# Patient Record
Sex: Female | Born: 1950 | Race: White | Hispanic: No | Marital: Married | Smoking: Never smoker
Health system: Southern US, Community
[De-identification: ages and names within clinical notes are randomized; demographics above are authoritative.]

## PROBLEM LIST (undated history)

## (undated) DIAGNOSIS — M8580 Other specified disorders of bone density and structure, unspecified site: Secondary | ICD-10-CM

## (undated) DIAGNOSIS — G43909 Migraine, unspecified, not intractable, without status migrainosus: Secondary | ICD-10-CM

## (undated) DIAGNOSIS — D72829 Elevated white blood cell count, unspecified: Secondary | ICD-10-CM

## (undated) DIAGNOSIS — Z85820 Personal history of malignant melanoma of skin: Secondary | ICD-10-CM

## (undated) DIAGNOSIS — F329 Major depressive disorder, single episode, unspecified: Secondary | ICD-10-CM

## (undated) DIAGNOSIS — I8390 Asymptomatic varicose veins of unspecified lower extremity: Secondary | ICD-10-CM

## (undated) DIAGNOSIS — I480 Paroxysmal atrial fibrillation: Secondary | ICD-10-CM

## (undated) DIAGNOSIS — R9439 Abnormal result of other cardiovascular function study: Secondary | ICD-10-CM

## (undated) DIAGNOSIS — I712 Thoracic aortic aneurysm, without rupture: Secondary | ICD-10-CM

## (undated) DIAGNOSIS — F419 Anxiety disorder, unspecified: Secondary | ICD-10-CM

## (undated) DIAGNOSIS — I10 Essential (primary) hypertension: Secondary | ICD-10-CM

## (undated) DIAGNOSIS — F32A Depression, unspecified: Secondary | ICD-10-CM

## (undated) DIAGNOSIS — I7789 Other specified disorders of arteries and arterioles: Secondary | ICD-10-CM

## (undated) DIAGNOSIS — D126 Benign neoplasm of colon, unspecified: Secondary | ICD-10-CM

## (undated) DIAGNOSIS — I351 Nonrheumatic aortic (valve) insufficiency: Secondary | ICD-10-CM

## (undated) DIAGNOSIS — E785 Hyperlipidemia, unspecified: Secondary | ICD-10-CM

## (undated) HISTORY — DX: Hyperlipidemia, unspecified: E78.5

## (undated) HISTORY — DX: Thoracic aortic aneurysm, without rupture: I71.2

## (undated) HISTORY — DX: Benign neoplasm of colon, unspecified: D12.6

## (undated) HISTORY — DX: Other specified disorders of bone density and structure, unspecified site: M85.80

## (undated) HISTORY — DX: Elevated white blood cell count, unspecified: D72.829

## (undated) HISTORY — PX: FEMORAL HERNIA REPAIR: SHX632

## (undated) HISTORY — DX: Anxiety disorder, unspecified: F41.9

## (undated) HISTORY — DX: Personal history of malignant melanoma of skin: Z85.820

## (undated) HISTORY — PX: DILATION AND CURETTAGE OF UTERUS: SHX78

## (undated) HISTORY — DX: Essential (primary) hypertension: I10

## (undated) HISTORY — DX: Depression, unspecified: F32.A

## (undated) HISTORY — PX: OTHER SURGICAL HISTORY: SHX169

## (undated) HISTORY — DX: Migraine, unspecified, not intractable, without status migrainosus: G43.909

## (undated) HISTORY — DX: Asymptomatic varicose veins of unspecified lower extremity: I83.90

## (undated) HISTORY — PX: CHOLECYSTECTOMY: SHX55

## (undated) HISTORY — PX: OVARIAN CYST REMOVAL: SHX89

## (undated) HISTORY — DX: Other specified disorders of arteries and arterioles: I77.89

## (undated) HISTORY — DX: Major depressive disorder, single episode, unspecified: F32.9

## (undated) HISTORY — DX: Paroxysmal atrial fibrillation: I48.0

---

## 2001-03-16 ENCOUNTER — Encounter: Payer: Self-pay | Admitting: Emergency Medicine

## 2001-03-16 ENCOUNTER — Emergency Department (HOSPITAL_COMMUNITY): Admission: EM | Admit: 2001-03-16 | Discharge: 2001-03-16 | Payer: Self-pay | Admitting: Emergency Medicine

## 2001-05-15 HISTORY — PX: MELANOMA EXCISION: SHX5266

## 2001-06-13 ENCOUNTER — Emergency Department (HOSPITAL_COMMUNITY): Admission: EM | Admit: 2001-06-13 | Discharge: 2001-06-13 | Payer: Self-pay | Admitting: Emergency Medicine

## 2002-01-08 ENCOUNTER — Other Ambulatory Visit: Admission: RE | Admit: 2002-01-08 | Discharge: 2002-01-08 | Payer: Self-pay | Admitting: *Deleted

## 2002-04-04 ENCOUNTER — Ambulatory Visit (HOSPITAL_BASED_OUTPATIENT_CLINIC_OR_DEPARTMENT_OTHER): Admission: RE | Admit: 2002-04-04 | Discharge: 2002-04-04 | Payer: Self-pay | Admitting: General Surgery

## 2002-05-26 ENCOUNTER — Ambulatory Visit (HOSPITAL_COMMUNITY): Admission: RE | Admit: 2002-05-26 | Discharge: 2002-05-26 | Payer: Self-pay | Admitting: *Deleted

## 2002-05-26 ENCOUNTER — Encounter (INDEPENDENT_AMBULATORY_CARE_PROVIDER_SITE_OTHER): Payer: Self-pay

## 2003-08-19 ENCOUNTER — Other Ambulatory Visit: Admission: RE | Admit: 2003-08-19 | Discharge: 2003-08-19 | Payer: Self-pay | Admitting: *Deleted

## 2007-10-14 ENCOUNTER — Encounter: Admission: RE | Admit: 2007-10-14 | Discharge: 2007-10-14 | Payer: Self-pay

## 2008-09-17 ENCOUNTER — Encounter: Payer: Self-pay | Admitting: Cardiovascular Disease

## 2009-08-19 ENCOUNTER — Encounter: Payer: Self-pay | Admitting: Cardiovascular Disease

## 2009-08-20 ENCOUNTER — Encounter: Payer: Self-pay | Admitting: Cardiovascular Disease

## 2009-09-07 ENCOUNTER — Encounter: Payer: Self-pay | Admitting: Cardiovascular Disease

## 2009-09-22 ENCOUNTER — Encounter: Payer: Self-pay | Admitting: Cardiovascular Disease

## 2009-10-08 DIAGNOSIS — M949 Disorder of cartilage, unspecified: Secondary | ICD-10-CM

## 2009-10-08 DIAGNOSIS — Z860100 Personal history of colon polyps, unspecified: Secondary | ICD-10-CM | POA: Insufficient documentation

## 2009-10-08 DIAGNOSIS — Z87898 Personal history of other specified conditions: Secondary | ICD-10-CM | POA: Insufficient documentation

## 2009-10-08 DIAGNOSIS — N943 Premenstrual tension syndrome: Secondary | ICD-10-CM | POA: Insufficient documentation

## 2009-10-08 DIAGNOSIS — M899 Disorder of bone, unspecified: Secondary | ICD-10-CM | POA: Insufficient documentation

## 2009-10-08 DIAGNOSIS — K219 Gastro-esophageal reflux disease without esophagitis: Secondary | ICD-10-CM | POA: Insufficient documentation

## 2009-10-08 DIAGNOSIS — F341 Dysthymic disorder: Secondary | ICD-10-CM

## 2009-10-12 ENCOUNTER — Telehealth (INDEPENDENT_AMBULATORY_CARE_PROVIDER_SITE_OTHER): Payer: Self-pay | Admitting: *Deleted

## 2009-10-12 ENCOUNTER — Ambulatory Visit: Payer: Self-pay | Admitting: Cardiovascular Disease

## 2009-10-12 DIAGNOSIS — I351 Nonrheumatic aortic (valve) insufficiency: Secondary | ICD-10-CM | POA: Insufficient documentation

## 2009-10-12 DIAGNOSIS — E785 Hyperlipidemia, unspecified: Secondary | ICD-10-CM

## 2009-10-12 DIAGNOSIS — I359 Nonrheumatic aortic valve disorder, unspecified: Secondary | ICD-10-CM | POA: Insufficient documentation

## 2009-10-12 DIAGNOSIS — I10 Essential (primary) hypertension: Secondary | ICD-10-CM | POA: Insufficient documentation

## 2009-10-12 DIAGNOSIS — E7800 Pure hypercholesterolemia, unspecified: Secondary | ICD-10-CM | POA: Insufficient documentation

## 2009-10-15 ENCOUNTER — Telehealth: Payer: Self-pay | Admitting: Cardiovascular Disease

## 2009-10-28 ENCOUNTER — Telehealth: Payer: Self-pay | Admitting: Cardiovascular Disease

## 2009-11-02 ENCOUNTER — Encounter: Payer: Self-pay | Admitting: Cardiovascular Disease

## 2009-11-02 ENCOUNTER — Ambulatory Visit: Payer: Self-pay

## 2009-11-02 ENCOUNTER — Ambulatory Visit (HOSPITAL_COMMUNITY): Admission: RE | Admit: 2009-11-02 | Discharge: 2009-11-02 | Payer: Self-pay | Admitting: Cardiovascular Disease

## 2009-11-02 ENCOUNTER — Ambulatory Visit: Payer: Self-pay | Admitting: Internal Medicine

## 2009-11-09 ENCOUNTER — Ambulatory Visit: Payer: Self-pay | Admitting: Cardiovascular Disease

## 2009-12-07 ENCOUNTER — Telehealth: Payer: Self-pay | Admitting: Cardiovascular Disease

## 2009-12-13 ENCOUNTER — Telehealth: Payer: Self-pay | Admitting: Cardiovascular Disease

## 2010-03-02 ENCOUNTER — Encounter: Payer: Self-pay | Admitting: Cardiovascular Disease

## 2010-04-13 ENCOUNTER — Telehealth: Payer: Self-pay | Admitting: Cardiovascular Disease

## 2010-06-14 NOTE — Assessment & Plan Note (Signed)
Summary: 1 MONTH ROV.SL   Visit Type:  1 mo f/u Primary Provider:  Merri Brunette  CC:  fatigue....Marland Kitchenno other complaints today.  History of Present Illness: 60 yo WF with history of HTN, hyperlipidemia and mild to moderate aortic valve insufficiency with possible bicuspid aortic valve here today to establish cardiology care. She was followed by Dr. Aleen Campi in the past and was found in 2010 to have the moderate AI. She currently  has no symptoms. She denies chest pain, SOB, dizziness, near syncope or syncope. She underwent a treadmill stress in [facility] and had ST depression at baseline that worsened with exercise. This was in May 2010. She is concerned about this. No further workup was recommended by Dr. Aleen Campi  based on the testing. She had no chest pain during exercise and has had no chest pain since. 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 I ordered an echo at the last visit and she is here today for follow up. Her echo is outlined in detail below. Normal LV systolic function with mild aortic valve prolapse and mild AI. She has had recent leg cramps on Zocor and stopped the Zocor. Her leg cramps improved. We have arranged for her to start Crestor 5 mg at night. She is given samples today. No chest pain, SOB, near syncope or syncope.   Current Medications (verified): 1)  Vitamin D 1000 Unit Tabs (Cholecalciferol) .Marland Kitchen.. 1 Tab Two Times A Day 2)  Zomig 2.5 Mg Tabs (Zolmitriptan) .Marland Kitchen.. 1 Tab As Needed 3)  Aspirin 81 Mg Tbec (Aspirin) .... Take One Tablet By Mouth Daily 4)  Fish Oil 1000 Mg Caps (Omega-3 Fatty Acids) .Marland Kitchen.. 1 Cap Once Daily 5)  Diovan 80 Mg Tabs (Valsartan) .Marland Kitchen.. 1 Tab Once Daily 6)  Multivitamins   Tabs (Multiple Vitamin) .Marland Kitchen.. 1 Tab Once Daily 7)  Calcium-Vitamin D 600-200 Mg-Unit Tabs (Calcium-Vitamin D) .... 3 Tabs Once Daily 8)  Advil 200 Mg Tabs (Ibuprofen) .... As Needed 9)   Tylenol Extra Strength 500 Mg Tabs (Acetaminophen) .... As Needed  Allergies (verified): No Known Drug Allergies  Past History:  Past Medical History: Migraines Anxiety/depression Adenomatous colon polyp Osteopenia Melanoma history followed w/  Dr. Danella Deis Valvular heart disease.Marland KitchenMarland KitchenMild to moderate aortic insufficiency with possible bicuspid aortic valve Vericose veins Mild Leukocytosis HTN Hyperlipidemia Mild aortic valve insufficiency  Social History: Reviewed history from 10/12/2009 and no changes required. Full Time-interior Estate agent, freelance Married, 1 daughter Tobacco Use - No.  Alcohol Use - yes, 1 glass wine per week Regular Exercise - yes, walking, yoga, weight lifting Drug Use - no  Review of Systems       The patient complains of fatigue.  The patient denies malaise, fever, weight gain/loss, vision loss, decreased hearing, hoarseness, chest pain, palpitations, shortness of breath, prolonged cough, wheezing, sleep apnea, coughing up blood, abdominal pain, blood in stool, nausea, vomiting, diarrhea, heartburn, incontinence, blood in urine, muscle weakness, joint pain, leg swelling, rash, skin lesions, headache, fainting, dizziness, depression, anxiety, enlarged lymph nodes, easy bruising or bleeding, and environmental allergies.    Vital Signs:  Patient profile:   60 year old female Height:      69 inches Weight:      153 pounds BMI:     22.68 Pulse rate:   76 / minute Pulse rhythm:   regular BP sitting:   126 / 74  (left arm) Cuff size:   regular  Vitals Entered By: Danielle Rankin, CMA (November 09, 2009 12:14 PM)  Physical Exam  General:  General: Well developed, well nourished, NAD Musculoskeletal: Muscle strength 5/5 all ext Psychiatric: Mood and affect normal Neck: No JVD, no carotid bruits, no thyromegaly, no lymphadenopathy. Lungs:Clear bilaterally, no wheezes, rhonci, crackles CV: RRR no murmurs, gallops rubs Abdomen: soft, NT, ND, BS  present Extremities: No edema, pulses 2+.    Echocardiogram  Procedure date:  11/02/2009  Findings:      - Left ventricle: The cavity size was normal. Wall thickness was       normal. Systolic function was normal. The estimated ejection       fraction was in the range of 60% to 65%. Features are consistent       with a pseudonormal left ventricular filling pattern, with       concomitant abnormal relaxation and increased filling pressure       (grade 2 diastolic dysfunction).     - Aortic valve: Mild prolapse. Mild regurgitation.     - Mitral valve: Mild prolapse, involving the posterior leaflet.     - Left atrium: The atrium was mildly dilated.  Impression & Recommendations:  Problem # 1:  AORTIC INSUFFICIENCY (ICD-424.1) Mild AI by echo. Valve appears tricuspid. Repeat in one year. I spent 20 minutes today discussing the nature of AI and potential for progression. She has multiple questions.   Her updated medication list for this problem includes:    Diovan 80 Mg Tabs (Valsartan) .Marland Kitchen... 1 tab once daily  Problem # 2:  HYPERLIPIDEMIA-MIXED (ICD-272.4) Well controlled before stopping Zocor. Will try Crestor 5 mg at night.(samples given today).  If she has legs cramps, will stop. Check FLP, LFTs in 12 weeks.   Problem # 3:  HYPERTENSION, BENIGN (ICD-401.1) Well controlled on current therapy.   Her updated medication list for this problem includes:    Aspirin 81 Mg Tbec (Aspirin) .Marland Kitchen... Take one tablet by mouth daily    Diovan 80 Mg Tabs (Valsartan) .Marland Kitchen... 1 tab once daily  Patient Instructions: 1)  Your physician recommends that you schedule a follow-up appointment in: 1 year 2)  Your physician recommends that you return for a FASTING lipid profile and liver profile in 12 weeks. 3)  Your physician has requested that you have an echocardiogram.  Echocardiography is a painless test that uses sound waves to create images of your heart. It provides your doctor with information about  the size and shape of your heart and how well your heart's chambers and valves are working.  This procedure takes approximately one hour. There are no restrictions for this procedure. To be done in 1 year--1-2 weeks prior to appt. with Dr.Kerisha Goughnour

## 2010-06-14 NOTE — Progress Notes (Signed)
Summary: sided effect of crestor  Phone Note Call from Patient Call back at [facility] Phone [phone]   Caller: Patient Reason for Call: Talk to Nurse Summary of Call: just started CRESTOR 5 MG TABS 1 tab at bedtime.  c/o sided effect rash, not up to par.  Initial call taken by: [name],  December 13, 2009 11:09 AM  Follow-up for Phone Call        broke out in rash (inside of knees and elbow)  started 5 days ago.  Started 4 weeks after starting Crestor.  Rash no worse or better over the last couple of days just stays the same. Told pt she can hold the Crestor for several days and see if the rash gets any better.  She will call us back and let us know if the rash gets any better with holding Crestor. Dennis Bast, RN, BSN  December 13, 2009 12:23 PM  Additional Follow-up for Phone Call Additional follow up Details #1::        Agree. Dennie Bible, can we follow up with her by phone later this week to see how she is? cdm Additional Follow-up by: Verne Carrow, MD,  December 14, 2009 8:39 AM    Additional Follow-up for Phone Call Additional follow up Details #2::    left message to call back Dossie Arbour, RN, BSN  December 16, 2009 4:22 PM Spoke with pt. Rash has improved slightly--is on legs,calves and top of thighs. No other medicine changes recently.  She has appt with Dr. Elenor Quinones MD) on Monday to have this evaluated. I asked her to continue to hold Crestor and let us know outcome of appt with Dr. Dalene Carrow, RN, BSN  December 17, 2009 12:28 PM Received call from pt. She saw Dr. Renne Crigler today. Rash continuing to improve and Crestor was stopped and she was started on Lipitor 5 mg by mouth daily.  She will  keep appt with [place] for lipid and liver profile on 02/08/10

## 2010-06-14 NOTE — Letter (Signed)
Summary: [facility] Office Note  [facility] Office Note   Imported By: Roderic Ovens 10/18/2009 11:51:25  _____________________________________________________________________  External Attachment:    Type:   Image     Comment:   External Document

## 2010-06-14 NOTE — Progress Notes (Signed)
Summary: [facility] Exercise [place] Test   [facility] Exercise [place] Test   Imported By: Roderic Ovens 10/18/2009 11:46:36  _____________________________________________________________________  External Attachment:    Type:   Image     Comment:   External Document

## 2010-06-14 NOTE — Progress Notes (Signed)
Summary: lab result from dr. Renne Crigler  Phone Note Call from Patient Call back at Home Phone [phone]   Caller: Patient Reason for Call: Talk to Nurse Summary of Call: have you receive the lab results from dr. Renne Crigler - wants to discuss.  Initial call taken by: [name],  April 13, 2010 9:53 AM  Follow-up for Phone Call        Pt. wanted to know if we recieved lab results from [facility]. I informed her that we did & Dr. Clifton James did and the results look good. [name] RN  April 13, 2010 10:16 AM  Follow-up by: [name] RN,  April 13, 2010 10:16 AM

## 2010-06-14 NOTE — Progress Notes (Signed)
Summary: Calling regarding Simvastatin  Phone Note Call from Patient Call back at Home Phone [phone]   Caller: Patient Summary of Call: Pt calling regarding taking Simvastatin Initial call taken by: Judie Grieve,  October 28, 2009 11:11 AM  Follow-up for Phone Call        Pt. reports leg pain has improved since stopping simvastatin. Has been on simvastatin for a year and has not been on any other statins.  Will discuss with Dr. Clifton James. Dossie Arbour, RN, BSN  October 28, 2009 11:36 AM   Additional Follow-up for Phone Call Additional follow up Details #1::        We can try Crestor 5 mg per day. We have samples she can try. cdm Additional Follow-up by: Verne Carrow, MD,  October 28, 2009 4:36 PM    Additional Follow-up for Phone Call Additional follow up Details #2::    Per Dr. Clifton James will also need lipid and liver profile in 12 weeks. Left message for pt to call back Dossie Arbour, RN, BSN  October 28, 2009 5:54 PM  pt returning call, [phone] [address]  November 03, 2009 10:30 AM   pt aware, samples will be left for pt at front desk if she tolerates med ok will call back to get new prescription adn sch labwork Meredith Staggers, RN  November 03, 2009 11:26 AM

## 2010-06-14 NOTE — Progress Notes (Signed)
Summary: side effect to meds-simvastin  Phone Note Call from Patient Call back at Home Phone [phone]   Caller: Patient Reason for Call: Talk to Nurse Summary of Call: c/o side effect to simvastin . muscle ache in both legs.  Initial call taken by: [name],  October 15, 2009 9:56 AM  Follow-up for Phone Call        discussed with pt by telephone--she states she has been having some pain in her legs from her knees down to her feet-she states she talked with Dr Clifton James about this a little at time of OV this week but she told him she did not think it was the Simvastatin-she thought it might be from [place] has been on Simvastatin since August-the leg pain  bothered her so much last night she had a hard time sleeping--she has reconsidered and thinks the leg pain may be related to Simvastatin--she will hold Simvastatin 7-10 days and will call back either 6-10 or 6-13 to report how she is feeling and if her leg pain has resolved--We discussed the importance of following up after holding the Simvastatin so Dr Clifton James could make a recommendation about alternate treatment -I will forward to Dr Clifton James for review    New/Updated Medications: * SIMVASTATIN 10 MG TABS (SIMVASTATIN)ON HOLD 10-15-09 1 tab at bedtime

## 2010-06-14 NOTE — Progress Notes (Signed)
Summary: refill request  Phone Note Call from Patient   Caller: Patient Reason for Call: Talk to Nurse Summary of Call: pt finished sample pack of crestor, wants rx at [facility] Initial call taken by: Glynda Jaeger,  December 07, 2009 2:42 PM    New/Updated Medications: CRESTOR 5 MG TABS (ROSUVASTATIN CALCIUM) 1 tab at bedtime Prescriptions: CRESTOR 5 MG TABS (ROSUVASTATIN CALCIUM) 1 tab at bedtime  #30 x 11   Entered by:   Danielle Rankin, CMA   Authorized by:   Verne Carrow, MD   Signed by:   Danielle Rankin, CMA on 12/07/2009   Method used:   Electronically to        [facility]. [phone]* (retail)       [id number]       [place], [place]  [address]       Ph: [phone]       Fax: [phone]   RxID:   [id number]

## 2010-06-14 NOTE — Progress Notes (Signed)
  Recieved Records from [facility] on Pt..took to [employer]  Oct 12, 2009 1:15 PM

## 2010-06-14 NOTE — Assessment & Plan Note (Signed)
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  History of Present Illness: 60 yo WF with history of HTN, hyperlipidemia and mild to moderate aortic valve insufficiency with possible bicuspid aortic valve here today to establish cardiology care. She was followed by Dr. Aleen Campi in the past and was found in 2010 to have the moderate AI. She currently  has no symptoms. She denies chest pain, SOB, dizziness, near syncope or syncope. She underwent a treadmill stress in [facility] and had ST depression at baseline that worsened with exercise. This was in May 2010. She is concerned about this. No further workup was recommended by Dr. Aleen Campi  based on the testing. She had no chest pain during exercise and has had no chest pain since.   Old records reviewed. LDL 83 (down from 142 on Zocor). HDL 73, Total chol 174.  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   Current Medications (verified): 1)  Vitamin D 1000 Unit Tabs (Cholecalciferol) .Marland Kitchen.. 1 Tab Two Times A Day 2)  Zomig 2.5 Mg Tabs (Zolmitriptan) .Marland Kitchen.. 1 Tab As Needed 3)  Aspirin 81 Mg Tbec (Aspirin) .... Take One Tablet By Mouth Daily 4)  Fish Oil 1000 Mg Caps (Omega-3 Fatty Acids) .Marland Kitchen.. 1 Cap Once Daily 5)  Diovan 80 Mg Tabs (Valsartan) .Marland Kitchen.. 1 Tab Once Daily 6)  Simvastatin 10 Mg Tabs (Simvastatin) .Marland Kitchen.. 1 Tab At Bedtime 7)  Multivitamins   Tabs (Multiple Vitamin) .Marland Kitchen.. 1 Tab Once Daily 8)  Calcium-Vitamin D 600-200 Mg-Unit Tabs (Calcium-Vitamin D) .... 3 Tabs Once Daily 9)  Advil 200 Mg Tabs (Ibuprofen) .... As Needed 10)  Tylenol Extra Strength 500 Mg Tabs (Acetaminophen) .... As Needed  Allergies (verified): No Known Drug Allergies  Past History:  Past Medical  History: Migraines Anxiety/depression Adenomatous colon polyp Osteopenia Melanoma history followed w/  Dr. Danella Deis Valvular heart disease.Marland KitchenMarland KitchenMild to moderate aortic insufficiency with possible bicuspid aortic valve Vericose veins Mild Leukocytosis HTN Hyperlipidemia  Past Surgical History:  Dilation and curettage.. High endocervical polyp removed Cholecystectomy Herniorrhaphy Ovarian cyst removal  BTL Melanoma removal  Family History: Family History of Hypertension: Mother, father Mother: Alive, age 83. Migraines Father deceased age 52 lung cancer Siblings: 2 sisters alive and well, both younger  Social History: Estate agent, freelance Married, 1 daughter Tobacco Use - No.  Alcohol Use - yes, 1 glass wine per week Regular Exercise - yes, walking, yoga, weight lifting Drug Use - no  Review of Systems  The patient denies fatigue, malaise, fever, weight gain/loss, vision loss, decreased hearing, hoarseness, chest pain, palpitations, shortness of breath, prolonged cough, wheezing, sleep apnea, coughing up blood, abdominal pain, blood in stool, nausea, vomiting, diarrhea, heartburn, incontinence, blood in urine, muscle weakness, joint pain, leg swelling, rash, skin lesions, headache, fainting, dizziness, depression, anxiety, enlarged lymph nodes, easy bruising or bleeding, and environmental allergies.    Vital Signs:  Patient profile:   60 year old female Height:      69 inches Weight:      155 pounds BMI:     22.97 Pulse rate:   81 / minute Pulse rhythm:   irregular BP sitting:   122 / 84  (left arm) Cuff size:   large  Vitals Entered By: Danielle Rankin, CMA (Oct 12, 2009 3:14  PM)  Physical Exam  General:  General: Well developed, well nourished, NAD HEENT: OP clear, mucus membranes moist SKIN: warm, dry Neuro: No focal deficits Musculoskeletal: Muscle strength 5/5 all ext Psychiatric: Mood and affect normal Neck: No JVD, no carotid bruits, no  thyromegaly, no lymphadenopathy. Lungs:Clear bilaterally, no wheezes, rhonci, crackles CV: RRR no murmurs, gallops rubs Abdomen: soft, NT, ND, BS present Extremities: No edema, pulses 2+.    EKG  Procedure date:  10/12/2009  Findings:      NSR, rate 81 bpm. Possible right atrial enlargement. Poor R wave progression through precordial leads.   Impression & Recommendations:  Problem # 1:  AORTIC INSUFFICIENCY (ICD-424.1) Moderate by echo May 2010 in Dr. Star Age office. No loud diastolic murmur. Will repeat echo to assess.   Her updated medication list for this problem includes:    Diovan 80 Mg Tabs (Valsartan) .Marland Kitchen... 1 tab once daily  Orders: EKG w/ Interpretation (93000) Echocardiogram (Echo)  Problem # 2:  HYPERTENSION, BENIGN (ICD-401.1) Well controlled. Continue current therapy.   Her updated medication list for this problem includes:    Aspirin 81 Mg Tbec (Aspirin) .Marland Kitchen... Take one tablet by mouth daily    Diovan 80 Mg Tabs (Valsartan) .Marland Kitchen... 1 tab once daily  Problem # 3:  HYPERLIPIDEMIA-MIXED (ICD-272.4) Well controlled. See HPI for latest results.   Her updated medication list for this problem includes:    Simvastatin 10 Mg Tabs (Simvastatin) .Marland Kitchen... 1 tab at bedtime  Patient Instructions: 1)  Your physician recommends that you schedule a follow-up appointment in: 3 weeks 2)  Your physician has requested that you have an echocardiogram.  Echocardiography is a painless test that uses sound waves to create images of your heart. It provides your doctor with information about the size and shape of your heart and how well your heart's chambers and valves are working.  This procedure takes approximately one hour. There are no restrictions for this procedure.

## 2010-09-30 NOTE — Op Note (Signed)
NAME:  Kristine Jones, Kristine Jones                      ACCOUNT NO.:  [id number]   MEDICAL RECORD NO.:  [id number]                   PATIENT TYPE:  AMB   LOCATION:  DAY                                  FACILITY:  [facility]   PHYSICIAN:  Pershing Cox, M.D.            DATE OF BIRTH:  November 17, 1950   DATE OF PROCEDURE:  05/26/2002  DATE OF DISCHARGE:                                 OPERATIVE REPORT   PREOPERATIVE DIAGNOSIS:  High endocervical polyp.   POSTOPERATIVE DIAGNOSIS:  High endocervical polyp.   PROCEDURE:  1. Exam under anesthesia.  2. Dilation and curettage and diagnostic hysteroscopy.   ANESTHESIA:  General endotracheal.   SURGEON:  Pershing Cox, M.D.   INDICATIONS FOR PROCEDURE:  Kristine Jones was seen for her annual  examination in August.  She then returned to our office in November.  In the  interim, she had had a melanoma excised and was extremely anxious.  I  attempted to remove the endocervical polyp under colposcopic guidance, but  the polyp was higher in the endocervix than could be removed.  I recommended  that she have a D&C hysteroscopy.  She is scheduled today for this  procedure.   FINDINGS:  Endocervical polyp is visualized in the low endocervix but  arising from the higher endocervix.  The endometrial canal sounds 9.75  inches.  Both ostia were visualized.  There was a small amount of tissue  seen in the upper fundus of the uterus.   DESCRIPTION OF PROCEDURE:  Kristine Jones was brought to the operating  room with an IV in place.  She received 1 g of Ancef in the holding area.  She was taken to the operating room where she received sedation by  intravenous route.  She was heavily sedated before she was placed into Allen  stirrups.  The patient's perineum, abdomen, and vagina were prepped with a  solution of Hibiclens.  The bladder was emptied using sterile technique and  a red rubber catheter.  The exam under anesthesia showed the uterus to be  approximately 8-10 weeks in size and midplane.  No adnexal masses.   The patient was draped for a sterile vaginal procedure.  Bivalve speculum  was placed in the vagina.  Marcaine 25% was injected into the anterior  cervix which was then grasped with a single-tooth tenaculum.  Marcaine  paracervical block was administered to the 3, 4, 7, and 8 positions, using a  total volume of 12 cubic centimeters of the solution.  Endocervical  curettings were obtained using a box curette.  Attempt to remove the polyp  was unsuccessful using polyp forceps.  The cervix was dilated with Pratt  dilators to size 25.  The hysteroscope was introduced into the endometrial  cavity, and the endometrial contents were visualized and photographs were  taken.   The hysteroscope was removed, and the endocervical canal was vigorously  curetted using a serrated Meigs curette.  The polyp was retrieved using  this, and this was confirmed by using polyp forceps.  A small sharp curette  was used to curette the endometrial curettings from all surfaces of the  endometrium.  These were collected onto Telfa.  Hysteroscope was then  introduced into the cavity again to verify the total removal of the cervical  polyp.  Once this  was confirmed, the procedure was terminated.  Specimens include endocervical  curettings, endometrial curettings, and cervical polyp.  Complications none.  Deficit was less than 50 cubic centimeters.  The patient tolerated the  procedure well.                                               Pershing Cox, M.D.    MAJ/MEDQ  D:  05/26/2002  T:  05/26/2002  Job:  [id number]   cc:   Soyla Murphy. Renne Crigler, M.D.  [address]  [place]  [place] [address]  Fax: [phone]

## 2010-10-20 ENCOUNTER — Encounter: Payer: Self-pay | Admitting: Cardiovascular Disease

## 2010-10-27 ENCOUNTER — Encounter: Payer: Self-pay | Admitting: Cardiology

## 2010-11-09 ENCOUNTER — Telehealth: Payer: Self-pay | Admitting: Cardiovascular Disease

## 2010-11-09 DIAGNOSIS — I351 Nonrheumatic aortic (valve) insufficiency: Secondary | ICD-10-CM

## 2010-11-09 NOTE — Telephone Encounter (Signed)
Pt calling re needing en echo before next  appt or not

## 2010-11-09 NOTE — Telephone Encounter (Signed)
Dr Gibson Ramp note 11/09/09 indicates pt should have echo in 1 year  1-2 weeks prior to appt with him in 1 year. I will forward this to Glynda Jaeger to schedule.  Echo order has been placed in EPIC.

## 2010-11-15 ENCOUNTER — Ambulatory Visit (HOSPITAL_COMMUNITY): Payer: BC Managed Care – PPO | Attending: Cardiovascular Disease | Admitting: Radiology

## 2010-11-15 DIAGNOSIS — I079 Rheumatic tricuspid valve disease, unspecified: Secondary | ICD-10-CM | POA: Insufficient documentation

## 2010-11-15 DIAGNOSIS — E785 Hyperlipidemia, unspecified: Secondary | ICD-10-CM | POA: Insufficient documentation

## 2010-11-15 DIAGNOSIS — I10 Essential (primary) hypertension: Secondary | ICD-10-CM | POA: Insufficient documentation

## 2010-11-15 DIAGNOSIS — I379 Nonrheumatic pulmonary valve disorder, unspecified: Secondary | ICD-10-CM | POA: Insufficient documentation

## 2010-11-15 DIAGNOSIS — I080 Rheumatic disorders of both mitral and aortic valves: Secondary | ICD-10-CM | POA: Insufficient documentation

## 2010-11-15 DIAGNOSIS — I351 Nonrheumatic aortic (valve) insufficiency: Secondary | ICD-10-CM

## 2010-11-15 DIAGNOSIS — I359 Nonrheumatic aortic valve disorder, unspecified: Secondary | ICD-10-CM

## 2010-11-24 ENCOUNTER — Ambulatory Visit (INDEPENDENT_AMBULATORY_CARE_PROVIDER_SITE_OTHER): Payer: BC Managed Care – PPO | Admitting: Cardiovascular Disease

## 2010-11-24 ENCOUNTER — Encounter: Payer: Self-pay | Admitting: Cardiovascular Disease

## 2010-11-24 VITALS — BP 154/92 | HR 67 | Ht 69.0 in | Wt 157.0 lb

## 2010-11-24 DIAGNOSIS — I359 Nonrheumatic aortic valve disorder, unspecified: Secondary | ICD-10-CM

## 2010-11-24 NOTE — Progress Notes (Signed)
History of Present Illness:59 yo WF with history of HTN, hyperlipidemia and mild to moderate aortic valve insufficiency with possible bicuspid aortic valve here today for cardiac follow up. She was followed by Dr. Aleen Campi in the past and was found in 2010 to have the moderate AI. I saw her as a new pt in 2011. She currently  has no symptoms. She denies chest pain, SOB, dizziness, near syncope or syncope. She underwent a treadmill stress in [facility] and had ST depression at baseline that worsened with exercise. This was in May 2010. She was concerned about this. No further workup was recommended by Dr. Aleen Campi  based on the testing. She had no chest pain during exercise and has had no chest pain since. 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 Echo in our office 2011 with normal LV systolic function with mild aortic valve prolapse and mild AI. She has had recent leg cramps on Zocor and stopped the Zocor. Her leg cramps improved. We have arranged for her to start Crestor 5 mg at night. She did not tolerate this but is now on Lipitor now. Echo last week with stable mild to moderate AI, normal LV size and function.   She has been doing well. No chest pain, SOB. Occasional palpitations.   Dr. Renne Crigler is her primary care physician. Last LDL of 93, HDL 91, Total chol 195 in June 2012.   Past Medical History  Diagnosis Date  . Migraines   . Anxiety and depression   . Adenomatous colon polyp   . Osteopenia   . History of melanoma     Followed with Dr. Danella Deis  . Valvular heart disease     Mild to moderate aortic insufficiency with possible bicuspid aortic valve  . Varicose veins   . Leukocytosis     Mild  . HTN (hypertension)   . Hyperlipidemia   . Aortic valve insufficiency     Mild    Past Surgical History  Procedure Date  . Dilation and curettage of uterus   . High endocervical polyp  removed   . Cholecystectomy   . Femoral hernia repair   . Ovarian cyst removal   . Btl   . Melanoma excision     Current Outpatient Prescriptions  Medication Sig Dispense Refill  . acetaminophen (TYLENOL) 500 MG tablet Take 500 mg by mouth as needed.        Marland Kitchen aspirin 81 MG EC tablet Take 81 mg by mouth daily.        . Calcium Carbonate-Vitamin D (CALCIUM-VITAMIN D3) 600-200 MG-UNIT TABS Take 3 tablets by mouth daily.        . Cholecalciferol (VITAMIN D) 1000 UNITS capsule Take 1,000 Units by mouth daily.       Marland Kitchen ESTRACE VAGINAL 0.1 MG/GM vaginal cream as needed.      . fish oil-omega-3 fatty acids 1000 MG capsule Take 1 g by mouth daily.        Marland Kitchen ibuprofen (ADVIL,MOTRIN) 200 MG tablet Take 200 mg by mouth as needed.        Marland Kitchen LIPITOR 10 MG tablet TAKE ONE HALF TABLET DAILY      . multivitamin (THERAGRAN) per tablet Take 1 tablet by mouth daily.        . valsartan (DIOVAN) 160 MG tablet Take 80 mg by mouth daily.        Marland Kitchen ZOLMitriptan (ZOMIG) 2.5 MG  tablet Take 2.5 mg by mouth as needed.          No Known Allergies  History   Social History  . Marital Status: Married    Spouse Name: N/A    Number of Children: N/A  . 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 Smoking status: Never Smoker   . Smokeless tobacco: Not on file  . Alcohol Use: 0.5 oz/week    1 drink(s) per week     1 glass of wine per week  . Drug Use: No  . Sexually Active: Not on file   Other Topics Concern  . Not on file   Social History Narrative   Married1 daughterGets regular exercise: Walking, yoga, weight lifting    Family History  Problem Relation Age of Onset  . Hypertension Mother   . Migraines Mother   . Hypertension Father   . Lung cancer Father     Review of Systems:  As stated in the HPI and otherwise negative.   BP 154/92  Pulse 67  Ht 5\' 9"  (1.753 m)  Wt 157 lb (71.215 kg)  BMI 23.18 kg/m2  Physical  Examination: General: Well developed, well nourished, NAD HEENT: OP clear, mucus membranes moist SKIN: warm, dry. No rashes. Neuro: No focal deficits Musculoskeletal: Muscle strength 5/5 all ext Psychiatric: Mood and affect normal Neck: No JVD, no carotid bruits, no thyromegaly, no lymphadenopathy. Lungs:Clear bilaterally, no wheezes, rhonci, crackles Cardiovascular: Regular rate and rhythm. No murmurs, gallops or rubs. Abdomen:Soft. Bowel sounds present. Non-tender.  Extremities: No lower extremity edema. Pulses are 2 + in the bilateral DP/PT.  EKG:NSR, rate 67 bpm.   Echo: 11/19/10:  Left ventricle: The cavity size was normal. Wall thickness was normal. Systolic function was normal. The estimated ejection fraction was in the range of 60% to 65%. Wall motion was normal; there were no regional wall motion abnormalities. Doppler parameters are consistent with abnormal left ventricular relaxation (grade 1 diastolic dysfunction). - Aortic valve: Mild to moderate regurgitation. - Mitral valve: Mild prolapse, involving the posterior leaflet. Mild regurgitation.

## 2010-11-24 NOTE — Assessment & Plan Note (Signed)
Stable. Recent echo with Mild to Moderate AI. Continue ARB. No other changes. Repeat echo in two years.

## 2010-11-24 NOTE — Patient Instructions (Signed)
Your physician wants you to follow-up in: 12 months with Dr. McAlhany.  You will receive a reminder letter in the mail two months in advance. If you don't receive a letter, please call our office to schedule the follow-up appointment.   

## 2011-02-22 ENCOUNTER — Telehealth: Payer: Self-pay | Admitting: Cardiovascular Disease

## 2011-02-22 NOTE — Telephone Encounter (Signed)
Pt calling re question on med °

## 2011-02-22 NOTE — Telephone Encounter (Signed)
Spoke with pt who is asking if change from Lipitor to generic atorvastatin may have affected last lipid profile results.  I told her that the change to generic should not have affected results.

## 2011-04-19 ENCOUNTER — Encounter: Payer: Self-pay | Admitting: Cardiovascular Disease

## 2011-11-22 ENCOUNTER — Telehealth: Payer: Self-pay | Admitting: Cardiovascular Disease

## 2011-11-22 NOTE — Telephone Encounter (Signed)
Patient would like to know if she should have an echo before yrly  Appnt.

## 2011-11-22 NOTE — Telephone Encounter (Signed)
I spoke with pt and told her she would not need echo prior to appt in August.  Last echo done in July 2012 with follow up planned in 2 years

## 2011-12-29 ENCOUNTER — Encounter: Payer: Self-pay | Admitting: Cardiovascular Disease

## 2011-12-29 ENCOUNTER — Ambulatory Visit (INDEPENDENT_AMBULATORY_CARE_PROVIDER_SITE_OTHER): Payer: BC Managed Care – PPO | Admitting: Cardiovascular Disease

## 2011-12-29 VITALS — BP 130/76 | HR 76 | Ht 69.0 in | Wt 159.0 lb

## 2011-12-29 DIAGNOSIS — I359 Nonrheumatic aortic valve disorder, unspecified: Secondary | ICD-10-CM

## 2011-12-29 DIAGNOSIS — I351 Nonrheumatic aortic (valve) insufficiency: Secondary | ICD-10-CM

## 2011-12-29 NOTE — Assessment & Plan Note (Signed)
She has mild to moderate AI by echo August 2012 with normal LV size and function. Will repeat echo in January 2014. No symptoms. BP well controlled.

## 2011-12-29 NOTE — Patient Instructions (Addendum)
Your physician wants you to follow-up in: 12 months. You will receive a reminder letter in the mail two months in advance. If you don't receive a letter, please call our office to schedule the follow-up appointment.  Your physician has requested that you have an echocardiogram. Echocardiography is a painless test that uses sound waves to create images of your heart. It provides your doctor with information about the size and shape of your heart and how well your heart's chambers and valves are working. This procedure takes approximately one hour. There are no restrictions for this procedure. To be done in January 2014

## 2011-12-29 NOTE — Progress Notes (Signed)
History of Present Illness: 61 yo WF with history of HTN, hyperlipidemia and mild to moderate aortic valve insufficiency with possible bicuspid aortic valve here today for cardiac follow up. She was followed by Dr. Aleen Campi in the past and was found in 2010 to have the moderate AI. I saw her as a new pt in 2011. She has had no symptoms.  She underwent a treadmill stress in [facility] and had ST depression at baseline that worsened with exercise. This was in May 2010. She was concerned about this. No further workup was recommended by Dr. Aleen Campi based on the testing. She had no chest pain during exercise and has had no chest pain since. She did not tolerate Zocor or Crestor but she has tolerated Lipitor. Echo August 2012  with stable mild to moderate AI, normal LV size and function, mild MR. Her diovan was stopped because she read   She has been doing well. No chest pain, SOB or palpitations.   Dr. Renne Crigler is her primary care physician. Lipids are followed in primary care. Last lipids 10/26/11 Total chol: 182, HDL: 80, LDL: 87.   Past Medical History  Diagnosis Date  . Migraines   . Anxiety and depression   . Adenomatous colon polyp   . Osteopenia   . History of melanoma     Followed with Dr. Danella Deis  . Valvular heart disease     Mild to moderate aortic insufficiency with possible bicuspid aortic valve  . Varicose veins   . Leukocytosis     Mild  . HTN (hypertension)   . Hyperlipidemia   . Aortic valve insufficiency     Mild    Past Surgical History  Procedure Date  . Dilation and curettage of uterus   . High endocervical polyp removed   . Cholecystectomy   . Femoral hernia repair   . Ovarian cyst removal   . Btl   . Melanoma excision     Current Outpatient Prescriptions  Medication Sig Dispense Refill  . acetaminophen (TYLENOL) 500 MG tablet Take 500 mg by mouth as needed.        Marland Kitchen aspirin 81 MG EC tablet Take 81 mg by mouth daily.        . Calcium  Carbonate-Vitamin D (CALCIUM-VITAMIN D3) 600-200 MG-UNIT TABS Take 3 tablets by mouth daily.        . Cholecalciferol (VITAMIN D) 1000 UNITS capsule Take 1,000 Units by mouth daily.       Marland Kitchen ESTRACE VAGINAL 0.1 MG/GM vaginal cream as needed.      . fish oil-omega-3 fatty acids 1000 MG capsule Take 1 g by mouth daily.        Marland Kitchen ibuprofen (ADVIL,MOTRIN) 200 MG tablet Take 200 mg by mouth as needed.        Marland Kitchen LIPITOR 10 MG tablet TAKE ONE HALF TABLET DAILY      . multivitamin (THERAGRAN) per tablet Take 1 tablet by mouth daily.        Marland Kitchen triamterene-hydrochlorothiazide (DYAZIDE) 37.5-25 MG per capsule 1 tab daily      . ZOLMitriptan (ZOMIG) 2.5 MG tablet Take 2.5 mg by mouth as needed.          No Known Allergies  History   Social History  . Marital Status: Married    Spouse Name: N/A    Number of Children: N/A  . 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 Smoking status: Never Smoker   . Smokeless tobacco: Not on file  . Alcohol Use: 0.5 oz/week    1 drink(s) per week     1 glass of wine per week  . Drug Use: No  . Sexually Active: Not on file   Other Topics Concern  . Not on file   Social History Narrative   Married1 daughterGets regular exercise: Walking, yoga, weight lifting    Family History  Problem Relation Age of Onset  . Hypertension Mother   . Migraines Mother   . Hypertension Father   . Lung cancer Father     Review of Systems:  As stated in the HPI and otherwise negative.   BP 130/76  Pulse 76  Ht 5\' 9"  (1.753 m)  Wt 159 lb (72.122 kg)  BMI 23.48 kg/m2  Physical Examination: General: Well developed, well nourished, NAD HEENT: OP clear, mucus membranes moist SKIN: warm, dry. No rashes. Neuro: No focal deficits Musculoskeletal: Muscle strength 5/5 all ext Psychiatric: Mood and affect normal Neck: No JVD, no carotid bruits, no thyromegaly, no lymphadenopathy. Lungs:Clear  bilaterally, no wheezes, rhonci, crackles Cardiovascular: Regular rate and rhythm. Slight diastolic murmur. No  gallops or rubs. Abdomen:Soft. Bowel sounds present. Non-tender.  Extremities: No lower extremity edema. Pulses are 2 + in the bilateral DP/PT.  EKG: NSR, rate 71 bpm. LVH. Poor R wave progression.

## 2012-06-05 ENCOUNTER — Other Ambulatory Visit (HOSPITAL_COMMUNITY): Payer: BC Managed Care – PPO

## 2012-06-26 ENCOUNTER — Other Ambulatory Visit (HOSPITAL_COMMUNITY): Payer: BC Managed Care – PPO

## 2012-07-11 ENCOUNTER — Ambulatory Visit (HOSPITAL_COMMUNITY): Payer: BC Managed Care – PPO | Attending: Cardiology | Admitting: Radiology

## 2012-07-11 DIAGNOSIS — I359 Nonrheumatic aortic valve disorder, unspecified: Secondary | ICD-10-CM | POA: Insufficient documentation

## 2012-07-11 DIAGNOSIS — I351 Nonrheumatic aortic (valve) insufficiency: Secondary | ICD-10-CM

## 2012-07-11 NOTE — Progress Notes (Signed)
Echocardiogram performed.  

## 2012-07-18 ENCOUNTER — Telehealth: Payer: Self-pay | Admitting: Cardiovascular Disease

## 2012-07-18 NOTE — Telephone Encounter (Signed)
Left message for call back for echo results.

## 2012-07-18 NOTE — Telephone Encounter (Signed)
Pt had echo and she would like results

## 2012-07-22 NOTE — Telephone Encounter (Signed)
Left message to call back  

## 2012-07-22 NOTE — Telephone Encounter (Signed)
Pt rtn call to pat °

## 2012-07-23 NOTE — Telephone Encounter (Signed)
N/A.  LMTC. 

## 2012-07-23 NOTE — Telephone Encounter (Signed)
Follow up call   Returning call back to nurse  

## 2012-07-24 NOTE — Telephone Encounter (Signed)
Left message on pt's identified voice mail that I was calling to review echo and that I would mail copy to her. Left message to call back if any questions.

## 2012-07-24 NOTE — Telephone Encounter (Signed)
Will forward back to Charlotte Crumb to follow up on.

## 2012-11-20 ENCOUNTER — Emergency Department (HOSPITAL_COMMUNITY)
Admission: EM | Admit: 2012-11-20 | Discharge: 2012-11-20 | Disposition: A | Discharge status: Home / Self Care | Payer: BC Managed Care – PPO | Attending: Emergency Medicine | Admitting: Emergency Medicine

## 2012-11-20 ENCOUNTER — Emergency Department (HOSPITAL_COMMUNITY): Payer: BC Managed Care – PPO

## 2012-11-20 ENCOUNTER — Encounter (HOSPITAL_COMMUNITY): Payer: Self-pay | Admitting: *Deleted

## 2012-11-20 DIAGNOSIS — R079 Chest pain, unspecified: Secondary | ICD-10-CM | POA: Insufficient documentation

## 2012-11-20 DIAGNOSIS — Z79899 Other long term (current) drug therapy: Secondary | ICD-10-CM | POA: Insufficient documentation

## 2012-11-20 DIAGNOSIS — I10 Essential (primary) hypertension: Secondary | ICD-10-CM | POA: Insufficient documentation

## 2012-11-20 DIAGNOSIS — Z880 Allergy status to penicillin: Secondary | ICD-10-CM | POA: Insufficient documentation

## 2012-11-20 DIAGNOSIS — Z8719 Personal history of other diseases of the digestive system: Secondary | ICD-10-CM | POA: Insufficient documentation

## 2012-11-20 DIAGNOSIS — E785 Hyperlipidemia, unspecified: Secondary | ICD-10-CM | POA: Insufficient documentation

## 2012-11-20 DIAGNOSIS — Z8669 Personal history of other diseases of the nervous system and sense organs: Secondary | ICD-10-CM | POA: Insufficient documentation

## 2012-11-20 DIAGNOSIS — Z7982 Long term (current) use of aspirin: Secondary | ICD-10-CM | POA: Insufficient documentation

## 2012-11-20 DIAGNOSIS — I8390 Asymptomatic varicose veins of unspecified lower extremity: Secondary | ICD-10-CM | POA: Insufficient documentation

## 2012-11-20 DIAGNOSIS — Z85820 Personal history of malignant melanoma of skin: Secondary | ICD-10-CM | POA: Insufficient documentation

## 2012-11-20 DIAGNOSIS — M899 Disorder of bone, unspecified: Secondary | ICD-10-CM | POA: Insufficient documentation

## 2012-11-20 DIAGNOSIS — F341 Dysthymic disorder: Secondary | ICD-10-CM | POA: Insufficient documentation

## 2012-11-20 DIAGNOSIS — I359 Nonrheumatic aortic valve disorder, unspecified: Secondary | ICD-10-CM | POA: Insufficient documentation

## 2012-11-20 HISTORY — DX: Nonrheumatic aortic (valve) insufficiency: I35.1

## 2012-11-20 HISTORY — DX: Abnormal result of other cardiovascular function study: R94.39

## 2012-11-20 LAB — BASIC METABOLIC PANEL
CO2: 29 mEq/L (ref 19–32)
Chloride: 103 mEq/L (ref 96–112)
Creatinine, Ser: 0.57 mg/dL (ref 0.50–1.10)
GFR calc Af Amer: >90 mL/min (ref >90)
Potassium: 3.4 mEq/L — ABNORMAL LOW (ref 3.5–5.1)

## 2012-11-20 LAB — CBC WITH DIFFERENTIAL/PLATELET
Basophils Absolute: 0 10*3/uL (ref 0.0–0.1)
Basophils Relative: 0 % (ref 0–1)
HCT: 40.2 % (ref 36.0–46.0)
Hemoglobin: 13.6 g/dL (ref 12.0–15.0)
Lymphocytes Relative: 20 % (ref 12–46)
MCHC: 33.8 g/dL (ref 30.0–36.0)
Monocytes Absolute: 0.3 10*3/uL (ref 0.1–1.0)
Monocytes Relative: 5 % (ref 3–12)
Neutro Abs: 3.7 10*3/uL (ref 1.7–7.7)
Neutrophils Relative %: 74 % (ref 43–77)
RDW: 13.1 % (ref 11.5–15.5)
WBC: 5 10*3/uL (ref 4.0–10.5)

## 2012-11-20 LAB — POCT I-STAT TROPONIN I: Troponin i, poc: 0 ng/mL (ref 0.00–0.08)

## 2012-11-20 MED ORDER — KETOROLAC TROMETHAMINE 30 MG/ML IJ SOLN
30.0000 mg | Freq: Once | INTRAMUSCULAR | Status: DC
Start: 2012-11-20 — End: 2012-11-20

## 2012-11-20 MED ORDER — ACETAMINOPHEN 325 MG PO TABS
650.0000 mg | ORAL_TABLET | Freq: Once | ORAL | Status: AC
  Administered 2012-11-20: 650 mg via ORAL
  Filled 2012-11-20: qty 2

## 2012-11-20 MED ORDER — NAPROXEN 500 MG PO TABS: 500.0000 mg | ORAL_TABLET | Freq: Two times a day (BID) | ORAL | Status: DC

## 2012-11-20 MED ORDER — CYCLOBENZAPRINE HCL 10 MG PO TABS: 10.0000 mg | ORAL_TABLET | Freq: Two times a day (BID) | ORAL | Status: DC | PRN

## 2012-11-20 NOTE — ED Notes (Signed)
Received from pod A--- c/o headache from NTG that feels like migraine-- does take migraine medicine, denies any chest pain at present.

## 2012-11-20 NOTE — ED Provider Notes (Signed)
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  Left ventricular hypertrophy  ST/T Wave abnormalities: normal  Conduction Disutrbances:none  Narrative Interpretation: Abnormal EKG  Old EKG Reviewed: none available    Carleene Cooper III, MD 11/20/12 [phone]

## 2012-11-20 NOTE — ED Notes (Signed)
Lights dimmed for pt's comfort.  Crackers and soda provided to pt.

## 2012-11-20 NOTE — ED Notes (Signed)
Denies cp on discharge

## 2012-11-20 NOTE — ED Provider Notes (Signed)
History    CSN: [id number] Arrival date & time 11/20/12  0554  First MD Initiated Contact with Patient 11/20/12 [phone]     Chief Complaint  Patient presents with  . Chest Pain   (Consider location/radiation/quality/duration/timing/severity/associated sxs/prior Treatment) HPI Comments: Patient is a 62 year old female with a past medical history of mild aortic insufficiency, hypertension, hyperlipidemia who presents with chest past that woke the patient up around 5am this morning. The pain is sharp and severe and located in her left chest under her left breast without radiation. The pain has been constant since the onset, for about 1.5 hours, and is gradually improving. Patient reports movement and deep inspiration makes the pain worse. Nothing makes the pain better. Patient has not tried anything for pain relief. Patient denies any associated symptoms with the pain. Patient reports working out with a trainer for the past year and reports increased intensity in her workout yesterday evening. Patient was given 3 SL nitroglycerin and 324mg  ASA en route to the ED.   Past Medical History  Diagnosis Date  . Migraines   . Anxiety and depression   . Adenomatous colon polyp   . Osteopenia   . History of melanoma     Followed with Dr. Danella Deis  . Valvular heart disease     Mild to moderate aortic insufficiency with possible bicuspid aortic valve  . Varicose veins   . Leukocytosis     Mild  . HTN (hypertension)   . Hyperlipidemia   . Aortic valve insufficiency     Mild   Past Surgical History  Procedure Laterality Date  . Dilation and curettage of uterus    . High endocervical polyp removed    . Cholecystectomy    . Femoral hernia repair    . Ovarian cyst removal    . Btl    . Melanoma excision     Family History  Problem Relation Age of Onset  . Hypertension Mother   . Migraines Mother   . Hypertension Father   . Lung cancer Father    History  Substance Use Topics  . Smoking  status: Never Smoker   . Smokeless tobacco: Not on file  . Alcohol Use: 0.5 oz/week    1 drink(s) per week     Comment: 1 glass of wine per week   OB History   Grav Para Term Preterm Abortions TAB SAB Ect Mult Living                 Review of Systems  Cardiovascular: Positive for chest pain.  All other systems reviewed and are negative.    Allergies  Penicillins  Home Medications   Current Outpatient Rx  Name  Route  Sig  Dispense  Refill  . acetaminophen (TYLENOL) 500 MG tablet   Oral   Take 500 mg by mouth as needed.           Marland Kitchen aspirin 81 MG EC tablet   Oral   Take 81 mg by mouth daily.           . Calcium Carbonate-Vitamin D (CALCIUM-VITAMIN D3) 600-200 MG-UNIT TABS   Oral   Take 3 tablets by mouth daily.           . Cholecalciferol (VITAMIN D) 1000 UNITS capsule   Oral   Take 1,000 Units by mouth daily.          Marland Kitchen ESTRACE VAGINAL 0.1 MG/GM vaginal cream      as  needed.         . fish oil-omega-3 fatty acids 1000 MG capsule   Oral   Take 1 g by mouth daily.           Marland Kitchen ibuprofen (ADVIL,MOTRIN) 200 MG tablet   Oral   Take 200 mg by mouth as needed.           Marland Kitchen LIPITOR 10 MG tablet      TAKE ONE HALF TABLET DAILY         . multivitamin (THERAGRAN) per tablet   Oral   Take 1 tablet by mouth daily.           Marland Kitchen triamterene-hydrochlorothiazide (DYAZIDE) 37.5-25 MG per capsule      1 tab daily         . ZOLMitriptan (ZOMIG) 2.5 MG tablet   Oral   Take 2.5 mg by mouth as needed.            BP 130/67  Pulse 6  Temp(Src) 97.5 F (36.4 C) (Oral)  SpO2 100% Physical Exam  Nursing note and vitals reviewed. Constitutional: She is oriented to person, place, and time. She appears well-developed and well-nourished. No distress.  HENT:  Head: Normocephalic and atraumatic.  Eyes: Conjunctivae and EOM are normal. Pupils are equal, round, and reactive to light. No scleral icterus.  Neck: Normal range of motion.  Cardiovascular:  Normal rate and regular rhythm.  Exam reveals no gallop and no friction rub.   No murmur heard. Pulmonary/Chest: Effort normal and breath sounds normal. She has no wheezes. She has no rales. She exhibits no tenderness.  Tenderness to palpation of left chest under left breast and midaxillary line. No obvious deformity.   Abdominal: Soft. She exhibits no distension. There is no tenderness. There is no rebound and no guarding.  Musculoskeletal: Normal range of motion.  Neurological: She is alert and oriented to person, place, and time. Coordination normal.  Speech is goal-oriented. Moves limbs without ataxia.   Skin: Skin is warm and dry.  Psychiatric: She has a normal mood and affect. Her behavior is normal.    ED Course  Procedures (including critical care time) Labs Reviewed  BASIC METABOLIC PANEL - Abnormal; Notable for the following:    Potassium 3.4 (*)    Glucose, Bld 101 (*)    All other components within normal limits  CBC WITH DIFFERENTIAL  POCT I-STAT TROPONIN I  POCT I-STAT TROPONIN I   Dg Chest 2 View  11/20/2012   *RADIOLOGY REPORT*  Clinical Data: Left-sided chest pain.  Right shoulder pain.  CHEST - 2 VIEW  Comparison: None.  Findings: No significant osseous abnormality. No consolidation or edema.  Small wispy density overlapping the upper thoracic spine on the lateral projection is likely summation of shadows when correlated with the frontal view. No effusion or pneumothorax. Cardiomediastinal size and contour are within normal limits. Surgical clips in the right upper quadrant.  IMPRESSION: No evidence of acute cardiopulmonary disease.   Original Report Authenticated By: Tiburcio Pea   1. Chest pain     MDM  6:35 AM Labs and chest xray pending. Patient refuses pain medication at this time. Doubt PE at this time. No recent surgery, previous DVT/PE, recent travel, exogenous estrogen.    9:30 AM Labs unremarkable. First troponin negative. Chest xray shows no acute  changes. Patient will have a second troponin and I will consult [facility]. Patient declines pain medication.   2:03 PM Dr. Daleen Squibb saw the  patient and says she can be discharged and return with worsening or concerning symptoms.   Emilia Beck, PA-C 11/20/12 1407

## 2012-11-20 NOTE — Consult Note (Signed)
Patient ID: Kristine Jones MRN: [id number], DOB/AGE: 1951/02/21   Admit date: 11/20/2012   Primary Physician: Londell Moh, MD Primary Cardiologist: Doylene Bode MD  Pt. Profile:  62 year old married white female with chest pain.  Problem List  Past Medical History  Diagnosis Date  . Migraines   . Anxiety and depression   . Adenomatous colon polyp   . Osteopenia   . History of melanoma     Followed with Dr. Danella Deis  . Aortic insufficiency     a. 06/2012 Echo: EF 55-60%, Gr 1 DD, Mild AI, Triv MR, mildly dil LA, nl RV fxn.  . Varicose veins   . Leukocytosis     Mild  . HTN (hypertension)   . Hyperlipidemia   . Abnormal stress test     a. 09/2008, ST depression on ETT @ [facility], no further w/u.    Past Surgical History  Procedure Laterality Date  . Dilation and curettage of uterus    . High endocervical polyp removed    . Cholecystectomy    . Femoral hernia repair    . Ovarian cyst removal    . Btl    . Melanoma excision       Allergies  Allergies  Allergen Reactions  . Penicillins Other (See Comments)    "flu like symptoms"    HPI  Awoke 4:30 this morning with a sharp well localized discomfort under her left breast. She woke her husband up after about 30 minutes. When she stood up she felt lightheaded and felt like she might pass out. 911 was called. Heart rate was apparently in the 40s. Sublingual nitroglycerin given with no effect. She denies a headache. The discomfort did not radiate. There was no nausea vomiting or diaphoresis. There was no shortness of breath.  She worked out with a Systems analyst yesterday without any symptoms of angina or ischemia. She's never had any symptoms of ischemia.  His history of mild to moderate aortic insufficiency and has an appointment in our office in August. There's no history of coronary disease. There is a history of vascular disease. She is very few cardiac risk factors.  EKG is normal.  Troponins are negative x2. Chest x-ray shows no acute cardiopulmonary disease.  She denies any occult swelling does have a history of reflux. She ate Canteloupe and some coconut milk before going to bed. She is not feel as this is her reflux. She denies melena or hematemesis.  Home Medications  Prior to Admission medications   Medication Sig Start Date End Date Taking? Authorizing Provider  acetaminophen (TYLENOL) 500 MG tablet Take 500 mg by mouth every 6 (six) hours as needed for pain.    Yes Historical Provider, MD  aspirin 81 MG EC tablet Take 81 mg by mouth daily.     Yes Historical Provider, MD  Calcium Carbonate-Vitamin D (CALCIUM-VITAMIN D3) 600-200 MG-UNIT TABS Take 3 tablets by mouth daily.     Yes Historical Provider, MD  Cholecalciferol (VITAMIN D) 1000 UNITS capsule Take 1,000 Units by mouth daily.    Yes Historical Provider, MD  fish oil-omega-3 fatty acids 1000 MG capsule Take 1 g by mouth daily.     Yes Historical Provider, MD  ibuprofen (ADVIL,MOTRIN) 200 MG tablet Take 200 mg by mouth every 8 (eight) hours as needed for pain.    Yes Historical Provider, MD  LIPITOR 10 MG tablet Take 5 mg by mouth daily. TAKE ONE HALF TABLET DAILY 08/24/10  Yes Historical  Provider, MD  multivitamin [facility]) per tablet Take 1 tablet by mouth daily.     Yes Historical Provider, MD  triamterene-hydrochlorothiazide (DYAZIDE) 37.5-25 MG per capsule Take 1 capsule by mouth daily. 1 tab daily 12/13/11  Yes Historical Provider, MD  ZOLMitriptan (ZOMIG) 2.5 MG tablet Take 2.5 mg by mouth as needed for migraine.    Yes Historical Provider, MD    Family History  Family History  Problem Relation Age of Onset  . Hypertension Mother   . Migraines Mother   . Hypertension Father   . Lung cancer Father     Social History  History   Social History  . Marital Status: Married    Spouse Name: N/A    Number of Children: N/A  . Years of Education: N/A   Occupational History  . Cytogeneticist   Social History Main Topics  . Smoking status: Never Smoker   . Smokeless tobacco: Not on file  . Alcohol Use: 0.5 oz/week    1 drink(s) per week     Comment: 1 glass of wine per week  . Drug Use: No  . Sexually Active: Not on file   Other Topics Concern  . Not on file   Social History Narrative   Married   1 daughter   Gets regular exercise: Walking, yoga, weight lifting     Review of Systems General:  No chills, fever, night sweats or weight changes.  Cardiovascular:  No dyspnea on exertion, edema, orthopnea, palpitations, paroxysmal nocturnal dyspnea. Dermatological: No rash, lesions/masses Respiratory: No cough, dyspnea Urologic: No hematuria, dysuria Abdominal:   No nausea, vomiting, diarrhea, bright red blood per rectum, melena, or hematemesis Neurologic:  No visual changes, wkns, changes in mental status. All other systems reviewed and are otherwise negative except as noted above.  Physical Exam  Blood pressure 143/78, pulse 63, temperature 97.5 F (36.4 C), temperature source Oral, resp. rate 16, SpO2 100.00%.  General: Pleasant, NAD Psych: Normal affect. Neuro: Alert and oriented X 3. Moves all extremities spontaneously. HEENT: Normal  Neck: Supple without bruits or JVD. Lungs:  Resp regular and unlabored, CTA. Heart: RRR no rub, normal S1-S2, 2/6 systolic murmur left upper sternal border, very soft aortic insufficiency murmur. Abdomen: Soft, non-tender, non-distended, BS + x 4.  Extremities: No clubbing, cyanosis or edema. DP/PT/Radials 2+ and equal bilaterally.  Labs  No results found for this basename: CKTOTAL, CKMB, TROPONINI,  in the last 72 hours Lab Results  Component Value Date   WBC 5.0 11/20/2012   HGB 13.6 11/20/2012   HCT 40.2 11/20/2012   MCV 85.5 11/20/2012   PLT 218 11/20/2012     Recent Labs Lab 11/20/12 0735  NA 139  K 3.4*  CL 103  CO2 29  BUN 15  CREATININE 0.57  CALCIUM 10.1  GLUCOSE 101*   Radiology/Studies  Dg  Chest 2 View  11/20/2012   *RADIOLOGY REPORT*  Clinical Data: Left-sided chest pain.  Right shoulder pain.  CHEST - 2 VIEW  Comparison: None.  Findings: No significant osseous abnormality. No consolidation or edema.  Small wispy density overlapping the upper thoracic spine on the lateral projection is likely summation of shadows when correlated with the frontal view. No effusion or pneumothorax. Cardiomediastinal size and contour are within normal limits. Surgical clips in the right upper quadrant.  IMPRESSION: No evidence of acute cardiopulmonary disease.   Original Report Authenticated By: Tiburcio Pea   ECG  Normal sinus rhythm,  LVH with repolarization changes. No change from previous ECG.  ASSESSMENT AND PLAN  Sudden onset of well localized chest discomfort. Most likely muscular spasm that is now resolved. It is clearly not cardiac or coronary. He is also not muscle strain,  trauma or costochondritis without tenderness on exam.  Patient reassured. Will discharge home. She will keep her appointment in August., Tylenol given for HA.   Signed, Nicolasa Ducking, NP 11/20/2012, 12:50 PM

## 2012-11-20 NOTE — ED Notes (Signed)
Cardmaster Lillia Abed) returned call-- Ward Givens, PA notified-- [phone]

## 2012-11-20 NOTE — ED Provider Notes (Signed)
Medical screening examination/treatment/procedure(s) were performed by non-physician practitioner and as supervising physician I was immediately available for consultation/collaboration.  Joyceline Maiorino M Bryn Perkin, MD 11/20/12 1812 

## 2012-11-20 NOTE — ED Notes (Signed)
Patient presents to ed via [facility] states she was awaken with left anterior and lateral chest pain worse with movement and deep inspiration.  Was given 3 NTg and 324mg  of ASA with slight relief. States she worked out with a Psychologist, educational today and the workout was more than she usually does.

## 2012-11-20 NOTE — Progress Notes (Signed)
9:22 AM 62 yo lady had episode of chest pain around 4:30 A.M. Felt in left anterior chest.  She has a history of hypertension and aortic insufficiency, followed by Dr. Clifton James of [facility].  Exam shows her to be in no distress other than a headache from nitroglycerin.  Heart sounds normal, I could not appreciate a murmur.  Lungs clear.  No chest wall tenderness.  She did have a strenuous workout yesterday with the personal trainer. Lab tests have been negative to this point.  Chest x-ray negative.  EKG shows LVH, poor R wave progression in precordial leads but no acute change.  Plan will be to get a 3 hour troponin, and then to seek consultation with [facility].

## 2012-11-20 NOTE — ED Notes (Signed)
Ward Givens returned call -- lunch ordered for patient.

## 2012-11-20 NOTE — ED Notes (Signed)
Pt ambulated to the restroom with out difficulty.

## 2012-11-20 NOTE — ED Notes (Signed)
Madalyn Rob paged--- [phone].

## 2012-12-06 ENCOUNTER — Ambulatory Visit (INDEPENDENT_AMBULATORY_CARE_PROVIDER_SITE_OTHER): Payer: BC Managed Care – PPO | Admitting: Physician Assistant

## 2012-12-06 ENCOUNTER — Encounter: Payer: Self-pay | Admitting: Physician Assistant

## 2012-12-06 VITALS — BP 140/90 | HR 66 | Ht 69.0 in | Wt 161.0 lb

## 2012-12-06 DIAGNOSIS — R079 Chest pain, unspecified: Secondary | ICD-10-CM

## 2012-12-06 DIAGNOSIS — I10 Essential (primary) hypertension: Secondary | ICD-10-CM

## 2012-12-06 DIAGNOSIS — E785 Hyperlipidemia, unspecified: Secondary | ICD-10-CM

## 2012-12-06 DIAGNOSIS — R55 Syncope and collapse: Secondary | ICD-10-CM

## 2012-12-06 DIAGNOSIS — I359 Nonrheumatic aortic valve disorder, unspecified: Secondary | ICD-10-CM

## 2012-12-06 NOTE — Patient Instructions (Addendum)
NO MEDICATION CHANGES WERE MADE TODAY  Your physician has requested that you have a stress echocardiogram DX 786.50, SYNCOPE, 424.1. For further information please visit [email or web address] Please follow instruction sheet as given.  PLEASE FOLLOW UP WITH DR. Clifton James IN ABOUT 6-8 WEEKS

## 2012-12-06 NOTE — Progress Notes (Signed)
[address] [place], [place]  [address] Phone: [phone] Fax:  [phone]  Date:  12/06/2012   ID:  Kristine Jones, Kristine Jones [birth year], MRN [id number]  PCP:  Londell Moh, MD  Cardiologist:  Dr. Verne Carrow     History of Present Illness: Kristine Jones is a 62 y.o. female who returns for follow up.  She has a history of aortic insufficiency with possible bicuspid aortic valve, HTN, HL. Patient was previously followed by Dr. Aleen Campi. She then established with Dr. Verne Carrow in 2011.  She had undergone treadmill stress testing with Dr. Aleen Campi in 09/2008.  There were some abnormalities on her ECGs but no further testing was pursued.  Echo in 12/2010 with mild to moderate AI and mild MR.  Last seen by Dr. Clifton James 12/2011.  Echo 06/2012: EF 55-60%, normal wall motion, grade 1 diastolic dysfunction, AV trileaflet with mild AI, trivial MR, mild LAE, PASP 25.   She was then seen in the emergency room 11/20/12 by Dr. Daleen Squibb with chest pain. CEs were negative.  Chest x-ray was non-acute. Chest pain was felt to be atypical and no further cardiac workup was pursued.  She was then seen by her PCP for syncope. Triamterene/HCTZ was discontinued. F/u was arranged here.   Patient tells me that her syncope was associated with her chest pain. She had been on a different type of workout with her trainer the day prior to this occurring. Chest pain woke her up in the middle of the night. With the pain, she felt lightheaded and proceeded to pass out. EMS was called. She had another episode while they were there. She tells me that she was told that her heart rate was 44. She denies associated dyspnea with her chest pain.  It was left sided and sharp and seemed to pulsate. She denies any orthopnea, PND or edema. She denies exertional chest discomfort. She denies aggravating or alleviating factors.  Labs (7/14):  K 3.4, Cr 0.57, Hgb 13.6  Wt Readings from Last 3  Encounters:  12/06/12 161 lb (73.029 kg)  12/29/11 159 lb (72.122 kg)  11/24/10 157 lb (71.215 kg)     Past Medical History  Diagnosis Date  . Migraines   . Anxiety and depression   . Adenomatous colon polyp   . Osteopenia   . History of melanoma     Followed with Dr. Danella Deis  . Aortic insufficiency     a. 06/2012 Echo: EF 55-60%, Gr 1 DD, Mild AI, Triv MR, mildly dil LA, nl RV fxn.  . Varicose veins   . Leukocytosis     Mild  . HTN (hypertension)   . Hyperlipidemia   . Abnormal stress test     a. 09/2008, ST depression on ETT @ [facility], no further w/u.    Current Outpatient Prescriptions  Medication Sig Dispense Refill  . acetaminophen (TYLENOL) 500 MG tablet Take 500 mg by mouth every 6 (six) hours as needed for pain.       Marland Kitchen aspirin 81 MG EC tablet Take 81 mg by mouth daily.        . Calcium Carbonate-Vitamin D (CALCIUM-VITAMIN D3) 600-200 MG-UNIT TABS Take 3 tablets by mouth daily.        . Cholecalciferol (VITAMIN D) 1000 UNITS capsule Take 1,000 Units by mouth daily.       . fish oil-omega-3 fatty acids 1000 MG capsule Take 1 g by mouth daily.        Marland Kitchen  LIPITOR 10 MG tablet Take 5 mg by mouth daily. TAKE ONE HALF TABLET DAILY      . multivitamin (THERAGRAN) per tablet Take 1 tablet by mouth daily.        Marland Kitchen ZOLMitriptan (ZOMIG) 2.5 MG tablet Take 2.5 mg by mouth as needed for migraine.        No current facility-administered medications for this visit.    Allergies:    Allergies  Allergen Reactions  . Penicillins Other (See Comments)    "flu like symptoms"    Social History:  The patient  reports that she has never smoked. She does not have any smokeless tobacco history on file. She reports that she drinks about 0.5 ounces of alcohol per week. She reports that she does not use illicit drugs.   ROS:  Please see the history of present illness.      All other systems reviewed and negative.   PHYSICAL EXAM: VS:  BP 140/90  Pulse 66  Ht 5\' 9"  (1.753 m)  Wt 161  lb (73.029 kg)  BMI 23.76 kg/m2 Well nourished, well developed, in no acute distress HEENT: normal Neck: no JVD Cardiac:  normal S1, S2; RRR; no murmur Lungs:  clear to auscultation bilaterally, no wheezing, rhonchi or rales Abd: soft, nontender, no hepatomegaly Ext: no edema Skin: warm and dry Neuro:  CNs 2-12 intact, no focal abnormalities noted  EKG:  NSR, HR 66, nonspecific ST-T wave changes     ASSESSMENT AND PLAN:  1. Chest Pain: Very atypical. However, given syncope associated with her chest pain, I think it is important to rule out ischemia. Suspicion is somewhat low.  Could proceed with a plain ETT to rule out ischemia.  But, her ECG has been noted to be abnormal in the past with ETT. Therefore, I have recommended that we proceed with ETT-echocardiogram. If this is normal, no further ischemic evaluation necessary. 2. Syncope:  Syncope is likely vasovagal by description. Proceed with ETT-echocardiogram as noted. As long as this is normal, no further workup. If she has recurrent syncope in the future, consider an event monitor. 3. Hypertension: Blood pressure fairly well controlled without triamterene/HCTZ. Continue to monitor. 4. Hyperlipidemia: Continue statin. 5. Aortic Insufficiency:  Mild by recent echo.   6. Disposition: Follow up with Dr. Verne Carrow in 6-8 weeks.   Signed, Tereso Newcomer, PA-C  12/06/2012 11:53 AM

## 2012-12-20 ENCOUNTER — Ambulatory Visit (HOSPITAL_COMMUNITY): Payer: BC Managed Care – PPO | Attending: Physician Assistant | Admitting: Radiology

## 2012-12-20 ENCOUNTER — Encounter: Payer: Self-pay | Admitting: Cardiovascular Disease

## 2012-12-20 ENCOUNTER — Ambulatory Visit (HOSPITAL_BASED_OUTPATIENT_CLINIC_OR_DEPARTMENT_OTHER): Payer: BC Managed Care – PPO

## 2012-12-20 DIAGNOSIS — R072 Precordial pain: Secondary | ICD-10-CM

## 2012-12-20 DIAGNOSIS — I359 Nonrheumatic aortic valve disorder, unspecified: Secondary | ICD-10-CM

## 2012-12-20 DIAGNOSIS — R55 Syncope and collapse: Secondary | ICD-10-CM | POA: Insufficient documentation

## 2012-12-20 DIAGNOSIS — R079 Chest pain, unspecified: Secondary | ICD-10-CM | POA: Insufficient documentation

## 2012-12-20 DIAGNOSIS — E785 Hyperlipidemia, unspecified: Secondary | ICD-10-CM | POA: Insufficient documentation

## 2012-12-20 DIAGNOSIS — R0989 Other specified symptoms and signs involving the circulatory and respiratory systems: Secondary | ICD-10-CM

## 2012-12-20 DIAGNOSIS — I10 Essential (primary) hypertension: Secondary | ICD-10-CM | POA: Insufficient documentation

## 2012-12-20 NOTE — Progress Notes (Signed)
Stress Echocardiogram performed.  

## 2012-12-24 ENCOUNTER — Telehealth: Payer: Self-pay | Admitting: Cardiovascular Disease

## 2012-12-24 NOTE — Telephone Encounter (Signed)
New problem   Pt aware pat is not here today-pt calling about a sooner appt w/dr Clifton James

## 2012-12-24 NOTE — Telephone Encounter (Signed)
Returned call to patient she stated she wanted appointment this week with Dr.McAlhany to go over recent test results.Stated she was very anxious.Message sent to Dr.McAlhany's nurse for a sooner appointment.

## 2012-12-25 NOTE — Telephone Encounter (Signed)
Spoke with Kristine Jones and appt made for her to see Dr. Clifton James at 8:30 on August 14,2014 at 8:30. She is not having any problems but is extremely anxious about abnormal stress echo.

## 2012-12-25 NOTE — Telephone Encounter (Signed)
Agree. cdm 

## 2012-12-26 ENCOUNTER — Encounter: Payer: Self-pay | Admitting: Cardiovascular Disease

## 2012-12-26 ENCOUNTER — Ambulatory Visit (INDEPENDENT_AMBULATORY_CARE_PROVIDER_SITE_OTHER): Payer: BC Managed Care – PPO | Admitting: Cardiovascular Disease

## 2012-12-26 VITALS — BP 174/104 | HR 90 | Ht 69.0 in | Wt 159.0 lb

## 2012-12-26 DIAGNOSIS — R9439 Abnormal result of other cardiovascular function study: Secondary | ICD-10-CM

## 2012-12-26 DIAGNOSIS — I10 Essential (primary) hypertension: Secondary | ICD-10-CM

## 2012-12-26 DIAGNOSIS — R079 Chest pain, unspecified: Secondary | ICD-10-CM

## 2012-12-26 MED ORDER — HYDROCHLOROTHIAZIDE 25 MG PO TABS: 25.0000 mg | ORAL_TABLET | Freq: Every day | ORAL | Status: DC

## 2012-12-26 NOTE — Patient Instructions (Addendum)
Your physician has requested that you have a cardiac catheterization. Cardiac catheterization is used to diagnose and/or treat various heart conditions. Doctors may recommend this procedure for a number of different reasons. The most common reason is to evaluate chest pain. Chest pain can be a symptom of coronary artery disease (CAD), and cardiac catheterization can show whether plaque is narrowing or blocking your heart's arteries. This procedure is also used to evaluate the valves, as well as measure the blood flow and oxygen levels in different parts of your heart. For further information please visit [email or web address] Please follow instruction sheet, as given.  Your physician recommends that you return for lab work: BMP, CBC and PT/INR  Your physician has recommended you make the following change in your medication: START HCTZ 25mg  take one by mouth daily    Coronary Angiography Coronary angiography is an X-ray procedure used to look at the arteries in the heart. In this procedure, a dye is injected through a long, hollow tube (catheter). The catheter is about the size of a piece of cooked spaghetti. The catheter injects a dye into an artery in your groin. X-rays are then taken to show if there is a blockage in the arteries of your heart. BEFORE THE PROCEDURE   Let your caregiver know if you have allergies to shellfish or contrast dye. Also let your caregiver know if you have kidney problems or failure.  Do not eat or drink starting from midnight up to the time of the procedure, or as directed.  You may drink enough water to take your medications the morning of the procedure if you were instructed to do so.  You should be at the hospital or outpatient facility where the procedure is to be done 60 minutes prior to the procedure or as directed. PROCEDURE  You may be given an IV medication to help you relax before the procedure.  You will be prepared for the procedure by washing and  shaving the area where the catheter will be inserted. This is usually done in the groin but may be done in the fold of your arm by your elbow.  A medicine will be given to numb your groin where the catheter will be inserted.  A specially trained doctor will insert the catheter into an artery in your groin. The catheter is guided by using a special type of X-ray (fluoroscopy) to the blood vessel being examined.  A special dye is then injected into the catheter and X-rays are taken. The dye helps to show where any narrowing or blockages are located in the heart arteries. AFTER THE PROCEDURE   After the procedure you will be kept in bed lying flat for several hours. You will be instructed to not bend or cross your legs.  The groin insertion site will be watched and checked frequently.  The pulse in your feet will be checked frequently.  Additional blood tests, X-rays and an EKG may be done.  You may stay in the hospital overnight for observation. SEEK IMMEDIATE MEDICAL CARE IF:   You develop chest pain, shortness of breath, feel faint, or pass out.  There is bleeding, swelling, or drainage from the catheter insertion site.  You develop pain, discoloration, coldness, or severe bruising in the leg or area where the catheter was inserted.  You have a fever. Document Released: 11/05/2002 Document Revised: 07/24/2011 Document Reviewed: 12/25/2007 [facility] Patient Information 2014 [place], [place].

## 2012-12-26 NOTE — Addendum Note (Signed)
Addended by: Verne Carrow D on: 12/26/2012 12:51 PM   Modules accepted: Orders

## 2012-12-26 NOTE — Progress Notes (Signed)
History of Present Illness: 62 y.o. female with history of aortic insufficiency with possible bicuspid aortic valve, HTN, HLD who is here today for cardiac follow up. Patient was previously followed by Dr. Aleen Campi. She established with me in 2011.  She had undergone treadmill stress testing with Dr. Aleen Campi in 09/2008. There were some abnormalities on her ECGs but no further testing was pursued. Echo in 12/2010 with mild to moderate AI and mild MR. Echo 06/2012: EF 55-60%, normal wall motion, grade 1 diastolic dysfunction, AV trileaflet with mild AI, trivial MR, mild LAE, PASP 25. She was then seen in the emergency room 11/20/12 by Dr. Daleen Squibb with chest pain. CEs were negative. Chest x-ray was non-acute. Chest pain was felt to be atypical and no further cardiac workup was pursued. She was then seen by her PCP for syncope. Triamterene/HCTZ was discontinued. She was seen in our office 12/06/12 by Tereso Newcomer, PA-C and noted syncopal events with bradycardia. No exertional discomfort but she did describe resting left sided chest discomfort. Stress echo on 12/20/12 with upsloping ST depression with exercise and apical hypokinesis, worrisome for ischemia.   She is here today to review the results of her stress test. No further chest pain. Very anxious.   Primary Care Physician: Merri Brunette  Past Medical History  Diagnosis Date  . Migraines   . Anxiety and depression   . Adenomatous colon polyp   . Osteopenia   . History of melanoma     Followed with Dr. Danella Deis  . Aortic insufficiency     a. 06/2012 Echo: EF 55-60%, Gr 1 DD, Mild AI, Triv MR, mildly dil LA, nl RV fxn.  . Varicose veins   . Leukocytosis     Mild  . HTN (hypertension)   . Hyperlipidemia   . Abnormal stress test     a. 09/2008, ST depression on ETT @ [facility], no further w/u.    Past Surgical History  Procedure Laterality Date  . Dilation and curettage of uterus    . High endocervical polyp removed    . Cholecystectomy    .  Femoral hernia repair    . Ovarian cyst removal    . Btl    . Melanoma excision      Current Outpatient Prescriptions  Medication Sig Dispense Refill  . acetaminophen (TYLENOL) 500 MG tablet Take 500 mg by mouth every 6 (six) hours as needed for pain.       Marland Kitchen aspirin 81 MG EC tablet Take 81 mg by mouth daily.        . Calcium Carbonate-Vitamin D (CALCIUM-VITAMIN D3) 600-200 MG-UNIT TABS Take 3 tablets by mouth daily.        . Cholecalciferol (VITAMIN D) 1000 UNITS capsule Take 1,000 Units by mouth daily.       . fish oil-omega-3 fatty acids 1000 MG capsule Take 1 g by mouth daily.        Marland Kitchen ibuprofen (ADVIL,MOTRIN) 200 MG tablet TAKE 4 TABLETS DAILY      . LIPITOR 10 MG tablet Take 5 mg by mouth daily. TAKE ONE HALF TABLET DAILY      . multivitamin (THERAGRAN) per tablet Take 1 tablet by mouth daily.        Marland Kitchen ZOLMitriptan (ZOMIG) 2.5 MG tablet Take 2.5 mg by mouth as needed for migraine.        No current facility-administered medications for this visit.    Allergies  Allergen Reactions  . Naproxen  CAUSE GI PROBLEMS,SLEEPING OR HAZZY  . Penicillins Other (See Comments)    "flu like symptoms"    History   Social History  . Marital Status: Married    Spouse Name: N/A    Number of Children: N/A  . 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 Smoking status: Never Smoker   . Smokeless tobacco: Not on file  . Alcohol Use: 0.5 oz/week    1 drink(s) per week     Comment: 1 glass of wine per week  . Drug Use: No  . Sexual Activity: Not on file   Other Topics Concern  . Not on file   Social History Narrative   Married   1 daughter   Gets regular exercise: Walking, yoga, weight lifting    Family History  Problem Relation Age of Onset  . Hypertension Mother   . Migraines Mother   . Hypertension Father   . Lung cancer Father     Review of Systems:  As stated in the HPI and otherwise  negative.   BP 174/104  Pulse 90  Ht 5\' 9"  (1.753 m)  Wt 159 lb (72.122 kg)  BMI 23.47 kg/m2  SpO2 98%  Physical Examination: General: Well developed, well nourished, NAD HEENT: OP clear, mucus membranes moist SKIN: warm, dry. No rashes. Neuro: No focal deficits Musculoskeletal: Muscle strength 5/5 all ext Psychiatric: Mood and affect normal Neck: No JVD, no carotid bruits, no thyromegaly, no lymphadenopathy. Lungs:Clear bilaterally, no wheezes, rhonci, crackles Cardiovascular: Regular rate and rhythm. No murmurs, gallops or rubs. Abdomen:Soft. Bowel sounds present. Non-tender.  Extremities: No lower extremity edema. Pulses are 2 + in the bilateral DP/PT.  Stress echo 12/20/12: - Stress ECG conclusions: The stress ECG was normal. - Staged echo: Echocardiographic findings were positive for stress-induced ischemia. Impressions: - Stress echocardiogram with no chest pain; nondiagnostic, upsloping ST depression; there appears to be hypokinesis of the apex with peak exercise on apical 4 chamber view consistent with ischemia.   Assessment and Plan:   1. Chest pain/Abnormal stress test: Will arrange cardiac cath on in the outpatient cath lab 01/07/13. Risks and benefits reviewed today. Pre-cath labs next week.   2. HTN: Elevated today. She has a medication at home that she has not started per primary care. Will discuss with primary care.

## 2012-12-27 ENCOUNTER — Ambulatory Visit: Payer: BC Managed Care – PPO | Admitting: Cardiovascular Disease

## 2012-12-30 ENCOUNTER — Telehealth: Payer: Self-pay | Admitting: Cardiovascular Disease

## 2012-12-30 NOTE — Telephone Encounter (Signed)
Ok. Thanks, chris

## 2012-12-30 NOTE — Telephone Encounter (Signed)
Spoke with pt. She would like to change cath to this Friday. I spoke with [facility] and cath changed to January 03, 2013 at 8:30. I spoke with pt and gave her this information. She is having lab work drawn on August 20.  She is also complaining of anxiety and is asking if Dr. Clifton James would prescribe something for her or if she should contact primary care. I asked her to contact primary care for this.

## 2012-12-30 NOTE — Telephone Encounter (Signed)
New problem   Pt has more questions regarding the test that dr Clifton James wants her to have

## 2013-01-01 ENCOUNTER — Other Ambulatory Visit (INDEPENDENT_AMBULATORY_CARE_PROVIDER_SITE_OTHER): Payer: BC Managed Care – PPO

## 2013-01-01 DIAGNOSIS — R9439 Abnormal result of other cardiovascular function study: Secondary | ICD-10-CM

## 2013-01-01 DIAGNOSIS — R079 Chest pain, unspecified: Secondary | ICD-10-CM

## 2013-01-01 DIAGNOSIS — I10 Essential (primary) hypertension: Secondary | ICD-10-CM

## 2013-01-01 LAB — CBC WITH DIFFERENTIAL/PLATELET
Basophils Absolute: 0 10*3/uL (ref 0.0–0.1)
Eosinophils Absolute: 0 10*3/uL (ref 0.0–0.7)
Hemoglobin: 14.1 g/dL (ref 12.0–15.0)
Lymphocytes Relative: 33 % (ref 12.0–46.0)
MCHC: 33.9 g/dL (ref 30.0–36.0)
MCV: 85 fl (ref 78.0–100.0)
Monocytes Absolute: 0.4 10*3/uL (ref 0.1–1.0)
Neutro Abs: 2.2 10*3/uL (ref 1.4–7.7)
Neutrophils Relative %: 54.5 % (ref 43.0–77.0)
RDW: 13.9 % (ref 11.5–14.6)

## 2013-01-01 LAB — BASIC METABOLIC PANEL
CO2: 32 mEq/L (ref 19–32)
Calcium: 9.9 mg/dL (ref 8.4–10.5)
Creatinine, Ser: 0.7 mg/dL (ref 0.4–1.2)
Glucose, Bld: 87 mg/dL (ref 70–99)

## 2013-01-02 ENCOUNTER — Telehealth: Payer: Self-pay | Admitting: Cardiovascular Disease

## 2013-01-02 MED ORDER — POTASSIUM CHLORIDE CRYS ER 20 MEQ PO TBCR: EXTENDED_RELEASE_TABLET | ORAL | Status: DC

## 2013-01-02 NOTE — Telephone Encounter (Signed)
I spoke with the pt and she said her PCP has prescribed Trazodone at bedtime and Alprazolam as needed.  The pt would like to know if she can take this prior to her cardiac cath. I made the pt aware that these medications are okay. I also spoke with the pt and made her aware that Dr Clifton James would like her take potassium chloride today and tomorrow due to pre-cath labs. I will forward this message to Dr Clifton James to determine if the pt is going to need potassium supplement long-term.

## 2013-01-02 NOTE — Telephone Encounter (Signed)
F/up  Pt returned call about medication inquiry// please call back

## 2013-01-02 NOTE — Telephone Encounter (Signed)
THanks. Will discuss potassium after cath. cdm

## 2013-01-03 ENCOUNTER — Inpatient Hospital Stay (HOSPITAL_BASED_OUTPATIENT_CLINIC_OR_DEPARTMENT_OTHER)
Admission: RE | Admit: 2013-01-03 | Discharge: 2013-01-03 | Disposition: A | Discharge status: Home / Self Care | Payer: BC Managed Care – PPO | Source: Ambulatory Visit | Attending: Cardiovascular Disease | Admitting: Cardiovascular Disease

## 2013-01-03 ENCOUNTER — Encounter (HOSPITAL_BASED_OUTPATIENT_CLINIC_OR_DEPARTMENT_OTHER)
Admission: RE | Disposition: A | Discharge status: Home / Self Care | Payer: Self-pay | Source: Ambulatory Visit | Attending: Cardiovascular Disease

## 2013-01-03 DIAGNOSIS — R079 Chest pain, unspecified: Secondary | ICD-10-CM

## 2013-01-03 DIAGNOSIS — R9439 Abnormal result of other cardiovascular function study: Secondary | ICD-10-CM | POA: Insufficient documentation

## 2013-01-03 DIAGNOSIS — R0789 Other chest pain: Secondary | ICD-10-CM | POA: Insufficient documentation

## 2013-01-03 DIAGNOSIS — I359 Nonrheumatic aortic valve disorder, unspecified: Secondary | ICD-10-CM | POA: Insufficient documentation

## 2013-01-03 DIAGNOSIS — I10 Essential (primary) hypertension: Secondary | ICD-10-CM | POA: Insufficient documentation

## 2013-01-03 DIAGNOSIS — E785 Hyperlipidemia, unspecified: Secondary | ICD-10-CM | POA: Insufficient documentation

## 2013-01-03 SURGERY — JV LEFT HEART CATHETERIZATION WITH CORONARY ANGIOGRAM
Anesthesia: Moderate Sedation

## 2013-01-03 MED ORDER — SODIUM CHLORIDE 0.9 % IJ SOLN: 3.0000 mL | INTRAMUSCULAR | Status: DC | PRN

## 2013-01-03 MED ORDER — ONDANSETRON HCL 4 MG/2ML IJ SOLN: 4.0000 mg | Freq: Four times a day (QID) | INTRAMUSCULAR | Status: DC | PRN

## 2013-01-03 MED ORDER — SODIUM CHLORIDE 0.9 % IJ SOLN: 3.0000 mL | Freq: Two times a day (BID) | INTRAMUSCULAR | Status: DC

## 2013-01-03 MED ORDER — SODIUM CHLORIDE 0.9 % IV SOLN: 250.0000 mL | INTRAVENOUS | Status: DC | PRN

## 2013-01-03 MED ORDER — ACETAMINOPHEN 325 MG PO TABS: 650.0000 mg | ORAL_TABLET | ORAL | Status: DC | PRN

## 2013-01-03 MED ORDER — ASPIRIN 81 MG PO CHEW
324.0000 mg | CHEWABLE_TABLET | ORAL | Status: AC
  Administered 2013-01-03: 243 mg via ORAL

## 2013-01-03 MED ORDER — SODIUM CHLORIDE 0.9 % IV SOLN: INTRAVENOUS | Status: DC

## 2013-01-03 MED ORDER — SODIUM CHLORIDE 0.9 % IV SOLN: INTRAVENOUS | Status: AC

## 2013-01-03 MED ORDER — DIAZEPAM 5 MG PO TABS
5.0000 mg | ORAL_TABLET | ORAL | Status: AC
  Administered 2013-01-03: 5 mg via ORAL

## 2013-01-03 NOTE — OR Nursing (Signed)
Dr McAlhany at bedside to discuss results and treatment plan with pt and family 

## 2013-01-03 NOTE — CV Procedure (Signed)
    Cardiac Catheterization Operative Report  Kristine Jones [id number] 8/22/20148:57 AM Londell Moh, MD  Procedure Performed:  1. Left Heart Catheterization 2. Selective Coronary Angiography 3. Left ventricular angiogram  Operator: Verne Carrow, MD  Arterial access site:  Right radial artery.   Indication: 62 yo female with recent chest pain. Stress echo with possible apical ischemia.                                       Procedure Details: The risks, benefits, complications, treatment options, and expected outcomes were discussed with the patient. The patient and/or family concurred with the proposed plan, giving informed consent. The patient was brought to the cath lab after IV hydration was begun and oral premedication was given. The patient was further sedated with Versed and Fentanyl. The right wrist was assessed with an Allens test which was positive. The right wrist was prepped and draped in a sterile fashion. 1% lidocaine was used for local anesthesia. Using the modified Seldinger access technique, a 5 French sheath was placed in the right radial artery. 3 mg Verapamil was given through the sheath. 3500 units IV heparin was given. Standard diagnostic catheters were used to perform selective coronary angiography. A pigtail catheter was used to perform a left ventricular angiogram. The sheath was removed from the right radial artery and a Terumo hemostasis band was applied at the arteriotomy site on the right wrist.    There were no immediate complications. The patient was taken to the recovery area in stable condition.   Hemodynamic Findings: Central aortic pressure: 134/67 Left ventricular pressure: 135/18/19  Angiographic Findings:  Left main: No obstructive disease.   Left Anterior Descending Artery: Large caliber vessel that courses to the apex. There is a moderate caliber diagonal branch. No obstructive disease.  Circumflex Artery: Large caliber  vessel with large obtuse marginal branch. No obstructive disease.   Right Coronary Artery: Large dominant vessel with no obstructive disease.   Left Ventricular Angiogram: LVEF=65%.   Impression: 1. No angiographic evidence of CAD 2. Normal LV systolic function 3. Non-cardiac chest pain  Recommendations: No further ischemic workup.        Complications:  None. The patient tolerated the procedure well.

## 2013-01-03 NOTE — Interval H&P Note (Signed)
History and Physical Interval Note:  01/03/2013 8:17 AM  Kristine Jones  has presented today for cardiac catheterization  with the diagnosis of abnormal stress ECHO, chest pain  The various methods of treatment have been discussed with the patient and family. After consideration of risks, benefits and other options for treatment, the patient has consented to  Procedure(s): J[facility] LEFT HEART CATHETERIZATION WITH CORONARY ANGIOGRAM (N/A) as a surgical intervention .  The patient's history has been reviewed, patient examined, no change in status, stable for surgery.  I have reviewed the patient's chart and labs.  Questions were answered to the patient's satisfaction.    Cath Lab Visit (complete for each Cath Lab visit)  Clinical Evaluation Leading to the Procedure:   ACS: no  Non-ACS:    Anginal Classification: CCS I  Anti-ischemic medical therapy: No Therapy  Non-Invasive Test Results: Low-risk stress test findings: cardiac mortality <1%/year  Prior CABG: No previous CABG        Henderson Frampton

## 2013-01-03 NOTE — OR Nursing (Signed)
+  Allen's test right hand 

## 2013-01-03 NOTE — OR Nursing (Signed)
TR band removed, site level 0, pressure dressing and wrist splint applied, distal circulation intact, discharge instructions reviewed and signed, pt stated understanding, ambulated in hall without difficulty, transported to husband's car via wheelchair

## 2013-01-03 NOTE — H&P (View-Only) (Signed)
 History of Present Illness: 62 y.o. female with history of aortic insufficiency with possible bicuspid aortic valve, HTN, HLD who is here today for cardiac follow up. Patient was previously followed by Dr. Tysinger. She established with me in 2011.  She had undergone treadmill stress testing with Dr. Tysinger in 09/2008. There were some abnormalities on her ECGs but no further testing was pursued. Echo in 12/2010 with mild to moderate AI and mild MR. Echo 06/2012: EF 55-60%, normal wall motion, grade 1 diastolic dysfunction, AV trileaflet with mild AI, trivial MR, mild LAE, PASP 25. She was then seen in the emergency room 11/20/12 by Dr. Wall with chest pain. CEs were negative. Chest x-ray was non-acute. Chest pain was felt to be atypical and no further cardiac workup was pursued. She was then seen by her PCP for syncope. Triamterene/HCTZ was discontinued. She was seen in our office 12/06/12 by Scott Weaver, PA-C and noted syncopal events with bradycardia. No exertional discomfort but she did describe resting left sided chest discomfort. Stress echo on 12/20/12 with upsloping ST depression with exercise and apical hypokinesis, worrisome for ischemia.   She is here today to review the results of her stress test. No further chest pain. Very anxious.   Primary Care Physician: Walter Pharr  Past Medical History  Diagnosis Date  . Migraines   . Anxiety and depression   . Adenomatous colon polyp   . Osteopenia   . History of melanoma     Followed with Dr. Gruber  . Aortic insufficiency     a. 06/2012 Echo: EF 55-60%, Gr 1 DD, Mild AI, Triv MR, mildly dil LA, nl RV fxn.  . Varicose veins   . Leukocytosis     Mild  . HTN (hypertension)   . Hyperlipidemia   . Abnormal stress test     a. 09/2008, ST depression on ETT @ [facility], no further w/u.    Past Surgical History  Procedure Laterality Date  . Dilation and curettage of uterus    . High endocervical polyp removed    . Cholecystectomy    .  Femoral hernia repair    . Ovarian cyst removal    . Btl    . Melanoma excision      Current Outpatient Prescriptions  Medication Sig Dispense Refill  . acetaminophen (TYLENOL) 500 MG tablet Take 500 mg by mouth every 6 (six) hours as needed for pain.       . aspirin 81 MG EC tablet Take 81 mg by mouth daily.        . Calcium Carbonate-Vitamin D (CALCIUM-VITAMIN D3) 600-200 MG-UNIT TABS Take 3 tablets by mouth daily.        . Cholecalciferol (VITAMIN D) 1000 UNITS capsule Take 1,000 Units by mouth daily.       . fish oil-omega-3 fatty acids 1000 MG capsule Take 1 g by mouth daily.        . ibuprofen (ADVIL,MOTRIN) 200 MG tablet TAKE 4 TABLETS DAILY      . LIPITOR 10 MG tablet Take 5 mg by mouth daily. TAKE ONE HALF TABLET DAILY      . multivitamin (THERAGRAN) per tablet Take 1 tablet by mouth daily.        . ZOLMitriptan (ZOMIG) 2.5 MG tablet Take 2.5 mg by mouth as needed for migraine.        No current facility-administered medications for this visit.    Allergies  Allergen Reactions  . Naproxen       CAUSE GI PROBLEMS,SLEEPING OR HAZZY  . Penicillins Other (See Comments)    "flu like symptoms"    History   Social History  . Marital Status: Married    Spouse Name: N/A    Number of Children: N/A  . Years of Education: N/A   Occupational History  . Interior designer     Freelance   Social History Main Topics  . Smoking status: Never Smoker   . Smokeless tobacco: Not on file  . Alcohol Use: 0.5 oz/week    1 drink(s) per week     Comment: 1 glass of wine per week  . Drug Use: No  . Sexual Activity: Not on file   Other Topics Concern  . Not on file   Social History Narrative   Married   1 daughter   Gets regular exercise: Walking, yoga, weight lifting    Family History  Problem Relation Age of Onset  . Hypertension Mother   . Migraines Mother   . Hypertension Father   . Lung cancer Father     Review of Systems:  As stated in the HPI and otherwise  negative.   BP 174/104  Pulse 90  Ht 5' 9" (1.753 m)  Wt 159 lb (72.122 kg)  BMI 23.47 kg/m2  SpO2 98%  Physical Examination: General: Well developed, well nourished, NAD HEENT: OP clear, mucus membranes moist SKIN: warm, dry. No rashes. Neuro: No focal deficits Musculoskeletal: Muscle strength 5/5 all ext Psychiatric: Mood and affect normal Neck: No JVD, no carotid bruits, no thyromegaly, no lymphadenopathy. Lungs:Clear bilaterally, no wheezes, rhonci, crackles Cardiovascular: Regular rate and rhythm. No murmurs, gallops or rubs. Abdomen:Soft. Bowel sounds present. Non-tender.  Extremities: No lower extremity edema. Pulses are 2 + in the bilateral DP/PT.  Stress echo 12/20/12: - Stress ECG conclusions: The stress ECG was normal. - Staged echo: Echocardiographic findings were positive for stress-induced ischemia. Impressions: - Stress echocardiogram with no chest pain; nondiagnostic, upsloping ST depression; there appears to be hypokinesis of the apex with peak exercise on apical 4 chamber view consistent with ischemia.   Assessment and Plan:   1. Chest pain/Abnormal stress test: Will arrange cardiac cath on in the outpatient cath lab 01/07/13. Risks and benefits reviewed today. Pre-cath labs next week.   2. HTN: Elevated today. She has a medication at home that she has not started per primary care. Will discuss with primary care.  

## 2013-01-10 ENCOUNTER — Telehealth: Payer: Self-pay | Admitting: Cardiovascular Disease

## 2013-01-10 NOTE — Telephone Encounter (Signed)
Pt had heart cath a week ago and having still having an ache in her wrist, is this normal, pls advise [phone]

## 2013-01-10 NOTE — Telephone Encounter (Signed)
Spoke with patient who c/o right wrist "aching".  Patient had heart catheterization 8/22 through right radial artery.  Patient states she has been using the right arm some but with caution, following the protocol she was given in her discharge instructions.  Patient denies swelling to area, states the puncture site is slightly red without drainage.  Patient states the area is bruised with yellowish coloration and feels that the amount of bruising and discoloration is normal.  Patient states the pain is mostly on the top of her wrist and is worse when she wakes up.  Patient states the right wrist appears equal to the left wrist in size and shape and denies numbness/tingling in right hand.  Patient denies that the pain radiates up the right arm.  I advised patient that these symptoms sound appropriate for 1 week post procedure.  I advised patient to elevate the arm above the heart and to apply ice for 20 minutes 4-5 times per day.  I advised patient may take oral Tylenol for pain and if no relief, may take low dose of Ibuprofen.  Patient then inquired to date/time of next appointment.  The patient has no appointment scheduled; appointment for 9/17 was cancelled. The patient would like to see Dr. Clifton James to discuss the results of the cath.  I advised patient that Dr. Gibson Ramp progress notes from procedure state that no follow up is needed and that all questions were answered adequately.  Patient states she did not get to talk to him post procedure and would like to come in.  I advised patient that Dr. Clifton James and his primary nurse, Charlotte Crumb, RN are out of the office until Wed. And that I would route message to [facility] for follow up on Wednesday, 9/3.  Patient verbalized understanding and agreement with plan.

## 2013-01-14 NOTE — Telephone Encounter (Signed)
Pt states that she would like to see Dr Clifton James this week so an appointment has been scheduled for 01/15/13 @ 2:30, pt aware.

## 2013-01-14 NOTE — Telephone Encounter (Signed)
I will be glad to see her this week in the office if she needs to come in. I am there Wednesday and Thursday. Thayer Ohm

## 2013-01-14 NOTE — Telephone Encounter (Signed)
Follow up  ° ° ° ° °Pt is returning your call  °

## 2013-01-14 NOTE — Telephone Encounter (Signed)
**Note De-identified Kristine Jones Obfuscation** LMTCB

## 2013-01-15 ENCOUNTER — Ambulatory Visit (INDEPENDENT_AMBULATORY_CARE_PROVIDER_SITE_OTHER): Payer: BC Managed Care – PPO | Admitting: Cardiovascular Disease

## 2013-01-15 ENCOUNTER — Encounter: Payer: Self-pay | Admitting: Cardiovascular Disease

## 2013-01-15 VITALS — BP 140/98 | HR 76 | Ht 69.0 in | Wt 156.0 lb

## 2013-01-15 DIAGNOSIS — I10 Essential (primary) hypertension: Secondary | ICD-10-CM

## 2013-01-15 DIAGNOSIS — R079 Chest pain, unspecified: Secondary | ICD-10-CM

## 2013-01-15 NOTE — Patient Instructions (Addendum)
Your physician wants you to follow-up in:  12 months.  You will receive a reminder letter in the mail two months in advance. If you don't receive a letter, please call our office to schedule the follow-up appointment.   

## 2013-01-15 NOTE — Progress Notes (Signed)
History of Present Illness: 62 y.o. female with history of aortic insufficiency with possible bicuspid aortic valve, HTN, HLD who is here today for cardiac follow up. Patient was previously followed by Dr. Aleen Campi. She established with me in 2011. She had undergone treadmill stress testing with Dr. Aleen Campi in 09/2008. There were some abnormalities on her ECGs but no further testing was pursued. Echo in 12/2010 with mild to moderate AI and mild MR. Echo 06/2012: EF 55-60%, normal wall motion, grade 1 diastolic dysfunction, AV trileaflet with mild AI, trivial MR, mild LAE, PASP 25. She was then seen in the emergency room 11/20/12 by Dr. Daleen Squibb with chest pain. CEs were negative. Chest x-ray was non-acute. Chest pain was felt to be atypical and no further cardiac workup was pursued. She was then seen by her PCP for syncope. Triamterene/HCTZ was discontinued. She was seen in our office 12/06/12 by Tereso Newcomer, PA-C and noted syncopal events with bradycardia. No exertional discomfort but she did describe resting left sided chest discomfort. Stress echo on 12/20/12 with upsloping ST depression with exercise and apical hypokinesis, worrisome for ischemia. I arranged a cardiac cath on 01/03/13 which showed no evidence of coronary artery disease.   She is here today for follow up. She has had bruising at her right radial cath site. No pain today and bruising is resolved.    Primary Care Physician: Merri Brunette   Past Medical History  Diagnosis Date  . Migraines   . Anxiety and depression   . Adenomatous colon polyp   . Osteopenia   . History of melanoma     Followed with Dr. Danella Deis  . Aortic insufficiency     a. 06/2012 Echo: EF 55-60%, Gr 1 DD, Mild AI, Triv MR, mildly dil LA, nl RV fxn.  . Varicose veins   . Leukocytosis     Mild  . HTN (hypertension)   . Hyperlipidemia   . Abnormal stress test     a. 09/2008, ST depression on ETT @ [facility], no further w/u.    Past Surgical History  Procedure  Laterality Date  . Dilation and curettage of uterus    . High endocervical polyp removed    . Cholecystectomy    . Femoral hernia repair    . Ovarian cyst removal    . Btl    . Melanoma excision      Current Outpatient Prescriptions  Medication Sig Dispense Refill  . acetaminophen (TYLENOL) 500 MG tablet Take 500 mg by mouth every 6 (six) hours as needed for pain.       Marland Kitchen ALPRAZolam (XANAX) 0.5 MG tablet Take 0.5 tablets (0.25 mg total) by mouth 3 (three) times daily as needed for sleep.    0  . aspirin 81 MG EC tablet Take 81 mg by mouth daily.        . Calcium Carbonate-Vitamin D (CALCIUM-VITAMIN D3) 600-200 MG-UNIT TABS Take 3 tablets by mouth daily.        . Cholecalciferol (VITAMIN D) 1000 UNITS capsule Take 1,000 Units by mouth daily.       . fish oil-omega-3 fatty acids 1000 MG capsule Take 1 g by mouth daily.        . hydrochlorothiazide (HYDRODIURIL) 25 MG tablet Take 1 tablet (25 mg total) by mouth daily.  30 tablet  3  . ibuprofen (ADVIL,MOTRIN) 200 MG tablet TAKE 4 TABLETS DAILY      . LIPITOR 10 MG tablet Take 5 mg by mouth  daily. TAKE ONE HALF TABLET DAILY      . multivitamin (THERAGRAN) per tablet Take 1 tablet by mouth daily.        . traZODone (DESYREL) 50 MG tablet Take 0.5 tablets (25 mg total) by mouth at bedtime as needed for sleep.      Marland Kitchen ZOLMitriptan (ZOMIG) 2.5 MG tablet Take 2.5 mg by mouth as needed for migraine.        No current facility-administered medications for this visit.    Allergies  Allergen Reactions  . Naproxen     CAUSE GI PROBLEMS,SLEEPING OR HAZZY  . Penicillins Other (See Comments)    "flu like symptoms"    History   Social History  . Marital Status: Married    Spouse Name: N/A    Number of Children: N/A  . 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 Smoking status: Never Smoker   . Smokeless tobacco: Not on file  . Alcohol Use: 0.5 oz/week    1 drink(s)  per week     Comment: 1 glass of wine per week  . Drug Use: No  . Sexual Activity: Not on file   Other Topics Concern  . Not on file   Social History Narrative   Married   1 daughter   Gets regular exercise: Walking, yoga, weight lifting    Family History  Problem Relation Age of Onset  . Hypertension Mother   . Migraines Mother   . Hypertension Father   . Lung cancer Father     Review of Systems:  As stated in the HPI and otherwise negative.   BP 140/98  Pulse 76  Ht 5\' 9"  (1.753 m)  Wt 156 lb (70.761 kg)  BMI 23.03 kg/m2  Physical Examination: General: Well developed, well nourished, NAD HEENT: OP clear, mucus membranes moist SKIN: warm, dry. No rashes. Neuro: No focal deficits Musculoskeletal: Muscle strength 5/5 all ext Psychiatric: Mood and affect normal Neck: No JVD, no carotid bruits, no thyromegaly, no lymphadenopathy. Lungs:Clear bilaterally, no wheezes, rhonci, crackles Cardiovascular: Regular rate and rhythm. No murmurs, gallops or rubs. Abdomen:Soft. Bowel sounds present. Non-tender.  Extremities: No lower extremity edema. Pulses are 2 + in the bilateral DP/PT.  Cardiac cath 01/03/13: Angiographic Findings:  Left main: No obstructive disease.  Left Anterior Descending Artery: Large caliber vessel that courses to the apex. There is a moderate caliber diagonal branch. No obstructive disease.  Circumflex Artery: Large caliber vessel with large obtuse marginal branch. No obstructive disease.  Right Coronary Artery: Large dominant vessel with no obstructive disease.  Left Ventricular Angiogram: LVEF=65%.  Impression:  1. No angiographic evidence of CAD  2. Normal LV systolic function  3. Non-cardiac chest pain  Assessment and Plan:   1. Chest pain: Non-cardiac. Cardiac cath 01/03/13 with no evidence of CAD.   2. HTN: Elevated today but controlled at home. Followed in primary care.

## 2013-01-29 ENCOUNTER — Ambulatory Visit: Payer: BC Managed Care – PPO | Admitting: Cardiovascular Disease

## 2014-01-30 ENCOUNTER — Encounter: Payer: Self-pay | Admitting: Cardiovascular Disease

## 2014-01-30 ENCOUNTER — Ambulatory Visit (INDEPENDENT_AMBULATORY_CARE_PROVIDER_SITE_OTHER): Payer: BC Managed Care – PPO | Admitting: Cardiovascular Disease

## 2014-01-30 VITALS — BP 150/76 | HR 70 | Ht 69.0 in | Wt 159.8 lb

## 2014-01-30 DIAGNOSIS — I10 Essential (primary) hypertension: Secondary | ICD-10-CM

## 2014-01-30 DIAGNOSIS — I359 Nonrheumatic aortic valve disorder, unspecified: Secondary | ICD-10-CM

## 2014-01-30 NOTE — Patient Instructions (Signed)
Your physician wants you to follow-up in:  12 months. You will receive a reminder letter in the mail two months in advance. If you don't receive a letter, please call our office to schedule the follow-up appointment.  Your physician has requested that you have an echocardiogram. Echocardiography is a painless test that uses sound waves to create images of your heart. It provides your doctor with information about the size and shape of your heart and how well your heart's chambers and valves are working. This procedure takes approximately one hour. There are no restrictions for this procedure. To be done in 12 months--week or so prior to appt with Dr. Angelena Form

## 2014-01-30 NOTE — Progress Notes (Signed)
History of Present Illness: 63 y.o. female with history of aortic insufficiency with possible bicuspid aortic valve, HTN, HLD who is here today for cardiac follow up. Patient was previously followed by Dr. Glade Lloyd. She established with me in 2011. She had undergone treadmill stress testing with Dr. Glade Lloyd in 09/2008. There were some abnormalities on her ECGs but no further testing was pursued. Echo 06/2012: EF 55-60%, normal wall motion, grade 1 diastolic dysfunction, AV trileaflet with mild AI, trivial MR, mild LAE, PASP 25. She was then seen in the emergency room 11/20/12 by Dr. Verl Blalock with chest pain. CEs were negative. Chest x-ray was non-acute. Chest pain was felt to be atypical and no further cardiac workup was pursued. She was then seen by her PCP for syncope. Triamterene/HCTZ was discontinued. She was seen in our office 12/06/12 by Richardson Dopp, PA-C and noted syncopal events with bradycardia. No exertional discomfort but she did describe resting left sided chest discomfort. Stress echo on 12/20/12 with upsloping ST depression with exercise and apical hypokinesis, worrisome for ischemia. I arranged a cardiac cath on 01/03/13 which showed no evidence of coronary artery disease.   She is here today for follow up.  She is feeling well. No chest pain or SOB. No LE edema.    Primary Care Physician: Deland Pretty   Past Medical History  Diagnosis Date  . Migraines   . Anxiety and depression   . Adenomatous colon polyp   . Osteopenia   . History of melanoma     Followed with Dr. Tonia Brooms  . Aortic insufficiency     a. 06/2012 Echo: EF 55-60%, Gr 1 DD, Mild AI, Triv MR, mildly dil LA, nl RV fxn.  . Varicose veins   . Leukocytosis     Mild  . HTN (hypertension)   . Hyperlipidemia   . Abnormal stress test     a. 09/2008, ST depression on ETT @ [place], no further w/u.    Past Surgical History  Procedure Laterality Date  . Dilation and curettage of uterus    . High endocervical polyp  removed    . Cholecystectomy    . Femoral hernia repair    . Ovarian cyst removal    . Btl    . Melanoma excision      Current Outpatient Prescriptions  Medication Sig Dispense Refill  . acetaminophen (TYLENOL) 500 MG tablet Take 500 mg by mouth every 6 (six) hours as needed for pain.       Marland Kitchen ALPRAZolam (XANAX) 0.5 MG tablet Take 0.5 tablets (0.25 mg total) by mouth 3 (three) times daily as needed for sleep.    0  . aspirin 81 MG EC tablet Take 81 mg by mouth daily.        . Calcium Carbonate-Vitamin D (CALCIUM-VITAMIN D3) 600-200 MG-UNIT TABS Take 3 tablets by mouth daily.        . Cholecalciferol (VITAMIN D) 1000 UNITS capsule Take 1,000 Units by mouth daily.       . fish oil-omega-3 fatty acids 1000 MG capsule Take 1 g by mouth daily.        Marland Kitchen ibuprofen (ADVIL,MOTRIN) 200 MG tablet TAKE 4 TABLETS DAILY      . irbesartan (AVAPRO) 75 MG tablet Take 75 mg by mouth daily.      Marland Kitchen LIPITOR 10 MG tablet TAKE ONE HALF TABLET EVERY OTHER DAILY      . multivitamin (THERAGRAN) per tablet Take 1 tablet by mouth  daily.        Marland Kitchen ZOLMitriptan (ZOMIG) 2.5 MG tablet Take 2.5 mg by mouth daily as needed for migraine.        No current facility-administered medications for this visit.    Allergies  Allergen Reactions  . Ibuprofen Other (See Comments)    Grogginess  . Naproxen Other (See Comments)    CAUSE [facility] [place] OR HAZZY  . Penicillins Other (See Comments)    "flu like symptoms"    History   Social History  . Marital Status: Married    Spouse Name: N/A    Number of Children: N/A  . Years of Education: N/A   Occupational History  . Microbiologist   Social History Main Topics  . Smoking status: Never Smoker   . Smokeless tobacco: Not on file  . Alcohol Use: 0.5 oz/week    1 drink(s) per week     Comment: 1 glass of wine per week  . Drug Use: No  . Sexual Activity: Not on file   Other Topics Concern  . Not on file   Social History Narrative    Married   1 daughter   Gets regular exercise: Walking, yoga, weight lifting    Family History  Problem Relation Age of Onset  . Hypertension Mother   . Migraines Mother   . Hypertension Father   . Lung cancer Father     Review of Systems:  As stated in the HPI and otherwise negative.   BP 150/76  Pulse 70  Ht 5\' 9"  (1.753 m)  Wt 159 lb 12.8 oz (72.485 kg)  BMI 23.59 kg/m2  Physical Examination: General: Well developed, well nourished, NAD HEENT: OP clear, mucus membranes moist SKIN: warm, dry. No rashes. Neuro: No focal deficits Musculoskeletal: Muscle strength 5/5 all ext Psychiatric: Mood and affect normal Neck: No JVD, no carotid bruits, no thyromegaly, no lymphadenopathy. Lungs:Clear bilaterally, no wheezes, rhonci, crackles Cardiovascular: Regular rate and rhythm. No murmurs, gallops or rubs. Abdomen:Soft. Bowel sounds present. Non-tender.  Extremities: No lower extremity edema. Pulses are 2 + in the bilateral DP/PT.  Echo 07/11/12: Left ventricle: The cavity size was normal. Wall thickness was normal. Systolic function was normal. The estimated ejection fraction was in the range of 55% to 60%. Wall motion was normal; there were no regional wall motion abnormalities. Doppler parameters are consistent with abnormal left ventricular relaxation (grade 1 diastolic dysfunction). - Aortic valve: There was no stenosis. Mild regurgitation. - Mitral valve: Trivial regurgitation. - Left atrium: The atrium was mildly dilated. - Right ventricle: The cavity size was normal. Systolic function was normal. - Tricuspid valve: Peak RV-RA gradient:83mm Hg (S). - Pulmonary arteries: PA peak pressure: 58mm Hg (S). - Inferior vena cava: The vessel was normal in size; the respirophasic diameter changes were in the normal range (= 50%); findings are consistent with normal central venous pressure. Impressions:  - Normal LV size and systolic function, EF 24-58%. Normal RV size and  systolic function. Mild aortic insufficiency.  Cardiac cath 01/03/13: Angiographic Findings:  Left main: No obstructive disease.  Left Anterior Descending Artery: Large caliber vessel that courses to the apex. There is a moderate caliber diagonal branch. No obstructive disease.  Circumflex Artery: Large caliber vessel with large obtuse marginal branch. No obstructive disease.  Right Coronary Artery: Large dominant vessel with no obstructive disease.  Left Ventricular Angiogram: LVEF=65%.  Impression:  1. No angiographic evidence of CAD  2. Normal LV systolic  function  3. Non-cardiac chest pain  EKG: NSR, rate 70 bpm. Poor R wave progression precordial leads. Unchanged.   Assessment and Plan:   1. Chest pain: No recurrence. Cardiac cath 01/03/13 with no evidence of CAD.   2. HTN: Elevated today but controlled at home. She has white coat HTN. Followed in primary care.  3. Aortic insufficiency: Mild by echo 2014. Repeat echo October 2016.

## 2014-06-05 ENCOUNTER — Telehealth: Payer: Self-pay | Admitting: Internal Medicine

## 2014-06-05 NOTE — Telephone Encounter (Signed)
S/W PATIENT AND GAVE NP APPT FOR 02/16 @ 11 W/DR. MIOHAMED.  REFERRING DR. [place]

## 2014-06-22 ENCOUNTER — Other Ambulatory Visit: Payer: Self-pay | Admitting: *Deleted

## 2014-06-22 DIAGNOSIS — D702 Other drug-induced agranulocytosis: Secondary | ICD-10-CM

## 2014-06-23 ENCOUNTER — Telehealth: Payer: Self-pay | Admitting: Internal Medicine

## 2014-06-23 ENCOUNTER — Ambulatory Visit (HOSPITAL_BASED_OUTPATIENT_CLINIC_OR_DEPARTMENT_OTHER): Payer: BLUE CROSS/BLUE SHIELD | Admitting: Internal Medicine

## 2014-06-23 ENCOUNTER — Other Ambulatory Visit (HOSPITAL_BASED_OUTPATIENT_CLINIC_OR_DEPARTMENT_OTHER): Payer: BLUE CROSS/BLUE SHIELD

## 2014-06-23 ENCOUNTER — Encounter: Payer: Self-pay | Admitting: Internal Medicine

## 2014-06-23 ENCOUNTER — Ambulatory Visit: Payer: BLUE CROSS/BLUE SHIELD

## 2014-06-23 VITALS — BP 185/94 | HR 100 | Temp 97.9°F | Resp 20 | Ht 69.0 in | Wt 158.1 lb

## 2014-06-23 DIAGNOSIS — D72819 Decreased white blood cell count, unspecified: Secondary | ICD-10-CM

## 2014-06-23 DIAGNOSIS — D702 Other drug-induced agranulocytosis: Secondary | ICD-10-CM

## 2014-06-23 DIAGNOSIS — Z801 Family history of malignant neoplasm of trachea, bronchus and lung: Secondary | ICD-10-CM

## 2014-06-23 LAB — CBC WITH DIFFERENTIAL/PLATELET
BASO%: 0.5 % (ref 0.0–2.0)
Basophils Absolute: 0 10*3/uL (ref 0.0–0.1)
EOS%: 1.3 % (ref 0.0–7.0)
Eosinophils Absolute: 0.1 10*3/uL (ref 0.0–0.5)
HCT: 44.2 % (ref 34.8–46.6)
HGB: 14.3 g/dL (ref 11.6–15.9)
LYMPH#: 1.3 10*3/uL (ref 0.9–3.3)
LYMPH%: 34.3 % (ref 14.0–49.7)
MCH: 28.8 pg (ref 25.1–34.0)
MCHC: 32.4 g/dL (ref 31.5–36.0)
MCV: 89.1 fL (ref 79.5–101.0)
MONO#: 0.2 10*3/uL (ref 0.1–0.9)
MONO%: 5.4 % (ref 0.0–14.0)
NEUT#: 2.2 10*3/uL (ref 1.5–6.5)
NEUT%: 58.5 % (ref 38.4–76.8)
Platelets: 291 10*3/uL (ref 145–400)
RBC: 4.96 10*6/uL (ref 3.70–5.45)
RDW: 13.4 % (ref 11.2–14.5)
WBC: 3.7 10*3/uL — ABNORMAL LOW (ref 3.9–10.3)

## 2014-06-23 LAB — COMPREHENSIVE METABOLIC PANEL (CC13)
ALBUMIN: 4.5 g/dL (ref 3.5–5.0)
ALT: 21 U/L (ref 0–55)
ANION GAP: 10 meq/L (ref 3–11)
AST: 27 U/L (ref 5–34)
Alkaline Phosphatase: 75 U/L (ref 40–150)
BUN: 12.5 mg/dL (ref 7.0–26.0)
CHLORIDE: 103 meq/L (ref 98–109)
CO2: 30 meq/L — AB (ref 22–29)
CREATININE: 0.7 mg/dL (ref 0.6–1.1)
Calcium: 10.2 mg/dL (ref 8.4–10.4)
EGFR: 90 mL/min/{1.73_m2} — AB (ref >90)
Glucose: 102 mg/dl (ref 70–140)
POTASSIUM: 4 meq/L (ref 3.5–5.1)
SODIUM: 143 meq/L (ref 136–145)
TOTAL PROTEIN: 7.7 g/dL (ref 6.4–8.3)
Total Bilirubin: 0.43 mg/dL (ref 0.20–1.20)

## 2014-06-23 LAB — LACTATE DEHYDROGENASE (CC13): LDH: 197 U/L (ref 125–245)

## 2014-06-23 NOTE — Progress Notes (Signed)
[place] [phone]   [phone]  CONSULT NOTE  REFERRING PHYSICIAN: Dr. Deland Pretty  REASON FOR CONSULTATION:  64 years old white female with leukocytopenia  HPI Kristine Jones is a 65 y.o. 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 The patient was seen by her primary care physician recently for routine annual examination and repeat CBC on 06/02/2014 showed decreased total white blood count of 3.0 down from 5.7 on 05/16/2013. The patient also had reduced percentage of neutrophil count. She was referred to me today for further evaluation and recommendation regarding her condition. The patient is feeling fine and has no complaints. She denied having any recurrent infection. She does not take much of over-the-counter medication except for occasional ibuprofen. She is currently on Lipitor 5 mg every other day for dyslipidemia.  Reviewing her previous records the patient has a history of leukocytopenia since 2002.  On 02/26/2001 her total white blood count was 3.9, on 02/04/2002 total white blood count was 3.9. 02/23/2006 total white blood count was 3.1, 11/08/2006 total white blood count was 2.9. 08/19/2008 total white blood count was 3.1, 10/24/2010 total white blood count was 3.1, 10/27/2011 white blood count was 3.5. Over this years The patient denied having any significant issues. She has no bleeding, bruises or ecchymosis. The patient denied having any significant chest pain, shortness breath, cough or hemoptysis. She denied having any nausea or vomiting. She denied having any significant fever or chills. She was a little bit anxious today. Family history significant for a father died from lung cancer at age 78 and  mother had hypertension. The patient is married and was accompanied by her husband, Kristine Jones. She has 1 daughter. She is to work as an Futures trader. She has no history of smoking but drinks alcohol occasionally and no history of drug abuse.Marland Kitchen  HPI  Past Medical History  Diagnosis Date  . Migraines   . Anxiety and depression   . Adenomatous colon polyp   . Osteopenia   . History of melanoma     Followed with Dr. Tonia Brooms  . Aortic insufficiency     a. 06/2012 Echo: EF 55-60%, Gr 1 DD, Mild AI, Triv MR, mildly dil LA, nl RV fxn.  . Varicose veins   . Leukocytosis     Mild  . HTN (hypertension)   . Hyperlipidemia   . Abnormal stress test     a. 09/2008, ST depression on ETT @ [place], no further w/u.    Past Surgical History  Procedure Laterality Date  . Dilation and curettage of uterus    . High endocervical polyp removed    . Cholecystectomy    . Femoral hernia repair    . Ovarian cyst removal    . Btl    . Melanoma excision      Family History  Problem Relation Age of Onset  . Hypertension Mother   . Migraines Mother   . Hypertension Father   . Lung cancer Father     Social History History  Substance Use Topics  . Smoking status: Never Smoker   . Smokeless tobacco: Not on file  . Alcohol Use: 0.5 oz/week    1 Not specified per week     Comment: 1 glass of wine per week    Allergies  Allergen Reactions  . Ibuprofen Other (See Comments)    Grogginess  . Naproxen Other (See Comments)    CAUSE [facility] [place] OR HAZZY  . Penicillins Other (See Comments)    "flu like symptoms"    Current Outpatient Prescriptions  Medication Sig Dispense Refill  . ALPRAZolam (XANAX) 0.5 MG tablet Take 0.25 mg by mouth as needed.   0  . amLODipine (NORVASC) 2.5 MG tablet Take 2.5 mg by mouth daily.    Marland Kitchen aspirin 81 MG EC tablet Take 81 mg by mouth daily.      . Calcium Carbonate-Vitamin D (CALCIUM-VITAMIN D3) 600-200 MG-UNIT TABS Take 3 tablets by mouth daily.      .  Cholecalciferol (VITAMIN D) 1000 UNITS capsule Take 1,000 Units by mouth daily.     . fish oil-omega-3 fatty acids 1000 MG capsule Take 1 g by mouth daily.      Marland Kitchen ibuprofen (ADVIL,MOTRIN) 200 MG tablet as needed. TAKE 4 TABLETS DAILY    . LIPITOR 10 MG tablet TAKE ONE HALF TABLET EVERY OTHER DAILY    . multivitamin (THERAGRAN) per tablet Take 1 tablet by mouth daily.      Marland Kitchen ZOLMitriptan (ZOMIG) 2.5 MG tablet Take 2.5 mg by mouth as needed for migraine.     Marland Kitchen acetaminophen (TYLENOL) 500 MG tablet Take 500 mg by mouth every 6 (six) hours as needed for pain.      No current facility-administered medications for this visit.    Review of Systems  Constitutional: negative Eyes: negative Ears, nose, mouth, throat, and face: negative Respiratory: negative Cardiovascular: negative Gastrointestinal: negative Genitourinary:negative Integument/breast: negative Hematologic/lymphatic: negative Musculoskeletal:negative Neurological: negative Behavioral/Psych: positive for anxiety Endocrine: negative Allergic/Immunologic: negative  Physical Exam  OPF:YTWKM, healthy, no distress, well nourished, well developed and anxious SKIN: skin color, texture, turgor are normal, no rashes or significant lesions HEAD: Normocephalic, No masses, lesions, tenderness or abnormalities EYES: normal, PERRLA EARS: External ears normal, Canals clear OROPHARYNX:no exudate and no erythema  NECK: supple, no adenopathy, no JVD LYMPH:  no palpable lymphadenopathy, no hepatosplenomegaly BREAST:not examined LUNGS: clear to auscultation , and palpation HEART: regular rate & rhythm and no murmurs ABDOMEN:abdomen soft, non-tender, normal bowel sounds and no masses or organomegaly BACK: Back symmetric, no curvature., No CVA tenderness EXTREMITIES:no joint deformities, effusion, or inflammation, no edema, no skin discoloration, no clubbing  NEURO: alert & oriented x 3 with fluent speech, no focal motor/sensory  deficits  PERFORMANCE STATUS: ECOG 0  LABORATORY DATA: Lab Results  Component Value Date   WBC 3.7* 06/23/2014   HGB 14.3 06/23/2014   HCT 44.2 06/23/2014   MCV 89.1 06/23/2014   PLT 291 06/23/2014      Chemistry      Component Value Date/Time   NA 143 06/23/2014 1411   NA 137 01/01/2013 1057   K 4.0 06/23/2014 1411   K 3.5 01/01/2013 1057   CL 101 01/01/2013 1057   CO2 30* 06/23/2014 1411   CO2 32 01/01/2013 1057   BUN 12.5 06/23/2014 1411   BUN 15 01/01/2013 1057   CREATININE 0.7 06/23/2014 1411   CREATININE 0.7 01/01/2013 1057      Component Value Date/Time   CALCIUM 10.2 06/23/2014 1411   CALCIUM 9.9 01/01/2013 1057   ALKPHOS 75 06/23/2014 1411   AST 27 06/23/2014 1411   ALT 21 06/23/2014 1411   BILITOT 0.43 06/23/2014 1411       RADIOGRAPHIC STUDIES: No results found.  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 The etiology is unclear but that could be secondary to medications including Lipitor or ibuprofen. Other activities, BX excluded at this point.   PLAN: I had a lengthy discussion with the patient and her husband. I assured the patient that her leukocytopenia is very mild and she has normal absolute neutrophil count on the bloodwork performed earlier today.  I discussed with the patient several options including continuous observation and monitoring versus ordering several lab work to evaluate her leukocytopenia including hepatitis panel, HIV, rheumatoid factor, ANA. I also discussed with the patient and consideration of a bone marrow biopsy and aspirate to rule out any other underlying etiology. After the discussion the patient and her husband would like to continue on observation for now. I would see her back for follow-up visit in 3 months with repeat CBC. If she continues to have decline in her leukocyte count or absolute neutrophil count, I would consider the patient for  further investigation of her condition. I strongly recommended for her to avoid any NSAIDs. She was advised to call me immediately if she has any concerning symptoms in the interval.  The patient voices understanding of current disease status and treatment options and is in agreement with the current care plan.  All questions were answered. The patient knows to call the clinic with any problems, questions or concerns. We can certainly see the patient much sooner if necessary.  Thank you so much for allowing me to participate in the care of Kristine Jones. I will continue to follow up the patient with you and assist in her care.  I spent 40 minutes counseling the patient face to face. The total time spent in the appointment was 60 minutes.  Disclaimer: This note was dictated with voice recognition software. Similar sounding words can inadvertently be transcribed and may not be corrected upon review.   Kanisha Duba K. June 23, 2014, 3:20 PM

## 2014-06-23 NOTE — Telephone Encounter (Signed)
Gave avs & calendar for May. °

## 2014-06-23 NOTE — Progress Notes (Signed)
Checked in new pt with no financial concerns at this time.  Pt states she's here for a hematology concern so financial assistance may not be needed but she has Raquel's card for any billing questions or concerns.

## 2014-07-01 ENCOUNTER — Ambulatory Visit: Payer: Self-pay | Admitting: Internal Medicine

## 2014-07-01 ENCOUNTER — Ambulatory Visit: Payer: Self-pay

## 2014-07-01 ENCOUNTER — Other Ambulatory Visit: Payer: Self-pay

## 2014-08-20 ENCOUNTER — Telehealth: Payer: Self-pay | Admitting: Internal Medicine

## 2014-08-20 NOTE — Telephone Encounter (Signed)
s.w. pt and r/s appt for 1st afternoon available .Marland KitchenMarland KitchenMarland KitchenMarland Kitchenpt ok and aware

## 2014-09-22 ENCOUNTER — Other Ambulatory Visit: Payer: BLUE CROSS/BLUE SHIELD

## 2014-09-22 ENCOUNTER — Ambulatory Visit: Payer: BLUE CROSS/BLUE SHIELD | Admitting: Internal Medicine

## 2014-10-13 ENCOUNTER — Other Ambulatory Visit (HOSPITAL_BASED_OUTPATIENT_CLINIC_OR_DEPARTMENT_OTHER): Payer: BLUE CROSS/BLUE SHIELD

## 2014-10-13 ENCOUNTER — Encounter: Payer: Self-pay | Admitting: Internal Medicine

## 2014-10-13 ENCOUNTER — Ambulatory Visit (HOSPITAL_BASED_OUTPATIENT_CLINIC_OR_DEPARTMENT_OTHER): Payer: BLUE CROSS/BLUE SHIELD | Admitting: Internal Medicine

## 2014-10-13 VITALS — BP 156/89 | HR 87 | Temp 98.0°F | Resp 17 | Ht 69.0 in | Wt 158.3 lb

## 2014-10-13 DIAGNOSIS — D72819 Decreased white blood cell count, unspecified: Secondary | ICD-10-CM

## 2014-10-13 LAB — CBC WITH DIFFERENTIAL/PLATELET
BASO%: 0.5 % (ref 0.0–2.0)
Basophils Absolute: 0 10*3/uL (ref 0.0–0.1)
EOS ABS: 0.1 10*3/uL (ref 0.0–0.5)
EOS%: 1.7 % (ref 0.0–7.0)
HCT: 43 % (ref 34.8–46.6)
HGB: 14.3 g/dL (ref 11.6–15.9)
LYMPH%: 25.3 % (ref 14.0–49.7)
MCH: 28.5 pg (ref 25.1–34.0)
MCHC: 33.2 g/dL (ref 31.5–36.0)
MCV: 86 fL (ref 79.5–101.0)
MONO#: 0.3 10*3/uL (ref 0.1–0.9)
MONO%: 5.9 % (ref 0.0–14.0)
NEUT%: 66.6 % (ref 38.4–76.8)
NEUTROS ABS: 3.1 10*3/uL (ref 1.5–6.5)
PLATELETS: 303 10*3/uL (ref 145–400)
RBC: 5 10*6/uL (ref 3.70–5.45)
RDW: 13.9 % (ref 11.2–14.5)
WBC: 4.6 10*3/uL (ref 3.9–10.3)
lymph#: 1.2 10*3/uL (ref 0.9–3.3)

## 2014-10-13 LAB — LACTATE DEHYDROGENASE (CC13): LDH: 202 U/L (ref 125–245)

## 2014-10-13 NOTE — Progress Notes (Signed)
[place] [place] [phone]   [phone]) [phone]  OFFICE PROGRESS NOTE  Horatio Pel, MD [address] [place] [address]  DIAGNOSIS: Leukocytopenia, most likely drug-induced, resolved  PRIOR THERAPY: None  CURRENT THERAPY: None  INTERVAL HISTORY: Kristine Jones 64 y.o. female returns to the clinic today for follow-up visit. The patient is feeling fine today with no specific complaints. She was seen 3 months ago for evaluation of leukocytopenia. Her total white blood count was slightly lower than the normal range. I had several options discussed with the patient at that time including observation and close monitoring which she preferred. She came today for reevaluation with repeat CBC. She denied having any significant weight loss or night sweats. She has no nausea or vomiting. The patient denied having any palpable lymphadenopathy. She has no bleeding issues.  MEDICAL HISTORY: Past Medical History  Diagnosis Date  . Migraines   . Anxiety and depression   . Adenomatous colon polyp   . Osteopenia   . History of melanoma     Followed with Dr. Tonia Brooms  . Aortic insufficiency     a. 06/2012 Echo: EF 55-60%, Gr 1 DD, Mild AI, Triv MR, mildly dil LA, nl RV fxn.  . Varicose veins   . Leukocytosis     Mild  . HTN (hypertension)   . Hyperlipidemia   . Abnormal stress test     a. 09/2008, ST depression on ETT @ [facility], no further w/u.    ALLERGIES:  is allergic to ibuprofen; naproxen; and penicillins.  MEDICATIONS:  Current Outpatient Prescriptions  Medication Sig Dispense Refill  . ALPRAZolam (XANAX) 0.5 MG tablet Take 0.25 mg by mouth as needed.   0  . amLODipine (NORVASC) 2.5 MG tablet Take 2.5 mg by mouth daily.    Marland Kitchen aspirin 81 MG EC tablet Take 81 mg by mouth daily.      . Calcium Carbonate-Vitamin D (CALCIUM-VITAMIN D3) 600-200 MG-UNIT TABS Take 3 tablets by mouth daily.      . Cholecalciferol (VITAMIN D) 1000  UNITS capsule Take 1,000 Units by mouth daily.     . fish oil-omega-3 fatty acids 1000 MG capsule Take 1 g by mouth daily.      Marland Kitchen LIPITOR 10 MG tablet TAKE ONE HALF TABLET EVERY OTHER DAILY    . multivitamin (THERAGRAN) per tablet Take 1 tablet by mouth daily.      Marland Kitchen acetaminophen (TYLENOL) 500 MG tablet Take 500 mg by mouth every 6 (six) hours as needed for pain.     Marland Kitchen DHA-EPA-VITAMIN E PO Take 1 capsule by mouth daily.    Marland Kitchen ibuprofen (ADVIL,MOTRIN) 200 MG tablet as needed. TAKE 4 TABLETS DAILY    . ZOLMitriptan (ZOMIG) 2.5 MG tablet Take 2.5 mg by mouth as needed for migraine.      No current facility-administered medications for this visit.    SURGICAL HISTORY:  Past Surgical History  Procedure Laterality Date  . Dilation and curettage of uterus    . High endocervical polyp removed    . Cholecystectomy    . Femoral hernia repair    . Ovarian cyst removal    . Btl    . Melanoma excision      REVIEW OF SYSTEMS:  A comprehensive review of systems was negative.   PHYSICAL EXAMINATION: General appearance: alert, cooperative and no distress Head: Normocephalic, without obvious abnormality, atraumatic Neck: no adenopathy, no JVD, supple, symmetrical, trachea midline  and thyroid not enlarged, symmetric, no tenderness/mass/nodules Lymph nodes: Cervical, supraclavicular, and axillary nodes normal. Resp: clear to auscultation bilaterally Back: symmetric, no curvature. ROM normal. No CVA tenderness. Cardio: regular rate and rhythm, S1, S2 normal, no murmur, click, rub or gallop GI: soft, non-tender; bowel sounds normal; no masses,  no organomegaly Extremities: extremities normal, atraumatic, no cyanosis or edema  ECOG PERFORMANCE STATUS: 0 - Asymptomatic  Blood pressure 156/89, pulse 87, temperature 98 F (36.7 C), temperature source Oral, resp. rate 17, height 5\' 9"  (1.753 m), weight 158 lb 4.8 oz (71.804 kg), SpO2 100 %.  LABORATORY DATA: Lab Results  Component Value Date   WBC  4.6 10/13/2014   HGB 14.3 10/13/2014   HCT 43.0 10/13/2014   MCV 86.0 10/13/2014   PLT 303 10/13/2014      Chemistry      Component Value Date/Time   NA 143 06/23/2014 1411   NA 137 01/01/2013 1057   K 4.0 06/23/2014 1411   K 3.5 01/01/2013 1057   CL 101 01/01/2013 1057   CO2 30* 06/23/2014 1411   CO2 32 01/01/2013 1057   BUN 12.5 06/23/2014 1411   BUN 15 01/01/2013 1057   CREATININE 0.7 06/23/2014 1411   CREATININE 0.7 01/01/2013 1057      Component Value Date/Time   CALCIUM 10.2 06/23/2014 1411   CALCIUM 9.9 01/01/2013 1057   ALKPHOS 75 06/23/2014 1411   AST 27 06/23/2014 1411   ALT 21 06/23/2014 1411   BILITOT 0.43 06/23/2014 1411       RADIOGRAPHIC STUDIES: No results found.  ASSESSMENT AND PLAN: This is a very pleasant 64 years old white female who presented for evaluation of leukocytopenia and this is completely resolved at this point. This was most likely drug-induced and improved after the patient discontinued her treatment with ibuprofen. I recommended for the patient to continue on observation with routine follow-up visit with her primary care physician. I don't see a need to do any further investigation this patient at this point but will be happy to see her in the future if she has any concerning abnormalities. The patient voices understanding of current disease status and treatment options and is in agreement with the current care plan.  All the current plan.questions were answered. The patient knows to call the clinic with any problems, questions or concerns. We can certainly see the patient much sooner if necessary.  Disclaimer: This note was dictated with voice recognition software. Similar sounding words can inadvertently be transcribed and may not be corrected upon review.

## 2015-01-01 ENCOUNTER — Other Ambulatory Visit: Payer: Self-pay | Admitting: Plastic Surgery

## 2015-01-01 DIAGNOSIS — D1720 Benign lipomatous neoplasm of skin and subcutaneous tissue of unspecified limb: Secondary | ICD-10-CM

## 2015-01-14 ENCOUNTER — Other Ambulatory Visit: Payer: BLUE CROSS/BLUE SHIELD

## 2015-02-03 ENCOUNTER — Ambulatory Visit
Admission: RE | Admit: 2015-02-03 | Discharge: 2015-02-03 | Disposition: A | Discharge status: Home / Self Care | Payer: BLUE CROSS/BLUE SHIELD | Source: Ambulatory Visit | Attending: Plastic Surgery | Admitting: Plastic Surgery

## 2015-02-03 DIAGNOSIS — D1720 Benign lipomatous neoplasm of skin and subcutaneous tissue of unspecified limb: Secondary | ICD-10-CM

## 2015-02-03 MED ORDER — GADOBENATE DIMEGLUMINE 529 MG/ML IV SOLN
14.0000 mL | Freq: Once | INTRAVENOUS | Status: AC | PRN
  Administered 2015-02-03: 14 mL via INTRAVENOUS

## 2015-03-08 ENCOUNTER — Other Ambulatory Visit: Payer: Self-pay | Admitting: Cardiovascular Disease

## 2015-03-08 ENCOUNTER — Other Ambulatory Visit (HOSPITAL_COMMUNITY): Payer: BLUE CROSS/BLUE SHIELD

## 2015-03-08 DIAGNOSIS — I351 Nonrheumatic aortic (valve) insufficiency: Secondary | ICD-10-CM

## 2015-03-09 ENCOUNTER — Other Ambulatory Visit: Payer: Self-pay

## 2015-03-09 ENCOUNTER — Ambulatory Visit (HOSPITAL_COMMUNITY): Payer: BLUE CROSS/BLUE SHIELD | Attending: Internal Medicine

## 2015-03-09 DIAGNOSIS — I340 Nonrheumatic mitral (valve) insufficiency: Secondary | ICD-10-CM | POA: Diagnosis not present

## 2015-03-09 DIAGNOSIS — I351 Nonrheumatic aortic (valve) insufficiency: Secondary | ICD-10-CM | POA: Insufficient documentation

## 2015-03-09 DIAGNOSIS — E785 Hyperlipidemia, unspecified: Secondary | ICD-10-CM | POA: Insufficient documentation

## 2015-03-09 DIAGNOSIS — I10 Essential (primary) hypertension: Secondary | ICD-10-CM | POA: Insufficient documentation

## 2015-03-10 ENCOUNTER — Encounter (HOSPITAL_BASED_OUTPATIENT_CLINIC_OR_DEPARTMENT_OTHER): Payer: Self-pay | Admitting: *Deleted

## 2015-03-11 ENCOUNTER — Ambulatory Visit (INDEPENDENT_AMBULATORY_CARE_PROVIDER_SITE_OTHER): Payer: BLUE CROSS/BLUE SHIELD | Admitting: Cardiovascular Disease

## 2015-03-11 VITALS — BP 120/68 | Ht 69.0 in | Wt 160.0 lb

## 2015-03-11 DIAGNOSIS — I10 Essential (primary) hypertension: Secondary | ICD-10-CM

## 2015-03-11 DIAGNOSIS — I351 Nonrheumatic aortic (valve) insufficiency: Secondary | ICD-10-CM | POA: Diagnosis not present

## 2015-03-11 DIAGNOSIS — R072 Precordial pain: Secondary | ICD-10-CM | POA: Diagnosis not present

## 2015-03-11 NOTE — Patient Instructions (Signed)
Medication Instructions:  Your physician recommends that you continue on your current medications as directed. Please refer to the Current Medication list given to you today.   Labwork: none  Testing/Procedures: none  Follow-Up: Your physician wants you to follow-up in:  12 months.  You will receive a reminder letter in the mail two months in advance. If you don't receive a letter, please call our office to schedule the follow-up appointment.     If you need a refill on your cardiac medications before your next appointment, please call your pharmacy.

## 2015-03-11 NOTE — Progress Notes (Signed)
Chief Complaint  Patient presents with  . Follow-up    HTN, Aortic insufficiency    History of Present Illness: 64 y.o. female with history of aortic insufficiency with possible bicuspid aortic valve, HTN, HLD who is here today for cardiac follow up. Patient was previously followed by Dr. Glade Lloyd. She established with me in 2011. She has known mild AI. She was seen in our office 12/06/12 by Richardson Dopp, PA-C and noted syncopal events with bradycardia. No exertional discomfort but she did describe resting left sided chest discomfort. Stress echo on 12/20/12 with upsloping ST depression with exercise and apical hypokinesis, worrisome for ischemia. I arranged a cardiac cath on 01/03/13 which showed no evidence of coronary artery disease. Echo 03/09/15 with normal LV systolic function, mild AI.   She is here today for follow up.  She is feeling well. No chest pain or SOB. No LE edema.    Primary Care Physician: Deland Pretty   Past Medical History  Diagnosis Date  . Migraines   . Anxiety and depression   . Adenomatous colon polyp   . Osteopenia   . History of melanoma     Followed with Dr. Tonia Brooms  . Aortic insufficiency     a. 06/2012 Echo: EF 55-60%, Gr 1 DD, Mild AI, Triv MR, mildly dil LA, nl RV fxn.  . Varicose veins   . Leukocytosis     Mild  . HTN (hypertension)   . Hyperlipidemia   . Abnormal stress test     a. 09/2008, ST depression on ETT @ [facility], no further w/u.  Marland Kitchen Anxiety     Past Surgical History  Procedure Laterality Date  . Dilation and curettage of uterus    . High endocervical polyp removed    . Cholecystectomy    . Femoral hernia repair    . Ovarian cyst removal    . Btl    . Melanoma excision  2003    right calf    Current Outpatient Prescriptions  Medication Sig Dispense Refill  . amLODipine (NORVASC) 2.5 MG tablet Take 2.5 mg by mouth daily.    Marland Kitchen aspirin 81 MG EC tablet Take 81 mg by mouth daily.      . Calcium Carbonate-Vitamin D  (CALCIUM-VITAMIN D3) 600-200 MG-UNIT TABS Take 2 tablets by mouth daily.     . Cholecalciferol (VITAMIN D) 1000 UNITS capsule Take 1,000 Units by mouth daily.     . fish oil-omega-3 fatty acids 1000 MG capsule Take 1 g by mouth daily.      Marland Kitchen LIPITOR 10 MG tablet Take 5 mg by mouth every other day. TAKE ONE HALF TABLET EVERY OTHER DAILY    . multivitamin (THERAGRAN) per tablet Take 1 tablet by mouth daily.      . sertraline (ZOLOFT) 25 MG tablet Take 25 mg by mouth daily.    Marland Kitchen ZOLMitriptan (ZOMIG) 2.5 MG tablet Take 2.5 mg by mouth as needed for migraine.      No current facility-administered medications for this visit.    Allergies  Allergen Reactions  . Indocin [Indomethacin] Other (See Comments)    Stomach upset  . Ibuprofen Other (See Comments)    Grogginess  . Naproxen Other (See Comments)    CAUSE [facility] OR HAZZY  . Penicillins Other (See Comments)    "flu like symptoms"  . Triamterene Other (See Comments)    False gout    Social History   Social History  .  Marital Status: Married    Spouse Name: N/A  . Number of Children: N/A  . Years of Education: N/A   Occupational History  . Microbiologist   Social History Main Topics  . Smoking status: Never Smoker   . Smokeless tobacco: Not on file  . Alcohol Use: 0.5 oz/week    1 Standard drinks or equivalent per week     Comment: 1 glass of wine per week  . Drug Use: No  . Sexual Activity: Not on file   Other Topics Concern  . Not on file   Social History Narrative   Married   1 daughter   Gets regular exercise: Walking, yoga, weight lifting    Family History  Problem Relation Age of Onset  . Hypertension Mother   . Migraines Mother   . Hypertension Father   . Lung cancer Father     Review of Systems:  As stated in the HPI and otherwise negative.   BP 120/68 mmHg  Ht 5\' 9"  (1.753 m)  Wt 160 lb (72.576 kg)  BMI 23.62 kg/m2  Physical Examination: General: Well developed,  well nourished, NAD HEENT: OP clear, mucus membranes moist SKIN: warm, dry. No rashes. Neuro: No focal deficits Musculoskeletal: Muscle strength 5/5 all ext Psychiatric: Mood and affect normal Neck: No JVD, no carotid bruits, no thyromegaly, no lymphadenopathy. Lungs:Clear bilaterally, no wheezes, rhonci, crackles Cardiovascular: Regular rate and rhythm. No murmurs, gallops or rubs. Abdomen:Soft. Bowel sounds present. Non-tender.  Extremities: No lower extremity edema. Pulses are 2 + in the bilateral DP/PT.  Echo 03/09/15: Left ventricle: The cavity size was normal. Wall thickness was normal. Systolic function was normal. The estimated ejection fraction was in the range of 60% to 65%. Wall motion was normal; there were no regional wall motion abnormalities. Doppler parameters are consistent with abnormal left ventricular relaxation (grade 1 diastolic dysfunction). The E/e&' ratio is between 8-15, suggesting indeterminate LV filling pressure. - Mitral valve: Mildly thickened leaflets . There was trivial regurgitation. - Left atrium: The atrium was normal in size. - Inferior vena cava: The vessel was normal in size. The respirophasic diameter changes were in the normal range (>= 50%), consistent with normal central venous pressure.  Impressions:  - Compared to a prior echo in 2014, there is stable mild AI. LV chamber dimensions are stable. Normal LA size.   Cardiac cath 01/03/13: Angiographic Findings:  Left main: No obstructive disease.  Left Anterior Descending Artery: Large caliber vessel that courses to the apex. There is a moderate caliber diagonal branch. No obstructive disease.  Circumflex Artery: Large caliber vessel with large obtuse marginal branch. No obstructive disease.  Right Coronary Artery: Large dominant vessel with no obstructive disease.  Left Ventricular Angiogram: LVEF=65%.  Impression:  1. No angiographic evidence of CAD  2. Normal LV  systolic function  3. Non-cardiac chest pain  EKG:  EKG is ordered today. The ekg ordered today demonstrates NSR, rate 63 bpm.   Recent Labs: 06/23/2014: ALT 21; BUN 12.5; Creatinine 0.7; Potassium 4.0; Sodium 143 10/13/2014: HGB 14.3; Platelets 303   Lipid Panel No results found for: CHOL, TRIG, HDL, CHOLHDL, VLDL, LDLCALC, LDLDIRECT   Wt Readings from Last 3 Encounters:  03/11/15 160 lb (72.576 kg)  03/10/15 158 lb (71.668 kg)  10/13/14 158 lb 4.8 oz (71.804 kg)     Other studies Reviewed: Additional studies/ records that were reviewed today include: . Review of the above records demonstrates:  Assessment and Plan:   1. Chest pain: No recurrence. Cardiac cath 01/03/13 with no evidence of CAD.   2. HTN: BP is controlled. Followed in primary care.  3. Aortic insufficiency: Mild by echo October 2016. Repeat echo October 2018.   4. Pre-operative cardiovascular examination: She can proceed with planned surgical procedure (lipoma removal) without further cardiac workup.   Current medicines are reviewed at length with the patient today.  The patient does not have concerns regarding medicines.  The following changes have been made:  no change  Labs/ tests ordered today include:   Orders Placed This Encounter  Procedures  . EKG 12-Lead    Disposition:   FU with me in 12  months  Signed, Lauree Chandler, MD 03/11/2015 11:24 AM    [facility] [address], [place], [place]  [address] Phone: [phone]; Fax: [phone]

## 2015-03-17 ENCOUNTER — Encounter (HOSPITAL_BASED_OUTPATIENT_CLINIC_OR_DEPARTMENT_OTHER)
Admission: RE | Admit: 2015-03-17 | Discharge: 2015-03-17 | Disposition: A | Discharge status: Home / Self Care | Payer: BLUE CROSS/BLUE SHIELD | Source: Ambulatory Visit | Attending: Plastic Surgery | Admitting: Plastic Surgery

## 2015-03-17 DIAGNOSIS — K219 Gastro-esophageal reflux disease without esophagitis: Secondary | ICD-10-CM | POA: Diagnosis not present

## 2015-03-17 DIAGNOSIS — F329 Major depressive disorder, single episode, unspecified: Secondary | ICD-10-CM | POA: Diagnosis not present

## 2015-03-17 DIAGNOSIS — R229 Localized swelling, mass and lump, unspecified: Secondary | ICD-10-CM | POA: Diagnosis present

## 2015-03-17 DIAGNOSIS — I351 Nonrheumatic aortic (valve) insufficiency: Secondary | ICD-10-CM | POA: Diagnosis not present

## 2015-03-17 DIAGNOSIS — D1721 Benign lipomatous neoplasm of skin and subcutaneous tissue of right arm: Secondary | ICD-10-CM | POA: Diagnosis not present

## 2015-03-17 DIAGNOSIS — F419 Anxiety disorder, unspecified: Secondary | ICD-10-CM | POA: Diagnosis not present

## 2015-03-17 DIAGNOSIS — I10 Essential (primary) hypertension: Secondary | ICD-10-CM | POA: Diagnosis not present

## 2015-03-17 LAB — BASIC METABOLIC PANEL
Anion gap: 7 (ref 5–15)
BUN: 14 mg/dL (ref 6–20)
CALCIUM: 10.1 mg/dL (ref 8.9–10.3)
CHLORIDE: 101 mmol/L (ref 101–111)
CO2: 32 mmol/L (ref 22–32)
CREATININE: 0.6 mg/dL (ref 0.44–1.00)
GFR calc non Af Amer: >60 mL/min (ref >60)
GLUCOSE: 100 mg/dL — AB (ref 65–99)
Potassium: 4.5 mmol/L (ref 3.5–5.1)
Sodium: 140 mmol/L (ref 135–145)

## 2015-03-19 ENCOUNTER — Ambulatory Visit: Payer: Self-pay | Admitting: Plastic Surgery

## 2015-03-23 ENCOUNTER — Encounter (HOSPITAL_BASED_OUTPATIENT_CLINIC_OR_DEPARTMENT_OTHER): Payer: Self-pay | Admitting: Certified Registered"

## 2015-03-23 ENCOUNTER — Ambulatory Visit (HOSPITAL_BASED_OUTPATIENT_CLINIC_OR_DEPARTMENT_OTHER): Payer: BLUE CROSS/BLUE SHIELD | Admitting: Certified Registered"

## 2015-03-23 ENCOUNTER — Encounter (HOSPITAL_BASED_OUTPATIENT_CLINIC_OR_DEPARTMENT_OTHER)
Admission: RE | Disposition: A | Discharge status: Home / Self Care | Payer: Self-pay | Source: Ambulatory Visit | Attending: Plastic Surgery

## 2015-03-23 ENCOUNTER — Ambulatory Visit (HOSPITAL_BASED_OUTPATIENT_CLINIC_OR_DEPARTMENT_OTHER)
Admission: RE | Admit: 2015-03-23 | Discharge: 2015-03-23 | Disposition: A | Discharge status: Home / Self Care | Payer: BLUE CROSS/BLUE SHIELD | Source: Ambulatory Visit | Attending: Plastic Surgery | Admitting: Plastic Surgery

## 2015-03-23 DIAGNOSIS — D1721 Benign lipomatous neoplasm of skin and subcutaneous tissue of right arm: Secondary | ICD-10-CM | POA: Insufficient documentation

## 2015-03-23 DIAGNOSIS — I10 Essential (primary) hypertension: Secondary | ICD-10-CM | POA: Insufficient documentation

## 2015-03-23 DIAGNOSIS — F329 Major depressive disorder, single episode, unspecified: Secondary | ICD-10-CM | POA: Insufficient documentation

## 2015-03-23 DIAGNOSIS — K219 Gastro-esophageal reflux disease without esophagitis: Secondary | ICD-10-CM | POA: Insufficient documentation

## 2015-03-23 DIAGNOSIS — F419 Anxiety disorder, unspecified: Secondary | ICD-10-CM | POA: Insufficient documentation

## 2015-03-23 DIAGNOSIS — I351 Nonrheumatic aortic (valve) insufficiency: Secondary | ICD-10-CM | POA: Insufficient documentation

## 2015-03-23 HISTORY — DX: Anxiety disorder, unspecified: F41.9

## 2015-03-23 HISTORY — PX: LIPOMA EXCISION: SHX5283

## 2015-03-23 SURGERY — EXCISION LIPOMA
Anesthesia: General | Site: Arm Upper | Laterality: Right

## 2015-03-23 MED ORDER — CEFAZOLIN SODIUM-DEXTROSE 2-3 GM-% IV SOLR
INTRAVENOUS | Status: AC
  Filled 2015-03-23: qty 50

## 2015-03-23 MED ORDER — LIDOCAINE HCL (CARDIAC) 20 MG/ML IV SOLN
INTRAVENOUS | Status: AC
  Filled 2015-03-23: qty 5

## 2015-03-23 MED ORDER — PROPOFOL 10 MG/ML IV BOLUS
INTRAVENOUS | Status: AC
  Filled 2015-03-23: qty 20

## 2015-03-23 MED ORDER — FENTANYL CITRATE (PF) 100 MCG/2ML IJ SOLN
INTRAMUSCULAR | Status: AC
  Filled 2015-03-23: qty 4

## 2015-03-23 MED ORDER — LIDOCAINE HCL (CARDIAC) 20 MG/ML IV SOLN
INTRAVENOUS | Status: DC | PRN
  Administered 2015-03-23: 60 mg via INTRAVENOUS

## 2015-03-23 MED ORDER — EPHEDRINE SULFATE 50 MG/ML IJ SOLN
INTRAMUSCULAR | Status: AC
  Filled 2015-03-23: qty 1

## 2015-03-23 MED ORDER — BACITRACIN ZINC 500 UNIT/GM EX OINT
TOPICAL_OINTMENT | CUTANEOUS | Status: AC
  Filled 2015-03-23: qty 1.8

## 2015-03-23 MED ORDER — FENTANYL CITRATE (PF) 100 MCG/2ML IJ SOLN: 25.0000 ug | INTRAMUSCULAR | Status: DC | PRN

## 2015-03-23 MED ORDER — ONDANSETRON HCL 4 MG/2ML IJ SOLN
INTRAMUSCULAR | Status: DC | PRN
  Administered 2015-03-23: 4 mg via INTRAVENOUS

## 2015-03-23 MED ORDER — SCOPOLAMINE 1 MG/3DAYS TD PT72: 1.0000 | MEDICATED_PATCH | Freq: Once | TRANSDERMAL | Status: DC | PRN

## 2015-03-23 MED ORDER — OXYCODONE-ACETAMINOPHEN 5-325 MG PO TABS
ORAL_TABLET | ORAL | Status: AC
  Filled 2015-03-23: qty 1

## 2015-03-23 MED ORDER — DEXAMETHASONE SODIUM PHOSPHATE 4 MG/ML IJ SOLN
INTRAMUSCULAR | Status: DC | PRN
  Administered 2015-03-23: 10 mg via INTRAVENOUS

## 2015-03-23 MED ORDER — HYDROCODONE-ACETAMINOPHEN 5-325 MG PO TABS
ORAL_TABLET | ORAL | Status: AC
  Filled 2015-03-23: qty 1

## 2015-03-23 MED ORDER — CEFAZOLIN SODIUM-DEXTROSE 2-3 GM-% IV SOLR
2.0000 g | INTRAVENOUS | Status: AC
  Administered 2015-03-23: 2 g via INTRAVENOUS

## 2015-03-23 MED ORDER — PROMETHAZINE HCL 25 MG/ML IJ SOLN: 6.2500 mg | INTRAMUSCULAR | Status: DC | PRN

## 2015-03-23 MED ORDER — FENTANYL CITRATE (PF) 100 MCG/2ML IJ SOLN
50.0000 ug | INTRAMUSCULAR | Status: DC | PRN
  Administered 2015-03-23: 50 ug via INTRAVENOUS

## 2015-03-23 MED ORDER — DEXAMETHASONE SODIUM PHOSPHATE 10 MG/ML IJ SOLN
INTRAMUSCULAR | Status: AC
  Filled 2015-03-23: qty 1

## 2015-03-23 MED ORDER — LIDOCAINE-EPINEPHRINE 1 %-1:100000 IJ SOLN
INTRAMUSCULAR | Status: DC | PRN
  Administered 2015-03-23: 20 mL

## 2015-03-23 MED ORDER — GLYCOPYRROLATE 0.2 MG/ML IJ SOLN: 0.2000 mg | Freq: Once | INTRAMUSCULAR | Status: DC | PRN

## 2015-03-23 MED ORDER — MIDAZOLAM HCL 2 MG/2ML IJ SOLN: 1.0000 mg | INTRAMUSCULAR | Status: DC | PRN

## 2015-03-23 MED ORDER — TRIAMCINOLONE ACETONIDE 40 MG/ML IJ SUSP
INTRAMUSCULAR | Status: AC
  Filled 2015-03-23: qty 5

## 2015-03-23 MED ORDER — HYDROCODONE-ACETAMINOPHEN 5-325 MG PO TABS
1.0000 | ORAL_TABLET | Freq: Once | ORAL | Status: AC
  Administered 2015-03-23: 1 via ORAL

## 2015-03-23 MED ORDER — PROPOFOL 10 MG/ML IV BOLUS
INTRAVENOUS | Status: DC | PRN
  Administered 2015-03-23: 200 mg via INTRAVENOUS

## 2015-03-23 MED ORDER — SUCCINYLCHOLINE CHLORIDE 20 MG/ML IJ SOLN
INTRAMUSCULAR | Status: AC
  Filled 2015-03-23: qty 1

## 2015-03-23 MED ORDER — EPHEDRINE SULFATE 50 MG/ML IJ SOLN
INTRAMUSCULAR | Status: DC | PRN
  Administered 2015-03-23: 10 mg via INTRAVENOUS

## 2015-03-23 MED ORDER — LIDOCAINE-EPINEPHRINE 1 %-1:100000 IJ SOLN
INTRAMUSCULAR | Status: AC
  Filled 2015-03-23: qty 1

## 2015-03-23 MED ORDER — LACTATED RINGERS IV SOLN
INTRAVENOUS | Status: DC
  Administered 2015-03-23 (×2): via INTRAVENOUS

## 2015-03-23 MED ORDER — ONDANSETRON HCL 4 MG/2ML IJ SOLN
INTRAMUSCULAR | Status: AC
  Filled 2015-03-23: qty 2

## 2015-03-23 SURGICAL SUPPLY — 59 items
BANDAGE ELASTIC 6 VELCRO ST LF (GAUZE/BANDAGES/DRESSINGS) IMPLANT
BENZOIN TINCTURE PRP APPL 2/3 (GAUZE/BANDAGES/DRESSINGS) IMPLANT
BLADE HEX COATED 2.75 (ELECTRODE) ×3 IMPLANT
BLADE KNIFE PERSONA 15 (BLADE) ×3 IMPLANT
BLADE SURG 10 STRL SS (BLADE) IMPLANT
BLADE SURG 15 STRL LF DISP TIS (BLADE) ×2 IMPLANT
BLADE SURG 15 STRL SS (BLADE) ×4
BNDG COHESIVE 3X5 TAN STRL LF (GAUZE/BANDAGES/DRESSINGS) ×3 IMPLANT
BNDG CONFORM 3 STRL LF (GAUZE/BANDAGES/DRESSINGS) ×3 IMPLANT
CANISTER SUCT 1200ML W/VALVE (MISCELLANEOUS) IMPLANT
CLOSURE WOUND 1/2 X4 (GAUZE/BANDAGES/DRESSINGS) ×1
COTTONBALL LRG STERILE PKG (GAUZE/BANDAGES/DRESSINGS) IMPLANT
COVER BACK TABLE 60X90IN (DRAPES) ×3 IMPLANT
COVER MAYO STAND STRL (DRAPES) ×3 IMPLANT
DECANTER SPIKE VIAL GLASS SM (MISCELLANEOUS) ×3 IMPLANT
DRAPE LAPAROTOMY 100X72 PEDS (DRAPES) ×3 IMPLANT
DRAPE U-SHAPE 76X120 STRL (DRAPES) IMPLANT
ELECT NEEDLE TIP 2.8 STRL (NEEDLE) IMPLANT
ELECT REM PT RETURN 9FT ADLT (ELECTROSURGICAL) ×3
ELECTRODE REM PT RTRN 9FT ADLT (ELECTROSURGICAL) ×1 IMPLANT
GAUZE SPONGE 4X4 12PLY STRL (GAUZE/BANDAGES/DRESSINGS) IMPLANT
GAUZE SPONGE 4X4 16PLY XRAY LF (GAUZE/BANDAGES/DRESSINGS) IMPLANT
GLOVE BIO SURGEON STRL SZ7 (GLOVE) ×3 IMPLANT
GLOVE BIOGEL PI IND STRL 6.5 (GLOVE) ×3 IMPLANT
GLOVE BIOGEL PI INDICATOR 6.5 (GLOVE) ×6
GLOVE ECLIPSE 6.5 STRL STRAW (GLOVE) ×3 IMPLANT
GOWN STRL REUS W/ TWL LRG LVL3 (GOWN DISPOSABLE) ×2 IMPLANT
GOWN STRL REUS W/TWL LRG LVL3 (GOWN DISPOSABLE) ×4
NEEDLE HYPO 25X1 1.5 SAFETY (NEEDLE) ×3 IMPLANT
NEEDLE HYPO 30X.5 LL (NEEDLE) IMPLANT
NEEDLE SPNL 18GX3.5 QUINCKE PK (NEEDLE) IMPLANT
NS IRRIG 1000ML POUR BTL (IV SOLUTION) ×3 IMPLANT
PACK BASIN DAY SURGERY FS (CUSTOM PROCEDURE TRAY) ×3 IMPLANT
PAD ALCOHOL SWAB (MISCELLANEOUS) IMPLANT
PEN SKIN MARKING BROAD TIP (MISCELLANEOUS) ×3 IMPLANT
PENCIL BUTTON HOLSTER BLD 10FT (ELECTRODE) ×3 IMPLANT
SHEET MEDIUM DRAPE 40X70 STRL (DRAPES) ×3 IMPLANT
SLEEVE SCD COMPRESS KNEE MED (MISCELLANEOUS) ×3 IMPLANT
SPONGE GAUZE 4X4 12PLY STER LF (GAUZE/BANDAGES/DRESSINGS) ×3 IMPLANT
STRIP CLOSURE SKIN 1/2X4 (GAUZE/BANDAGES/DRESSINGS) ×2 IMPLANT
SUT MNCRL AB 3-0 PS2 18 (SUTURE) ×6 IMPLANT
SUT MNCRL AB 4-0 PS2 18 (SUTURE) ×3 IMPLANT
SUT MON AB 2-0 CT1 36 (SUTURE) IMPLANT
SUT MON AB 4-0 PC3 18 (SUTURE) IMPLANT
SUT MON AB 5-0 PS2 18 (SUTURE) IMPLANT
SUT PROLENE 5 0 PS 2 (SUTURE) IMPLANT
SUT PROLENE 6 0 P 1 18 (SUTURE) IMPLANT
SUT SILK 4 0 PS 2 (SUTURE) IMPLANT
SUT SILK 6 0 P 1 (SUTURE) IMPLANT
SUT VIC AB 5-0 P-3 18X BRD (SUTURE) IMPLANT
SUT VIC AB 5-0 P3 18 (SUTURE)
SYR BULB IRRIGATION 50ML (SYRINGE) ×3 IMPLANT
SYR CONTROL 10ML LL (SYRINGE) ×3 IMPLANT
TOWEL OR 17X24 6PK STRL BLUE (TOWEL DISPOSABLE) ×6 IMPLANT
TRAY DSU PREP LF (CUSTOM PROCEDURE TRAY) ×3 IMPLANT
TUBE CONNECTING 20'X1/4 (TUBING) ×1
TUBE CONNECTING 20X1/4 (TUBING) ×2 IMPLANT
UNDERPAD 30X30 (UNDERPADS AND DIAPERS) ×3 IMPLANT
YANKAUER SUCT BULB TIP NO VENT (SUCTIONS) ×3 IMPLANT

## 2015-03-23 NOTE — Discharge Instructions (Signed)
1. No lifting greater than 5 lbs with right arms for 2 weeks. 2. Percocet 5/325 mg tabs 1-2 tabs or Tramadol po q 4-6 hours prn pain- prescription given in office. 3. Keflex 500mg  tab take 1 tab po four times a day- prescription given in office. 4. Follow-up appointment Friday in office.    Post Anesthesia Home Care Instructions  Activity: Get plenty of rest for the remainder of the day. A responsible adult should stay with you for 24 hours following the procedure.  For the next 24 hours, DO NOT: -Drive a car -Paediatric nurse -Drink alcoholic beverages -Take any medication unless instructed by your physician -Make any legal decisions or sign important papers.  Meals: Start with liquid foods such as gelatin or soup. Progress to regular foods as tolerated. Avoid greasy, spicy, heavy foods. If nausea and/or vomiting occur, drink only clear liquids until the nausea and/or vomiting subsides. Call your physician if vomiting continues.  Special Instructions/Symptoms: Your throat may feel dry or sore from the anesthesia or the breathing tube placed in your throat during surgery. If this causes discomfort, gargle with warm salt water. The discomfort should disappear within 24 hours.  If you had a scopolamine patch placed behind your ear for the management of post- operative nausea and/or vomiting:  1. The medication in the patch is effective for 72 hours, after which it should be removed.  Wrap patch in a tissue and discard in the trash. Wash hands thoroughly with soap and water. 2. You may remove the patch earlier than 72 hours if you experience unpleasant side effects which may include dry mouth, dizziness or visual disturbances. 3. Avoid touching the patch. Wash your hands with soap and water after contact with the patch.

## 2015-03-23 NOTE — Brief Op Note (Signed)
03/23/2015  11:58 AM  PATIENT:  Kristine Jones  64 y.o. female  PRE-OPERATIVE DIAGNOSIS:  SOFT TISSUE MASS RIGHT UPPER ARM LARGE  POST-OPERATIVE DIAGNOSIS:  LIPOMA RIGHT UPPER ARM LARGE  PROCEDURE:  Procedure(s): EXCISION LARGE LIPOMA RIGHT UPPER ARM   (Right)   SURGEON:  Surgeon(s) and Role:    * Youlanda Roys, MD - Primary  ANESTHESIA:   general  EBL:  Total I/O In: 1320 [P.O.:120; I.V.:1200] Out: -   BLOOD ADMINISTERED:none  DRAINS: none   LOCAL MEDICATIONS USED:  LIDOCAINE   SPECIMEN:  Source of Specimen:  Right upper arm  DISPOSITION OF SPECIMEN:  PATHOLOGY  COUNTS:  YES  DICTATION: .Other Dictation: Dictation Number [id number]  PLAN OF CARE: Discharge to home after PACU  PATIENT DISPOSITION:  PACU - hemodynamically stable.   Delay start of Pharmacological VTE agent (>24hrs) due to surgical blood loss or risk of bleeding: not applicable

## 2015-03-23 NOTE — Anesthesia Preprocedure Evaluation (Addendum)
Anesthesia Evaluation  Patient identified by MRN, date of birth, ID band Patient awake    Reviewed: Allergy & Precautions, NPO status , Patient's Chart, lab work & pertinent test results  Airway Mallampati: III  TM Distance: >3 FB Neck ROM: Full    Dental  (+) Teeth Intact, Dental Advisory Given   Pulmonary neg pulmonary ROS,    Pulmonary exam normal breath sounds clear to auscultation       Cardiovascular hypertension, Pt. on medications Normal cardiovascular exam+ Valvular Problems/Murmurs AI  Rhythm:Regular Rate:Normal  03/10/15 TTE: Study Conclusions  - Left ventricle: The cavity size was normal. Wall thickness wasnormal. Systolic function was normal. The estimated ejectionfraction was in the range of 60% to 65%. Wall motion was normal;there were no regional wall motion abnormalities. Dopplerparameters are consistent with abnormal left ventricularrelaxation (grade 1 diastolic dysfunction). The E/e&' ratio isbetween 8-15, suggesting indeterminate LV filling pressure. - Mitral valve: Mildly thickened leaflets . There was trivial regurgitation. - Left atrium: The atrium was normal in size. - Inferior vena cava: The vessel was normal in size. The respirophasic diameter changes were in the normal range (>= 50%),consistent with normal central venous pressure. Impressions: - Compared to a prior echo in 2014, there is stable mild AI. LVchamber dimensions are stable. Normal LA size.   Neuro/Psych  Headaches, PSYCHIATRIC DISORDERS Anxiety Depression    GI/Hepatic Neg liver ROS, GERD  Medicated,  Endo/Other  negative endocrine ROS  Renal/GU negative Renal ROS     Musculoskeletal negative musculoskeletal ROS (+)   Abdominal   Peds  Hematology negative hematology ROS (+)   Anesthesia Other Findings Day of surgery medications reviewed with the patient.  Reproductive/Obstetrics                           Anesthesia Physical Anesthesia Plan  ASA: III  Anesthesia Plan: General   Post-op Pain Management:    Induction: Intravenous  Airway Management Planned: LMA  Additional Equipment:   Intra-op Plan:   Post-operative Plan: Extubation in OR  Informed Consent: I have reviewed the patients History and Physical, chart, labs and discussed the procedure including the risks, benefits and alternatives for the proposed anesthesia with the patient or authorized representative who has indicated his/her understanding and acceptance.   Dental advisory given  Plan Discussed with: CRNA  Anesthesia Plan Comments: (Risks/benefits of general anesthesia discussed with patient including risk of damage to teeth, lips, gum, and tongue, nausea/vomiting, allergic reactions to medications, and the possibility of heart attack, stroke and death.  All patient questions answered.  Patient wishes to proceed.)        Anesthesia Quick Evaluation

## 2015-03-23 NOTE — Anesthesia Procedure Notes (Signed)
Procedure Name: LMA Insertion Date/Time: 03/23/2015 10:12 AM Performed by: Majesta Leichter D Pre-anesthesia Checklist: Patient identified, Emergency Drugs available, Suction available and Patient being monitored Patient Re-evaluated:Patient Re-evaluated prior to inductionOxygen Delivery Method: Circle System Utilized Preoxygenation: Pre-oxygenation with 100% oxygen Intubation Type: IV induction Ventilation: Mask ventilation without difficulty LMA: LMA inserted LMA Size: 4.0 Number of attempts: 1 Airway Equipment and Method: Bite block Placement Confirmation: positive ETCO2 Tube secured with: Tape Dental Injury: Teeth and Oropharynx as per pre-operative assessment

## 2015-03-23 NOTE — Anesthesia Postprocedure Evaluation (Signed)
  Anesthesia Post-op Note  Patient: Kristine Jones  Procedure(s) Performed: Procedure(s): EXCISION LARGE LIPOMA RIGHT UPPER ARM   (Right)  Patient Location: PACU  Anesthesia Type:General  Level of Consciousness: awake and alert   Airway and Oxygen Therapy: Patient Spontanous Breathing  Post-op Pain: none  Post-op Assessment: Post-op Vital signs reviewed              Post-op Vital Signs: Reviewed  Last Vitals:  Filed Vitals:   03/23/15 1200  BP: 131/72  Pulse: 76  Temp:   Resp: 18    Complications: No apparent anesthesia complications

## 2015-03-23 NOTE — Transfer of Care (Signed)
Immediate Anesthesia Transfer of Care Note  Patient: Kristine Jones  Procedure(s) Performed: Procedure(s): EXCISION LARGE LIPOMA RIGHT UPPER ARM   (Right)  Patient Location: PACU  Anesthesia Type:General  Level of Consciousness: awake, alert , oriented and patient cooperative  Airway & Oxygen Therapy: Patient Spontanous Breathing and Patient connected to face mask oxygen  Post-op Assessment: Report given to RN and Post -op Vital signs reviewed and stable  Post vital signs: Reviewed and stable  Last Vitals:  Filed Vitals:   03/23/15 0918  BP: 171/77  Pulse: 85  Temp: 36.6 C  Resp: 20    Complications: No apparent anesthesia complications

## 2015-03-24 ENCOUNTER — Encounter (HOSPITAL_BASED_OUTPATIENT_CLINIC_OR_DEPARTMENT_OTHER): Payer: Self-pay | Admitting: Plastic Surgery

## 2015-03-24 NOTE — Op Note (Signed)
Kristine Jones, Kristine Jones            ACCOUNT NO.:  [id number]  MEDICAL RECORD NO.:  [id number]  LOCATION:                               FACILITY:  [facility]  PHYSICIAN:  Hetty Blend, M.D.DATE OF BIRTH:  December 24, 1950  DATE OF PROCEDURE:  03/23/2015 DATE OF DISCHARGE:  03/23/2015                              OPERATIVE REPORT   PREOPERATIVE DIAGNOSIS:  Right upper arm soft tissue mass.  POSTOPERATIVE DIAGNOSIS:  Right upper arm lipoma.  PROCEDURE: 1. Complex closure 5.2 cm, right upper arm incision. 2. Wide local excision 12.1 cm, right upper arm lipoma.  SURGEON:  Hetty Blend, M.D.  ANESTHESIA:  General.  COMPLICATIONS:  None.  INDICATIONS FOR THE PROCEDURE:  The patient is a 64 year old, Caucasian female, who has a soft tissue mass in the right upper arm that has been growing for many years.  A preoperative MRI indicates it more than likely is a lipoma.  The soft tissue mass does not appear to involve the muscles or any neurovascular structures.  She now presents to undergo wide local excision of this soft tissue mass.  DESCRIPTION OF PROCEDURE:  The patient was marked in the preop holding area for the future soft tissue mass excision on the right upper arm. She was then taken back to the OR and placed on the table in a supine position.  After adequate general anesthesia was obtained, the patient's arm was prepped with Betadine and draped in a sterile fashion.  The skin and soft tissue in the area of the soft tissue mass of the right upper arm were then infiltrated with 1% lidocaine with epinephrine.  After adequate hemostasis and anesthesia taken effect, the procedure was begun.  The soft tissue mass was excised in the following manner.  An incision was made over the central aspect of the soft tissue mass to lie within the normal skin tension lines of the upper arm skin.  The incision was made through the skin with a knife and then carried down into the subcutaneous  tissues.  After dissecting down past the superficial fatty tissue layer of the soft tissues, it was apparent that there was a lipoma present beneath the normal subcutaneous fatty tissue layer.  This lipoma was lying on top of the upper arm muscles.  The lipoma was shelled out almost intact.  It did appear to be mostly encapsulated.  I then looked throughout the fatty tissue of the upper arm surrounding the lipoma excision and removed any other tissue that appeared to have been related to the lipoma.  There were few areas that were not encapsulated and I removed as much of the lipoma as was apparent.  Meticulous hemostasis was obtained.  The wound was irrigated with saline irrigation.  The wound was then closed in a complex fashion.  The deeper subcutaneous tissues were closed with 3-0 Monocryl suture.  The deep dermal layer was closed with 3-0 Monocryl suture.  The superficial dermal layer was then closed with 3-0 Monocryl suture.  The skin was closed with 4-0 Monocryl in a running intracuticular stitch.  The incision was dressed with benzoin and Steri-Strips followed by a light pressure dressing of Kling gauze and Coban for a dressing.  There were no complications.  The patient tolerated the procedure well.  The final needle and sponge counts were reported to be correct at the end of the case.  The patient was then awakened from general anesthesia, taken to the recovery room in a stable condition.  She was also recovered without complications.  Both the patient and her husband were given proper postoperative wound care instructions.  She was then discharged home in the care of her husband in stable condition.  Followup appointment will be within a few days in the office.          ______________________________ Hetty Blend, M.D.     MC/MEDQ  D:  03/23/2015  T:  03/24/2015  Job:  [id number]  cc:   Hetty Blend, M.D.

## 2015-06-08 DIAGNOSIS — E7800 Pure hypercholesterolemia, unspecified: Secondary | ICD-10-CM | POA: Diagnosis not present

## 2015-06-08 DIAGNOSIS — I10 Essential (primary) hypertension: Secondary | ICD-10-CM | POA: Diagnosis not present

## 2015-06-15 DIAGNOSIS — Z0000 Encounter for general adult medical examination without abnormal findings: Secondary | ICD-10-CM | POA: Diagnosis not present

## 2015-08-25 DIAGNOSIS — Z1231 Encounter for screening mammogram for malignant neoplasm of breast: Secondary | ICD-10-CM | POA: Diagnosis not present

## 2015-08-25 DIAGNOSIS — Z803 Family history of malignant neoplasm of breast: Secondary | ICD-10-CM | POA: Diagnosis not present

## 2015-09-21 DIAGNOSIS — J069 Acute upper respiratory infection, unspecified: Secondary | ICD-10-CM | POA: Diagnosis not present

## 2015-10-06 DIAGNOSIS — H43813 Vitreous degeneration, bilateral: Secondary | ICD-10-CM | POA: Diagnosis not present

## 2015-10-06 DIAGNOSIS — D3130 Benign neoplasm of unspecified choroid: Secondary | ICD-10-CM | POA: Diagnosis not present

## 2015-10-25 DIAGNOSIS — H33103 Unspecified retinoschisis, bilateral: Secondary | ICD-10-CM | POA: Diagnosis not present

## 2015-10-25 DIAGNOSIS — D3131 Benign neoplasm of right choroid: Secondary | ICD-10-CM | POA: Diagnosis not present

## 2015-10-25 DIAGNOSIS — H43811 Vitreous degeneration, right eye: Secondary | ICD-10-CM | POA: Diagnosis not present

## 2015-11-18 DIAGNOSIS — I10 Essential (primary) hypertension: Secondary | ICD-10-CM | POA: Diagnosis not present

## 2015-12-01 DIAGNOSIS — L821 Other seborrheic keratosis: Secondary | ICD-10-CM | POA: Diagnosis not present

## 2015-12-01 DIAGNOSIS — D2271 Melanocytic nevi of right lower limb, including hip: Secondary | ICD-10-CM | POA: Diagnosis not present

## 2015-12-01 DIAGNOSIS — L570 Actinic keratosis: Secondary | ICD-10-CM | POA: Diagnosis not present

## 2015-12-01 DIAGNOSIS — D2262 Melanocytic nevi of left upper limb, including shoulder: Secondary | ICD-10-CM | POA: Diagnosis not present

## 2015-12-01 DIAGNOSIS — D220 Melanocytic nevi of lip: Secondary | ICD-10-CM | POA: Diagnosis not present

## 2016-01-07 DIAGNOSIS — M545 Low back pain: Secondary | ICD-10-CM | POA: Diagnosis not present

## 2016-01-07 DIAGNOSIS — M810 Age-related osteoporosis without current pathological fracture: Secondary | ICD-10-CM | POA: Diagnosis not present

## 2016-01-07 DIAGNOSIS — T148 Other injury of unspecified body region: Secondary | ICD-10-CM | POA: Diagnosis not present

## 2016-01-10 DIAGNOSIS — M810 Age-related osteoporosis without current pathological fracture: Secondary | ICD-10-CM | POA: Diagnosis not present

## 2016-03-09 DIAGNOSIS — M8580 Other specified disorders of bone density and structure, unspecified site: Secondary | ICD-10-CM | POA: Diagnosis not present

## 2016-03-22 DIAGNOSIS — Z23 Encounter for immunization: Secondary | ICD-10-CM | POA: Diagnosis not present

## 2016-04-19 DIAGNOSIS — Z01419 Encounter for gynecological examination (general) (routine) without abnormal findings: Secondary | ICD-10-CM | POA: Diagnosis not present

## 2016-05-26 DIAGNOSIS — H5213 Myopia, bilateral: Secondary | ICD-10-CM | POA: Diagnosis not present

## 2016-05-26 DIAGNOSIS — D3131 Benign neoplasm of right choroid: Secondary | ICD-10-CM | POA: Diagnosis not present

## 2016-06-14 DIAGNOSIS — I10 Essential (primary) hypertension: Secondary | ICD-10-CM | POA: Diagnosis not present

## 2016-06-14 DIAGNOSIS — E7800 Pure hypercholesterolemia, unspecified: Secondary | ICD-10-CM | POA: Diagnosis not present

## 2016-06-14 DIAGNOSIS — Z0000 Encounter for general adult medical examination without abnormal findings: Secondary | ICD-10-CM | POA: Diagnosis not present

## 2016-06-21 DIAGNOSIS — Z85820 Personal history of malignant melanoma of skin: Secondary | ICD-10-CM | POA: Diagnosis not present

## 2016-06-21 DIAGNOSIS — I351 Nonrheumatic aortic (valve) insufficiency: Secondary | ICD-10-CM | POA: Diagnosis not present

## 2016-06-21 DIAGNOSIS — Z23 Encounter for immunization: Secondary | ICD-10-CM | POA: Diagnosis not present

## 2016-06-21 DIAGNOSIS — Z0001 Encounter for general adult medical examination with abnormal findings: Secondary | ICD-10-CM | POA: Diagnosis not present

## 2016-06-21 DIAGNOSIS — I10 Essential (primary) hypertension: Secondary | ICD-10-CM | POA: Diagnosis not present

## 2016-07-06 ENCOUNTER — Encounter: Payer: Self-pay | Admitting: Cardiovascular Disease

## 2016-07-17 ENCOUNTER — Ambulatory Visit (INDEPENDENT_AMBULATORY_CARE_PROVIDER_SITE_OTHER): Payer: Medicare Other | Admitting: Cardiovascular Disease

## 2016-07-17 ENCOUNTER — Encounter: Payer: Self-pay | Admitting: Cardiovascular Disease

## 2016-07-17 VITALS — BP 150/90 | HR 66 | Ht 69.0 in | Wt 161.8 lb

## 2016-07-17 DIAGNOSIS — I10 Essential (primary) hypertension: Secondary | ICD-10-CM

## 2016-07-17 DIAGNOSIS — I351 Nonrheumatic aortic (valve) insufficiency: Secondary | ICD-10-CM

## 2016-07-17 NOTE — Progress Notes (Signed)
Chief Complaint  Patient presents with  . Essential Hypertension    follow up    History of Present Illness: 66 y.o. female with history of aortic insufficiency with possible bicuspid aortic valve, HTN, HLD who is here today for cardiac follow up. Patient was previously followed by Dr. Glade Lloyd. She established with me in 2011. She has known mild AI. She was seen in our office 12/06/12 by Richardson Dopp, PA-C and noted syncopal events with bradycardia. No exertional discomfort but she did describe resting left sided chest discomfort. Stress echo on 12/20/12 with upsloping ST depression with exercise and apical hypokinesis, worrisome for ischemia. I arranged a cardiac cath on 01/03/13 which showed no evidence of coronary artery disease. Echo 03/09/15 with normal LV systolic function, mild AI.   She is here today for follow up.  She is feeling well. No chest pain or SOB. No LE edema.    Primary Care Physician: Horatio Pel, MD   Past Medical History:  Diagnosis Date  . Abnormal stress test    a. 09/2008, ST depression on ETT @ [facility], no further w/u.  . Adenomatous colon polyp   . Anxiety   . Anxiety and depression   . Aortic insufficiency    a. 06/2012 Echo: EF 55-60%, Gr 1 DD, Mild AI, Triv MR, mildly dil LA, nl RV fxn.  Marland Kitchen History of melanoma    Followed with Dr. Tonia Brooms  . HTN (hypertension)   . Hyperlipidemia   . Leukocytosis    Mild  . Migraines   . Osteopenia   . Varicose veins     Past Surgical History:  Procedure Laterality Date  . BTL    . CHOLECYSTECTOMY    . DILATION AND CURETTAGE OF UTERUS    . FEMORAL HERNIA REPAIR    . High endocervical polyp removed    . LIPOMA EXCISION Right 03/23/2015   Procedure: EXCISION LARGE LIPOMA RIGHT UPPER ARM  ;  Surgeon: Youlanda Roys, MD;  Location: [facility];  Service: Plastics;  Laterality: Right;  . MELANOMA EXCISION  2003   right calf  . OVARIAN CYST REMOVAL      Current Outpatient  Prescriptions  Medication Sig Dispense Refill  . amLODipine (NORVASC) 2.5 MG tablet Take 2.5 mg by mouth daily.    Marland Kitchen aspirin 81 MG EC tablet Take 81 mg by mouth daily.      . Calcium Carbonate-Vitamin D (CALCIUM-VITAMIN D3) 600-200 MG-UNIT TABS Take 2 tablets by mouth daily.     . Cholecalciferol (VITAMIN D) 1000 UNITS capsule Take 1,000 Units by mouth daily.     . fish oil-omega-3 fatty acids 1000 MG capsule Take 1 g by mouth daily.      . Glucosamine-Chondroitin (GLUCOSAMINE CHONDR COMPLEX PO) Take 1 Dose by mouth daily.    Marland Kitchen LIPITOR 10 MG tablet Take 5 mg by mouth every other day. TAKE ONE HALF TABLET EVERY OTHER DAILY    . multivitamin (THERAGRAN) per tablet Take 1 tablet by mouth daily.      . sertraline (ZOLOFT) 25 MG tablet Take 25 mg by mouth daily.     No current facility-administered medications for this visit.     Allergies  Allergen Reactions  . Indocin [Indomethacin] Other (See Comments)    Stomach upset  . Ibuprofen Other (See Comments)    Grogginess  . Naproxen Other (See Comments)    CAUSE [facility] OR HAZZY  . Penicillins Other (See Comments)    "  flu like symptoms"  . Triamterene Other (See Comments)    False gout    Social History   Social History  . Marital status: Married    Spouse name: N/A  . Number of children: N/A  . Years of education: N/A   Occupational History  . Microbiologist   Social History Main Topics  . Smoking status: Never Smoker  . Smokeless tobacco: Never Used  . Alcohol use 0.5 oz/week    1 Standard drinks or equivalent per week     Comment: 1 glass of wine per week  . Drug use: No  . Sexual activity: Not on file   Other Topics Concern  . Not on file   Social History Narrative   Married   1 daughter   Gets regular exercise: Walking, yoga, weight lifting    Family History  Problem Relation Age of Onset  . Hypertension Mother   . Migraines Mother   . Hypertension Father   . Lung cancer  Father     Review of Systems:  As stated in the HPI and otherwise negative.   BP (!) 150/90   Pulse 66   Ht 5\' 9"  (1.753 m)   Wt 161 lb 12.8 oz (73.4 kg)   BMI 23.89 kg/m   Physical Examination: General: Well developed, well nourished, NAD  HEENT: OP clear, mucus membranes moist  SKIN: warm, dry. No rashes. Neuro: No focal deficits  Musculoskeletal: Muscle strength 5/5 all ext  Psychiatric: Mood and affect normal  Neck: No JVD, no carotid bruits, no thyromegaly, no lymphadenopathy.  Lungs:Clear bilaterally, no wheezes, rhonci, crackles Cardiovascular: Regular rate and rhythm. Subtle murmur. No gallops or rubs. Abdomen:Soft. Bowel sounds present. Non-tender.  Extremities: No lower extremity edema. Pulses are 2 + in the bilateral DP/PT.  Echo 03/09/15: Left ventricle: The cavity size was normal. Wall thickness was normal. Systolic function was normal. The estimated ejection fraction was in the range of 60% to 65%. Wall motion was normal; there were no regional wall motion abnormalities. Doppler parameters are consistent with abnormal left ventricular relaxation (grade 1 diastolic dysfunction). The E/e&' ratio is between 8-15, suggesting indeterminate LV filling pressure. - Mitral valve: Mildly thickened leaflets . There was trivial regurgitation. - Left atrium: The atrium was normal in size. - Inferior vena cava: The vessel was normal in size. The respirophasic diameter changes were in the normal range (>= 50%), consistent with normal central venous pressure.  Impressions:  - Compared to a prior echo in 2014, there is stable mild AI. LV chamber dimensions are stable. Normal LA size.   Cardiac cath 01/03/13: Angiographic Findings:  Left main: No obstructive disease.  Left Anterior Descending Artery: Large caliber vessel that courses to the apex. There is a moderate caliber diagonal branch. No obstructive disease.  Circumflex Artery: Large caliber  vessel with large obtuse marginal branch. No obstructive disease.  Right Coronary Artery: Large dominant vessel with no obstructive disease.  Left Ventricular Angiogram: LVEF=65%.  Impression:  1. No angiographic evidence of CAD  2. Normal LV systolic function  3. Non-cardiac chest pain  EKG:  EKG is ordered today. The ekg ordered today demonstrates NSR, rate 66 bpm.   Recent Labs: No results found for requested labs within last 8760 hours.   Lipid Panel No results found for: CHOL, TRIG, HDL, CHOLHDL, VLDL, LDLCALC, LDLDIRECT   Wt Readings from Last 3 Encounters:  07/17/16 161 lb 12.8 oz (73.4 kg)  03/23/15 157  lb (71.2 kg)  03/11/15 160 lb (72.6 kg)     Other studies Reviewed: Additional studies/ records that were reviewed today include: . Review of the above records demonstrates:    Assessment and Plan:   1. Chest pain: No recurrence. Cardiac cath 01/03/13 with no evidence of CAD.   2. HTN: BP is controlled at home. Followed in primary care.  3. Aortic insufficiency: Mild by echo October 2016. Repeat echo October 2018.   Current medicines are reviewed at length with the patient today.  The patient does not have concerns regarding medicines.  The following changes have been made:  no change  Labs/ tests ordered today include:   Orders Placed This Encounter  Procedures  . ECHOCARDIOGRAM COMPLETE    Disposition:   FU with me in 12  months  Signed, Lauree Chandler, MD 07/17/2016 11:22 AM    [facility] [place], [place], [place]  [address] Phone: [phone]; Fax: [phone]

## 2016-07-17 NOTE — Patient Instructions (Signed)
Medication Instructions:  Your physician recommends that you continue on your current medications as directed. Please refer to the Current Medication list given to you today.   Labwork: none  Testing/Procedures: Your physician has requested that you have an echocardiogram. Echocardiography is a painless test that uses sound waves to create images of your heart. It provides your doctor with information about the size and shape of your heart and how well your heart's chambers and valves are working. This procedure takes approximately one hour. There are no restrictions for this procedure. To be done in October 2018  Follow-Up: Your physician recommends that you schedule a follow-up appointment in: 12 months. Please call our office in about 9 months to schedule this appointment.     Any Other Special Instructions Will Be Listed Below (If Applicable).     If you need a refill on your cardiac medications before your next appointment, please call your pharmacy.

## 2016-07-18 ENCOUNTER — Other Ambulatory Visit: Payer: Self-pay

## 2016-07-18 DIAGNOSIS — I10 Essential (primary) hypertension: Secondary | ICD-10-CM

## 2016-08-16 DIAGNOSIS — R430 Anosmia: Secondary | ICD-10-CM | POA: Diagnosis not present

## 2016-08-16 DIAGNOSIS — R0981 Nasal congestion: Secondary | ICD-10-CM | POA: Diagnosis not present

## 2016-08-16 DIAGNOSIS — H6121 Impacted cerumen, right ear: Secondary | ICD-10-CM | POA: Diagnosis not present

## 2016-08-16 DIAGNOSIS — R0683 Snoring: Secondary | ICD-10-CM | POA: Diagnosis not present

## 2016-08-16 DIAGNOSIS — H93293 Other abnormal auditory perceptions, bilateral: Secondary | ICD-10-CM | POA: Diagnosis not present

## 2016-09-19 DIAGNOSIS — Z1231 Encounter for screening mammogram for malignant neoplasm of breast: Secondary | ICD-10-CM | POA: Diagnosis not present

## 2016-09-19 DIAGNOSIS — Z803 Family history of malignant neoplasm of breast: Secondary | ICD-10-CM | POA: Diagnosis not present

## 2016-10-03 DIAGNOSIS — L821 Other seborrheic keratosis: Secondary | ICD-10-CM | POA: Diagnosis not present

## 2016-10-03 DIAGNOSIS — L299 Pruritus, unspecified: Secondary | ICD-10-CM | POA: Diagnosis not present

## 2016-10-03 DIAGNOSIS — D2262 Melanocytic nevi of left upper limb, including shoulder: Secondary | ICD-10-CM | POA: Diagnosis not present

## 2016-10-03 DIAGNOSIS — L820 Inflamed seborrheic keratosis: Secondary | ICD-10-CM | POA: Diagnosis not present

## 2016-10-03 DIAGNOSIS — D225 Melanocytic nevi of trunk: Secondary | ICD-10-CM | POA: Diagnosis not present

## 2016-10-19 DIAGNOSIS — R430 Anosmia: Secondary | ICD-10-CM | POA: Diagnosis not present

## 2016-10-19 DIAGNOSIS — H6983 Other specified disorders of Eustachian tube, bilateral: Secondary | ICD-10-CM | POA: Diagnosis not present

## 2016-12-19 DIAGNOSIS — L239 Allergic contact dermatitis, unspecified cause: Secondary | ICD-10-CM | POA: Diagnosis not present

## 2017-02-20 DIAGNOSIS — D1801 Hemangioma of skin and subcutaneous tissue: Secondary | ICD-10-CM | POA: Diagnosis not present

## 2017-02-20 DIAGNOSIS — D2271 Melanocytic nevi of right lower limb, including hip: Secondary | ICD-10-CM | POA: Diagnosis not present

## 2017-02-20 DIAGNOSIS — D2272 Melanocytic nevi of left lower limb, including hip: Secondary | ICD-10-CM | POA: Diagnosis not present

## 2017-02-20 DIAGNOSIS — L821 Other seborrheic keratosis: Secondary | ICD-10-CM | POA: Diagnosis not present

## 2017-02-20 DIAGNOSIS — D2221 Melanocytic nevi of right ear and external auricular canal: Secondary | ICD-10-CM | POA: Diagnosis not present

## 2017-02-27 DIAGNOSIS — M25572 Pain in left ankle and joints of left foot: Secondary | ICD-10-CM | POA: Diagnosis not present

## 2017-02-27 DIAGNOSIS — M25872 Other specified joint disorders, left ankle and foot: Secondary | ICD-10-CM | POA: Diagnosis not present

## 2017-02-27 DIAGNOSIS — M1180 Other specified crystal arthropathies, unspecified site: Secondary | ICD-10-CM | POA: Diagnosis not present

## 2017-02-27 DIAGNOSIS — I10 Essential (primary) hypertension: Secondary | ICD-10-CM | POA: Diagnosis not present

## 2017-03-08 DIAGNOSIS — M8589 Other specified disorders of bone density and structure, multiple sites: Secondary | ICD-10-CM | POA: Diagnosis not present

## 2017-03-13 ENCOUNTER — Ambulatory Visit (HOSPITAL_COMMUNITY): Payer: Medicare Other | Attending: Cardiology

## 2017-03-13 ENCOUNTER — Other Ambulatory Visit: Payer: Self-pay

## 2017-03-13 DIAGNOSIS — I420 Dilated cardiomyopathy: Secondary | ICD-10-CM | POA: Insufficient documentation

## 2017-03-13 DIAGNOSIS — I080 Rheumatic disorders of both mitral and aortic valves: Secondary | ICD-10-CM | POA: Insufficient documentation

## 2017-03-13 DIAGNOSIS — I351 Nonrheumatic aortic (valve) insufficiency: Secondary | ICD-10-CM

## 2017-03-13 DIAGNOSIS — I5030 Unspecified diastolic (congestive) heart failure: Secondary | ICD-10-CM | POA: Diagnosis not present

## 2017-03-21 DIAGNOSIS — M25512 Pain in left shoulder: Secondary | ICD-10-CM | POA: Diagnosis not present

## 2017-03-30 DIAGNOSIS — Z23 Encounter for immunization: Secondary | ICD-10-CM | POA: Diagnosis not present

## 2017-05-28 DIAGNOSIS — H35013 Changes in retinal vascular appearance, bilateral: Secondary | ICD-10-CM | POA: Diagnosis not present

## 2017-05-28 DIAGNOSIS — H5213 Myopia, bilateral: Secondary | ICD-10-CM | POA: Diagnosis not present

## 2017-05-28 DIAGNOSIS — H35362 Drusen (degenerative) of macula, left eye: Secondary | ICD-10-CM | POA: Diagnosis not present

## 2017-05-28 DIAGNOSIS — D3131 Benign neoplasm of right choroid: Secondary | ICD-10-CM | POA: Diagnosis not present

## 2017-06-04 DIAGNOSIS — Z01419 Encounter for gynecological examination (general) (routine) without abnormal findings: Secondary | ICD-10-CM | POA: Diagnosis not present

## 2017-06-21 DIAGNOSIS — M8580 Other specified disorders of bone density and structure, unspecified site: Secondary | ICD-10-CM | POA: Diagnosis not present

## 2017-06-21 DIAGNOSIS — E559 Vitamin D deficiency, unspecified: Secondary | ICD-10-CM | POA: Diagnosis not present

## 2017-06-21 DIAGNOSIS — I10 Essential (primary) hypertension: Secondary | ICD-10-CM | POA: Diagnosis not present

## 2017-06-21 DIAGNOSIS — E7800 Pure hypercholesterolemia, unspecified: Secondary | ICD-10-CM | POA: Diagnosis not present

## 2017-06-26 DIAGNOSIS — E7800 Pure hypercholesterolemia, unspecified: Secondary | ICD-10-CM | POA: Diagnosis not present

## 2017-06-26 DIAGNOSIS — Z0001 Encounter for general adult medical examination with abnormal findings: Secondary | ICD-10-CM | POA: Diagnosis not present

## 2017-06-26 DIAGNOSIS — I10 Essential (primary) hypertension: Secondary | ICD-10-CM | POA: Diagnosis not present

## 2017-06-26 DIAGNOSIS — I351 Nonrheumatic aortic (valve) insufficiency: Secondary | ICD-10-CM | POA: Diagnosis not present

## 2017-06-26 DIAGNOSIS — M109 Gout, unspecified: Secondary | ICD-10-CM | POA: Diagnosis not present

## 2017-07-04 DIAGNOSIS — H35362 Drusen (degenerative) of macula, left eye: Secondary | ICD-10-CM | POA: Diagnosis not present

## 2017-07-04 DIAGNOSIS — H43811 Vitreous degeneration, right eye: Secondary | ICD-10-CM | POA: Diagnosis not present

## 2017-07-04 DIAGNOSIS — H33193 Other retinoschisis and retinal cysts, bilateral: Secondary | ICD-10-CM | POA: Diagnosis not present

## 2017-07-04 DIAGNOSIS — D3131 Benign neoplasm of right choroid: Secondary | ICD-10-CM | POA: Diagnosis not present

## 2017-07-25 ENCOUNTER — Ambulatory Visit: Payer: Medicare Other | Admitting: Cardiovascular Disease

## 2017-07-25 ENCOUNTER — Encounter: Payer: Self-pay | Admitting: Cardiovascular Disease

## 2017-07-25 VITALS — BP 158/72 | HR 76 | Ht 69.0 in | Wt 160.2 lb

## 2017-07-25 DIAGNOSIS — I351 Nonrheumatic aortic (valve) insufficiency: Secondary | ICD-10-CM

## 2017-07-25 DIAGNOSIS — I712 Thoracic aortic aneurysm, without rupture, unspecified: Secondary | ICD-10-CM

## 2017-07-25 LAB — BASIC METABOLIC PANEL
BUN/Creatinine Ratio: 17 (ref 12–28)
BUN: 13 mg/dL (ref 8–27)
CO2: 28 mmol/L (ref 20–29)
Calcium: 10.1 mg/dL (ref 8.7–10.3)
Chloride: 101 mmol/L (ref 96–106)
Creatinine, Ser: 0.75 mg/dL (ref 0.57–1.00)
GFR, EST AFRICAN AMERICAN: 95 mL/min/{1.73_m2} (ref >59)
GFR, EST NON AFRICAN AMERICAN: 83 mL/min/{1.73_m2} (ref >59)
Glucose: 78 mg/dL (ref 65–99)
POTASSIUM: 4.3 mmol/L (ref 3.5–5.2)
SODIUM: 142 mmol/L (ref 134–144)

## 2017-07-25 NOTE — Patient Instructions (Signed)
Medication Instructions:  Your physician recommends that you continue on your current medications as directed. Please refer to the Current Medication list given to you today.   Labwork: Lab work to be done today--BMP  Testing/Procedures: Your physician has requested that you have a chest MRA. To be done at [facility]--[address]  Your physician has requested that you have an echocardiogram. Echocardiography is a painless test that uses sound waves to create images of your heart. It provides your doctor with information about the size and shape of your heart and how well your heart's chambers and valves are working. This procedure takes approximately one hour. There are no restrictions for this procedure. To be done in October 2019    Follow-Up: Your physician recommends that you schedule a follow-up appointment in: 12 months. Please call our office in about 9 months to schedule this appointment   Any Other Special Instructions Will Be Listed Below (If Applicable).     If you need a refill on your cardiac medications before your next appointment, please call your pharmacy.

## 2017-07-25 NOTE — Progress Notes (Signed)
Chief Complaint  Patient presents with  . Follow-up    aortic insufficiency   History of Present Illness: 67 y.o. female with history of aortic insufficiency with possible bicuspid aortic valve, HTN, HLD and thoracic aortic aneurysm who is here today for cardiac follow up. Patient was previously followed by Dr. Glade Lloyd. She established with me in 2011. She has known mild AI. She was seen in our office 12/06/12 by Richardson Dopp, PA-C and noted syncopal events with bradycardia. No exertional discomfort but she did describe resting left sided chest discomfort. Stress echo on 12/20/12 with upsloping ST depression with exercise and apical hypokinesis, worrisome for ischemia. I arranged a cardiac cath on 01/03/13 which showed no evidence of coronary artery disease. Echo October 2018. 03/09/15 with normal LV systolic function, moderate AI.   She is here today for follow up. The patient denies any chest pain, dyspnea, palpitations, lower extremity edema, orthopnea, PND, dizziness, near syncope or syncope.    Primary Care Physician: Deland Pretty, MD  Past Medical History:  Diagnosis Date  . Abnormal stress test    a. 09/2008, ST depression on ETT @ [facility], no further w/u.  . Adenomatous colon polyp   . Anxiety   . Anxiety and depression   . Aortic insufficiency    a. 06/2012 Echo: EF 55-60%, Gr 1 DD, Mild AI, Triv MR, mildly dil LA, nl RV fxn.  Marland Kitchen History of melanoma    Followed with Dr. Tonia Brooms  . HTN (hypertension)   . Hyperlipidemia   . Leukocytosis    Mild  . Migraines   . Osteopenia   . Varicose veins     Past Surgical History:  Procedure Laterality Date  . BTL    . CHOLECYSTECTOMY    . DILATION AND CURETTAGE OF UTERUS    . FEMORAL HERNIA REPAIR    . High endocervical polyp removed    . LIPOMA EXCISION Right 03/23/2015   Procedure: EXCISION LARGE LIPOMA RIGHT UPPER ARM  ;  Surgeon: Youlanda Roys, MD;  Location: [facility];  Service: Plastics;   Laterality: Right;  . MELANOMA EXCISION  2003   right calf  . OVARIAN CYST REMOVAL      Current Outpatient Medications  Medication Sig Dispense Refill  . amLODipine (NORVASC) 2.5 MG tablet Take 2.5 mg by mouth daily.    . Calcium Carbonate-Vitamin D (CALCIUM-VITAMIN D3) 600-200 MG-UNIT TABS Take 2 tablets by mouth daily.     . Cholecalciferol (VITAMIN D) 1000 UNITS capsule Take 1,000 Units by mouth daily.     . fish oil-omega-3 fatty acids 1000 MG capsule Take 1 g by mouth daily.      . Glucosamine-Chondroitin (GLUCOSAMINE CHONDR COMPLEX PO) Take 1 Dose by mouth daily.    Marland Kitchen ibuprofen (ADVIL,MOTRIN) 600 MG tablet Take 600 mg by mouth every 6 (six) hours as needed.    . multivitamin (THERAGRAN) per tablet Take 1 tablet by mouth daily.      . sertraline (ZOLOFT) 25 MG tablet Take 25 mg by mouth daily.     No current facility-administered medications for this visit.     Allergies  Allergen Reactions  . Indocin [Indomethacin] Other (See Comments)    Stomach upset  . Ibuprofen Other (See Comments)    Grogginess  . Naproxen Other (See Comments)    CAUSE GI [place] OR HAZZY  . Penicillins Other (See Comments)    "flu like symptoms"  . Triamterene Other (See Comments)  False gout    Social History   Socioeconomic History  . Marital status: Married    Spouse name: Not on file  . Number of children: Not on file  . Years of education: Not on file  . Highest education level: Not on file  Social Needs  . Financial resource strain: Not on file  . Food insecurity - worry: Not on file  . Food insecurity - inability: Not on file  . Transportation needs - medical: Not on file  . Transportation needs - non-medical: Not on file  Occupational History  . Occupation: Futures trader    Comment: Freelance  Tobacco Use  . Smoking status: Never Smoker  . Smokeless tobacco: Never Used  Substance and Sexual Activity  . Alcohol use: Yes    Alcohol/week: 0.5 oz    Types: 1  Standard drinks or equivalent per week    Comment: 1 glass of wine per week  . Drug use: No  . Sexual activity: Not on file  Other Topics Concern  . Not on file  Social History Narrative   Married   1 daughter   Gets regular exercise: Walking, yoga, weight lifting    Family History  Problem Relation Age of Onset  . Hypertension Mother   . Migraines Mother   . Hypertension Father   . Lung cancer Father     Review of Systems:  As stated in the HPI and otherwise negative.   BP (!) 158/72   Pulse 76   Ht 5\' 9"  (1.753 m)   Wt 160 lb 3.2 oz (72.7 kg)   SpO2 99%   BMI 23.66 kg/m   Physical Examination:  General: Well developed, well nourished, NAD  HEENT: OP clear, mucus membranes moist  SKIN: warm, dry. No rashes. Neuro: No focal deficits  Musculoskeletal: Muscle strength 5/5 all ext  Psychiatric: Mood and affect normal  Neck: No JVD, no carotid bruits, no thyromegaly, no lymphadenopathy.  Lungs:Clear bilaterally, no wheezes, rhonci, crackles Cardiovascular: Regular rate and rhythm. Soft murmur.  Abdomen:Soft. Bowel sounds present. Non-tender.  Extremities: No lower extremity edema. Pulses are 2 + in the bilateral DP/PT.  Echo 03/13/17: - Left ventricle: The cavity size was normal. Systolic function was   normal. The estimated ejection fraction was in the range of 55%   to 60%. Wall motion was normal; there were no regional wall   motion abnormalities. Doppler parameters are consistent with   abnormal left ventricular relaxation (grade 1 diastolic   dysfunction). - Aortic valve: Trileaflet; mildly thickened, mildly calcified   leaflets. There was moderate regurgitation. - Aorta: Ascending aortic diameter: 40 mm (S). - Ascending aorta: The ascending aorta was mildly dilated. - Mitral valve: Mild prolapse, involving the posterior leaflet.   There was mild regurgitation. - Left atrium: The atrium was mildly dilated.  Impressions:  - No significant change when  compared to personally viewed prior   echocardiogram.   Cardiac cath 01/03/13: 1. No angiographic evidence of CAD  2. Normal LV systolic function  EKG:  EKG is ordered today. The ekg ordered today demonstrates Sinus, PVC. Poor R wave progression, unchanged.   Recent Labs: No results found for requested labs within last 8760 hours.   Lipid Panel No results found for: CHOL, TRIG, HDL, CHOLHDL, VLDL, LDLCALC, LDLDIRECT   Wt Readings from Last 3 Encounters:  07/25/17 160 lb 3.2 oz (72.7 kg)  07/17/16 161 lb 12.8 oz (73.4 kg)  03/23/15 157 lb (71.2 kg)  Other studies Reviewed: Additional studies/ records that were reviewed today include: . Review of the above records demonstrates:    Assessment and Plan:   1. HTN: BP is controlled at home. Followed in primary care.  2. Aortic insufficiency: Moderate by echo in October 2018 but felt to be stable from echo in 2016 by Dr. Marlou Porch. Will repeat echo in October 2018.   3. Thoracic aortic aneurysm: She has mild dilation of her ascending aorta on echo. Will arrange chest MRA to assess aorta. She does not wish to have a chest CTA due to fear of radiation exposure.   Current medicines are reviewed at length with the patient today.  The patient does not have concerns regarding medicines.  The following changes have been made:  no change  Labs/ tests ordered today include:   Orders Placed This Encounter  Procedures  . MR Angiogram Chest W Wo Contrast  . Basic Metabolic Panel (BMET)  . EKG 12-Lead  . ECHOCARDIOGRAM COMPLETE    Disposition:   FU with me in 12  months  Signed, Lauree Chandler, MD 07/25/2017 11:40 AM    [facility] [place], [place], [place]  [address] Phone: [phone]; Fax: [phone]

## 2017-08-13 ENCOUNTER — Inpatient Hospital Stay: Admission: RE | Admit: 2017-08-13 | Payer: Medicare Other | Source: Ambulatory Visit

## 2017-08-28 DIAGNOSIS — M79672 Pain in left foot: Secondary | ICD-10-CM | POA: Diagnosis not present

## 2017-08-28 DIAGNOSIS — M1180 Other specified crystal arthropathies, unspecified site: Secondary | ICD-10-CM | POA: Diagnosis not present

## 2017-08-30 DIAGNOSIS — E7800 Pure hypercholesterolemia, unspecified: Secondary | ICD-10-CM | POA: Diagnosis not present

## 2017-09-03 ENCOUNTER — Ambulatory Visit
Admission: RE | Admit: 2017-09-03 | Discharge: 2017-09-03 | Disposition: A | Discharge status: Home / Self Care | Payer: Medicare Other | Source: Ambulatory Visit | Attending: Cardiovascular Disease | Admitting: Cardiovascular Disease

## 2017-09-03 DIAGNOSIS — I712 Thoracic aortic aneurysm, without rupture, unspecified: Secondary | ICD-10-CM

## 2017-09-03 MED ORDER — GADOBENATE DIMEGLUMINE 529 MG/ML IV SOLN
14.0000 mL | Freq: Once | INTRAVENOUS | Status: AC | PRN
  Administered 2017-09-03: 14 mL via INTRAVENOUS

## 2017-09-05 ENCOUNTER — Telehealth: Payer: Self-pay | Admitting: Cardiovascular Disease

## 2017-09-05 DIAGNOSIS — E7800 Pure hypercholesterolemia, unspecified: Secondary | ICD-10-CM | POA: Diagnosis not present

## 2017-09-05 NOTE — Telephone Encounter (Signed)
I spoke with pt and reviewed MRA results with her.

## 2017-09-05 NOTE — Telephone Encounter (Signed)
Follow Up: ° ° ° °Returning your call from this morning. °

## 2017-09-19 DIAGNOSIS — E7800 Pure hypercholesterolemia, unspecified: Secondary | ICD-10-CM | POA: Diagnosis not present

## 2017-09-19 DIAGNOSIS — J069 Acute upper respiratory infection, unspecified: Secondary | ICD-10-CM | POA: Diagnosis not present

## 2017-09-19 DIAGNOSIS — R05 Cough: Secondary | ICD-10-CM | POA: Diagnosis not present

## 2017-09-19 DIAGNOSIS — I10 Essential (primary) hypertension: Secondary | ICD-10-CM | POA: Diagnosis not present

## 2017-09-19 DIAGNOSIS — J329 Chronic sinusitis, unspecified: Secondary | ICD-10-CM | POA: Diagnosis not present

## 2017-10-04 DIAGNOSIS — Z803 Family history of malignant neoplasm of breast: Secondary | ICD-10-CM | POA: Diagnosis not present

## 2017-10-04 DIAGNOSIS — Z1231 Encounter for screening mammogram for malignant neoplasm of breast: Secondary | ICD-10-CM | POA: Diagnosis not present

## 2017-10-23 DIAGNOSIS — M25561 Pain in right knee: Secondary | ICD-10-CM | POA: Diagnosis not present

## 2017-10-23 DIAGNOSIS — M25562 Pain in left knee: Secondary | ICD-10-CM | POA: Diagnosis not present

## 2017-11-21 DIAGNOSIS — M7542 Impingement syndrome of left shoulder: Secondary | ICD-10-CM | POA: Diagnosis not present

## 2017-11-21 DIAGNOSIS — M25512 Pain in left shoulder: Secondary | ICD-10-CM | POA: Diagnosis not present

## 2018-02-25 ENCOUNTER — Other Ambulatory Visit: Payer: Self-pay

## 2018-02-25 ENCOUNTER — Ambulatory Visit (HOSPITAL_COMMUNITY): Payer: Medicare Other | Attending: Cardiology

## 2018-02-25 DIAGNOSIS — I351 Nonrheumatic aortic (valve) insufficiency: Secondary | ICD-10-CM

## 2018-02-27 DIAGNOSIS — M170 Bilateral primary osteoarthritis of knee: Secondary | ICD-10-CM | POA: Diagnosis not present

## 2018-02-27 DIAGNOSIS — Z23 Encounter for immunization: Secondary | ICD-10-CM | POA: Diagnosis not present

## 2018-02-27 DIAGNOSIS — M1180 Other specified crystal arthropathies, unspecified site: Secondary | ICD-10-CM | POA: Diagnosis not present

## 2018-03-06 DIAGNOSIS — M25561 Pain in right knee: Secondary | ICD-10-CM | POA: Diagnosis not present

## 2018-03-06 DIAGNOSIS — M25562 Pain in left knee: Secondary | ICD-10-CM | POA: Diagnosis not present

## 2018-03-26 DIAGNOSIS — D485 Neoplasm of uncertain behavior of skin: Secondary | ICD-10-CM | POA: Diagnosis not present

## 2018-03-26 DIAGNOSIS — D220 Melanocytic nevi of lip: Secondary | ICD-10-CM | POA: Diagnosis not present

## 2018-03-26 DIAGNOSIS — L821 Other seborrheic keratosis: Secondary | ICD-10-CM | POA: Diagnosis not present

## 2018-03-26 DIAGNOSIS — L820 Inflamed seborrheic keratosis: Secondary | ICD-10-CM | POA: Diagnosis not present

## 2018-03-26 DIAGNOSIS — L738 Other specified follicular disorders: Secondary | ICD-10-CM | POA: Diagnosis not present

## 2018-03-26 DIAGNOSIS — L814 Other melanin hyperpigmentation: Secondary | ICD-10-CM | POA: Diagnosis not present

## 2018-04-26 DIAGNOSIS — I10 Essential (primary) hypertension: Secondary | ICD-10-CM | POA: Diagnosis not present

## 2018-06-04 DIAGNOSIS — H33103 Unspecified retinoschisis, bilateral: Secondary | ICD-10-CM | POA: Diagnosis not present

## 2018-06-04 DIAGNOSIS — D3131 Benign neoplasm of right choroid: Secondary | ICD-10-CM | POA: Diagnosis not present

## 2018-06-04 DIAGNOSIS — H5213 Myopia, bilateral: Secondary | ICD-10-CM | POA: Diagnosis not present

## 2018-06-26 DIAGNOSIS — E7800 Pure hypercholesterolemia, unspecified: Secondary | ICD-10-CM | POA: Diagnosis not present

## 2018-06-26 DIAGNOSIS — I10 Essential (primary) hypertension: Secondary | ICD-10-CM | POA: Diagnosis not present

## 2018-06-28 ENCOUNTER — Encounter: Payer: Self-pay | Admitting: Cardiovascular Disease

## 2018-07-01 DIAGNOSIS — D72819 Decreased white blood cell count, unspecified: Secondary | ICD-10-CM | POA: Diagnosis not present

## 2018-07-01 DIAGNOSIS — Z0000 Encounter for general adult medical examination without abnormal findings: Secondary | ICD-10-CM | POA: Diagnosis not present

## 2018-07-01 DIAGNOSIS — Z85820 Personal history of malignant melanoma of skin: Secondary | ICD-10-CM | POA: Diagnosis not present

## 2018-07-03 ENCOUNTER — Other Ambulatory Visit: Payer: Self-pay | Admitting: Internal Medicine

## 2018-07-03 DIAGNOSIS — I83813 Varicose veins of bilateral lower extremities with pain: Secondary | ICD-10-CM

## 2018-07-16 ENCOUNTER — Encounter: Payer: Self-pay | Admitting: Physician Assistant

## 2018-07-17 ENCOUNTER — Other Ambulatory Visit: Payer: Medicare Other

## 2018-07-25 ENCOUNTER — Ambulatory Visit
Admission: RE | Admit: 2018-07-25 | Discharge: 2018-07-25 | Disposition: A | Discharge status: Home / Self Care | Payer: Medicare Other | Source: Ambulatory Visit | Attending: Internal Medicine | Admitting: Internal Medicine

## 2018-07-25 DIAGNOSIS — I83813 Varicose veins of bilateral lower extremities with pain: Secondary | ICD-10-CM | POA: Diagnosis not present

## 2018-07-25 DIAGNOSIS — I83811 Varicose veins of right lower extremities with pain: Secondary | ICD-10-CM | POA: Diagnosis not present

## 2018-07-25 NOTE — Consult Note (Signed)
Chief Complaint:  Left greater than right calf varicosities, leg pain, fatigue  Referring Physician(s): Pharr,Walter  History of Present Illness: Kristine Jones is a 68 y.o. 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  The left lower extremity now has become symptomatic.  She has enlarging anterior tibial varicosities with associated leg pain and fatigue over the region.  This has progressed over the last few years.  No associated skin lesions.  No associated venous dermatitis or stasis changes.  Review of her venous history performed.  No prior transfer varicose veins or spider veins.  No history of DVT or thrombophlebitis.  She has had no prior vein removal or injections.  She is retired.  She does walk daily.  She also goes to the gym at least twice a week.  She has a very active lifestyle and excellent functional status.  The anterior tibial varicosities are starting to affect her activity.  Past Medical History:  Diagnosis Date   Abnormal stress test    a. 09/2008, ST depression on ETT @ [place], no further w/u.   Adenomatous colon polyp    Anxiety    Anxiety and depression    Aortic insufficiency    a. 06/2012 Echo: EF 55-60%, Gr 1 DD, Mild AI, Triv MR, mildly dil LA, nl RV fxn.   History of melanoma    Followed with Dr. Tonia Brooms   HTN (hypertension)    Hyperlipidemia    Leukocytosis    Mild   Migraines    Osteopenia    Varicose veins     Past Surgical History:  Procedure Laterality Date   BTL     CHOLECYSTECTOMY     DILATION AND CURETTAGE OF UTERUS     FEMORAL HERNIA REPAIR     High endocervical polyp removed     LIPOMA EXCISION Right 03/23/2015   Procedure: EXCISION LARGE LIPOMA RIGHT UPPER ARM  ;  Surgeon: Youlanda Roys, MD;  Location: [facility];  Service: Plastics;  Laterality: Right;   MELANOMA EXCISION  2003   right calf   OVARIAN CYST REMOVAL      Allergies: Indocin  [indomethacin]; Ibuprofen; Naproxen; Penicillins; and Triamterene  Medications: Prior to Admission medications   Medication Sig Start Date End Date Taking? Authorizing Provider  amLODipine (NORVASC) 2.5 MG tablet Take 2.5 mg by mouth daily.   Yes [provider]  Calcium Carbonate-Vitamin D (CALCIUM-VITAMIN D3) 600-200 MG-UNIT TABS Take 2 tablets by mouth daily.    Yes [provider]  Cholecalciferol (VITAMIN D) 1000 UNITS capsule Take 1,000 Units by mouth daily.    Yes [provider]  fish oil-omega-3 fatty acids 1000 MG capsule Take 1 g by mouth daily.     Yes [provider]  fluticasone (FLONASE) 50 MCG/ACT nasal spray Place 1 spray into both nostrils daily as needed for allergies or rhinitis.   Yes [provider]  ibuprofen (ADVIL,MOTRIN) 600 MG tablet Take 600 mg by mouth every 6 (six) hours as needed.   Yes [provider]  multivitamin [facility]) per tablet Take 1 tablet by mouth daily.     Yes [provider]  sertraline (ZOLOFT) 25 MG tablet Take 25 mg by mouth daily.   Yes [provider]  Glucosamine-Chondroitin (GLUCOSAMINE CHONDR COMPLEX PO) Take 1 Dose by mouth daily.    [provider]     Family History  Problem Relation Age of Onset  Hypertension Mother    Migraines Mother    Hypertension Father    Lung cancer Father     Social History   Socioeconomic History   Marital status: Married    Spouse name: Not on file   Number of children: Not on file   Years of education: Not on file   Highest education level: Not on file  Occupational History   Occupation: Futures trader    Comment: Freelance  Social Needs   Financial resource strain: Not on file   Food insecurity:    Worry: Not on file    Inability: Not on file   Transportation needs:    Medical: Not on file    Non-medical: Not on file  Tobacco Use   Smoking status: Never Smoker   Smokeless tobacco: Never  Used  Substance and Sexual Activity   Alcohol use: Yes    Alcohol/week: 1.0 standard drinks    Types: 1 Standard drinks or equivalent per week    Comment: 1 glass of wine per week   Drug use: No   Sexual activity: Not on file  Lifestyle   Physical activity:    Days per week: Not on file    Minutes per session: Not on file   Stress: Not on file  Relationships   Social connections:    Talks on phone: Not on file    Gets together: Not on file    Attends religious service: Not on file    Active member of club or organization: Not on file    Attends meetings of clubs or organizations: Not on file    Relationship status: Not on file  Other Topics Concern   Not on file  Social History Narrative   Married   1 daughter   Gets regular exercise: Walking, yoga, weight lifting    Review of Systems: A 12 point ROS discussed and pertinent positives are indicated in the HPI above.  All other systems are negative.  Review of Systems  Vital Signs: BP (!) 179/70    Pulse 71    Temp (!) 97.3 F (36.3 C) (Oral)    Resp 16    Ht 5\' 9"  (1.753 m)    Wt 71.7 kg    SpO2 98%    BMI 23.33 kg/m   Physical Exam Constitutional:      General: She is not in acute distress.    Appearance: She is normal weight. She is not toxic-appearing.  Musculoskeletal: Normal range of motion.        General: No swelling.     Right lower leg: No edema.     Left lower leg: No edema.     Comments: Left anterior tibial tortuous compressible varicosity.  This actually begins in the left distal thigh extends over the knee to the tibial region.  No signs of phlebitis or thrombophlebitis.  No signs of cellulitis.  Skin intact.  No ulcerations.  Minor right lower calf and ankle subsurface varicosities developing.  Normal pedal pulses  Skin:    General: Skin is warm and dry.     Findings: No bruising, erythema or rash.      Imaging: US Venous Img Lower Bilateral  Result Date: 07/25/2018 CLINICAL DATA:   Left greater than right calf varicosities, associated leg pain and fatigue EXAM: BILATERAL LOWER EXTREMITY VENOUS DOPPLER ULTRASOUND TECHNIQUE: Gray-scale sonography with graded compression, as well as color Doppler and duplex ultrasound were performed to evaluate the lower extremity deep venous systems  from the level of the common femoral vein and including the common femoral, femoral, profunda femoral, popliteal and calf veins including the posterior tibial, peroneal and gastrocnemius veins when visible. The superficial great saphenous vein was also interrogated. Spectral Doppler was utilized to evaluate flow at rest and with distal augmentation maneuvers in the common femoral, femoral and popliteal veins. COMPARISON:  None. FINDINGS: RIGHT LOWER EXTREMITY Common Femoral Vein: No evidence of thrombus. Normal compressibility, respiratory phasicity and response to augmentation. Saphenofemoral Junction: No evidence of thrombus. Normal compressibility and flow on color Doppler imaging. Negative for reflux. Profunda Femoral Vein: No evidence of thrombus. Normal compressibility and flow on color Doppler imaging. Femoral Vein: No evidence of thrombus. Normal compressibility, respiratory phasicity and response to augmentation. Popliteal Vein: No evidence of thrombus. Normal compressibility, respiratory phasicity and response to augmentation. Calf Veins: No evidence of thrombus. Normal compressibility and flow on color Doppler imaging. Superficial Great Saphenous Vein: No evidence of thrombus. Normal compressibility. Negative for reflux or venous insufficiency. Small saphenous vein: Minor mid calf reflux, less than 2 seconds. No significant dilatation. Small branching early varicosities noted. Venous Reflux:  Minor right S SV reflux as above Other Findings:  None. LEFT LOWER EXTREMITY Common Femoral Vein: No evidence of thrombus. Normal compressibility, respiratory phasicity and response to augmentation. Saphenofemoral  Junction: No evidence of thrombus. Normal compressibility and flow on color Doppler imaging. Negative for reflux. Profunda Femoral Vein: No evidence of thrombus. Normal compressibility and flow on color Doppler imaging. Femoral Vein: No evidence of thrombus. Normal compressibility, respiratory phasicity and response to augmentation. Popliteal Vein: No evidence of thrombus. Normal compressibility, respiratory phasicity and response to augmentation. Calf Veins: No evidence of thrombus. Normal compressibility and flow on color Doppler imaging. Superficial Great Saphenous Vein: Left GSV demonstrates venous insufficiency/reflux beginning in the mid thigh lasting for approximately 4 seconds. Minor left GSV dilatation measuring 5 mm in maximal diameter. Mid thigh branching varicosity noted at the level of the refluxing GSV. This varicosity extends along the thigh, knee and anterior tibial region. This is the patient's symptomatic varicosity. Left GSV anatomy is amenable to transcatheter laser occlusion. No significant aneurysmal segments are tortuosity. Left small saphenous vein is normal. Venous Reflux:  As above Other Findings:  None. IMPRESSION: Negative for DVT in either extremity. Left distal thigh GSV venous insufficiency with associated branching thigh, knee and anterior tibial varicosities. This is the patient's symptomatic varicosity. Left GSV anatomy is amenable to transcatheter laser occlusion. No significant right lower extremity saphenous reflux. Electronically Signed   By: Jerilynn Mages.  Caliann Leckrone M.D.   On: 07/25/2018 16:04   [place] [facility]  Result Date: 07/25/2018 Please refer to "Notes" to see consult details.   Labs:  CBC: No results for input(s): WBC, HGB, HCT, PLT in the last 8760 hours.  COAGS: No results for input(s): INR, APTT in the last 8760 hours.  BMP: No results for input(s): NA, K, CL, CO2, GLUCOSE, BUN, CALCIUM, CREATININE, GFRNONAA, GFRAA in the last 8760 hours.  Invalid input(s):  CMP  LIVER FUNCTION TESTS: No results for input(s): BILITOT, AST, ALT, ALKPHOS, PROT, ALBUMIN in the last 8760 hours.  Assessment and Plan:  Symptomatic left distal thigh, knee and tibial tortures varicosity secondary to left mid thigh GSV venous insufficiency.  Left GSV anatomy is amenable to transcatheter laser occlusion.  Varicosities minimal to ultrasound foam sclerotherapy.  The procedures were reviewed in detail including the expected goals, recovery and outcomes.  All questions were addressed.  She would  like to do submit the information for insurance evaluation and consider doing the procedure in the fall.  Plan: Begin thigh high prescription strength compression stockings for venous insufficiency as above.  Continue daily exercise.  Anticipate left lower extremity laser treatment and varicose vein sclerotherapy in the next few months.  Thank you for this interesting consult.  I greatly enjoyed [facility] and look forward to participating in their care.  A copy of this report was sent to the requesting provider on this date.  Electronically Signed: Greggory Keen 07/25/2018, 4:10 PM   I spent a total of  40 Minutes   in face to face in clinical consultation, greater than 50% of which was counseling/coordinating care for this patient with venous insufficiency and left greater than right varicosities

## 2018-07-29 ENCOUNTER — Ambulatory Visit: Payer: Medicare Other | Admitting: Cardiovascular Disease

## 2018-07-31 ENCOUNTER — Ambulatory Visit: Payer: Medicare Other | Admitting: Physician Assistant

## 2018-09-11 ENCOUNTER — Telehealth: Payer: Self-pay | Admitting: Cardiovascular Disease

## 2018-09-11 ENCOUNTER — Telehealth: Payer: Self-pay | Admitting: *Deleted

## 2018-09-11 DIAGNOSIS — H43812 Vitreous degeneration, left eye: Secondary | ICD-10-CM | POA: Diagnosis not present

## 2018-09-11 NOTE — Telephone Encounter (Signed)
Follow up: ° ° ° °Patient returning your call back.  °

## 2018-09-11 NOTE — Telephone Encounter (Signed)
lvm to call back

## 2018-09-11 NOTE — Telephone Encounter (Signed)
LM WITH PT HUSBAND TO CALL BACK  ABOUT APPT 5-5-

## 2018-09-11 NOTE — Telephone Encounter (Signed)
SPOKE TO PT AND PT CONSENTED TELEPHONE      YOUR CARDIOLOGY TEAM HAS ARRANGED FOR AN E-VISIT FOR YOUR APPOINTMENT - PLEASE REVIEW IMPORTANT INFORMATION BELOW SEVERAL DAYS PRIOR TO YOUR APPOINTMENT  CONSENT FOR TELE-HEALTH VISIT - PLEASE REVIEW  I hereby voluntarily request, consent and authorize [facility] and its employed or contracted physicians, physician assistants, nurse practitioners or other licensed health care professionals (the Practitioner), to provide me with telemedicine health care services (the "Services") as deemed necessary by the treating Practitioner. I acknowledge and consent to receive the Services by the Practitioner via telemedicine. I understand that the telemedicine visit will involve communicating with the Practitioner through live audiovisual communication technology and the disclosure of certain medical information by electronic transmission. I acknowledge that I have been given the opportunity to request an in-person assessment or other available alternative prior to the telemedicine visit and am voluntarily participating in the telemedicine visit.  I understand that I have the right to withhold or withdraw my consent to the use of telemedicine in the course of my care at any time, without affecting my right to future care or treatment, and that the Practitioner or I may terminate the telemedicine visit at any time. I understand that I have the right to inspect all information obtained and/or recorded in the course of the telemedicine visit and may receive copies of available information for a reasonable fee.  I understand that some of the potential risks of receiving the Services via telemedicine include:  Marland Kitchen Delay or interruption in medical evaluation due to technological equipment failure or disruption; . Information transmitted may not be sufficient (e.g. poor resolution of images) to allow for appropriate medical decision making by the Practitioner; and/or  . In rare  instances, security protocols could fail, causing a breach of personal health information.  Furthermore, I acknowledge that it is my responsibility to provide information about my medical history, conditions and care that is complete and accurate to the best of my ability. I acknowledge that Practitioner's advice, recommendations, and/or decision may be based on factors not within their control, such as incomplete or inaccurate data provided by me or distortions of diagnostic images or specimens that may result from electronic transmissions. I understand that the practice of medicine is not an exact science and that Practitioner makes no warranties or guarantees regarding treatment outcomes. I acknowledge that I will receive a copy of this consent concurrently upon execution via email to the email address I last provided but may also request a printed copy by calling the office of [facility].    I understand that my insurance will be billed for this visit.   I have read or had this consent read to me. . I understand the contents of this consent, which adequately explains the benefits and risks of the Services being provided via telemedicine.  . I have been provided ample opportunity to ask questions regarding this consent and the Services and have had my questions answered to my satisfaction. . I give my informed consent for the services to be provided through the use of telemedicine in my medical care  By participating in this telemedicine visit I agree to the above. Please be sure to join your visit at least 10 minutes before your scheduled time.

## 2018-09-13 DIAGNOSIS — H43812 Vitreous degeneration, left eye: Secondary | ICD-10-CM | POA: Diagnosis not present

## 2018-09-16 NOTE — Progress Notes (Signed)
Virtual Visit via Telephone Note   This visit type was conducted due to national recommendations for restrictions regarding the COVID-19 Pandemic (e.g. social distancing) in an effort to limit this patient's exposure and mitigate transmission in our community.  Due to her co-morbid illnesses, this patient is at least at moderate risk for complications without adequate follow up.  This format is felt to be most appropriate for this patient at this time.  The patient did not have access to video technology/had technical difficulties with video requiring transitioning to audio format only (telephone).  All issues noted in this document were discussed and addressed.  No physical exam could be performed with this format.  Please refer to the patient's chart for her  consent to telehealth for [facility].   Date:  09/17/2018   ID:  Kristine, Jones 1951/04/23, MRN [id number]  Patient Location: Home Provider Location: Home  PCP:  Deland Pretty, MD  Cardiologist:  Lauree Chandler, MD   Electrophysiologist:  None   Evaluation Performed:  Follow-Up Visit  Chief Complaint:  Aortic insufficiency and thoracic aortic aneurysm follow up  History of Present Illness:    Kristine Jones is a 68 y.o. female with aortic insufficiency with possible bicuspid aortic valve, hypertension, hyperlipidemia, thoracic aortic aneurysm.  Cardiac Catheterization in 2014 showed no evidence of coronary artery disease.  She was last seen by Dr. Angelena Form in 07/2017.    Today, she notes she has been doing well.  She has not had chest discomfort, shortness of breath, syncope, orthopnea, paroxysmal nocturnal dyspnea or lower extremity swelling.  The patient does not have symptoms concerning for COVID-19 infection (fever, chills, cough, or new shortness of breath).    Past Medical History:  Diagnosis Date  . Abnormal stress test    a. 09/2008, ST depression on ETT @ [place], no further w/u.  .  Adenomatous colon polyp   . Anxiety   . Anxiety and depression   . Aortic insufficiency    a. 06/2012 Echo: EF 55-60%, Gr 1 DD, Mild AI, Triv MR, mildly dil LA, nl RV fxn.  Marland Kitchen History of melanoma    Followed with Dr. Tonia Brooms  . HTN (hypertension)   . Hyperlipidemia   . Leukocytosis    Mild  . Migraines   . Osteopenia   . Varicose veins    Past Surgical History:  Procedure Laterality Date  . BTL    . CHOLECYSTECTOMY    . DILATION AND CURETTAGE OF UTERUS    . FEMORAL HERNIA REPAIR    . High endocervical polyp removed    . LIPOMA EXCISION Right 03/23/2015   Procedure: EXCISION LARGE LIPOMA RIGHT UPPER ARM  ;  Surgeon: Youlanda Roys, MD;  Location: [facility];  Service: Plastics;  Laterality: Right;  . MELANOMA EXCISION  2003   right calf  . OVARIAN CYST REMOVAL       Current Meds  Medication Sig  . amLODipine (NORVASC) 2.5 MG tablet Take 2.5 mg by mouth daily.  . Calcium Carbonate-Vitamin D (CALCIUM-VITAMIN D3) 600-200 MG-UNIT TABS Take 2 tablets by mouth daily.   . Cholecalciferol (VITAMIN D) 1000 UNITS capsule Take 1,000 Units by mouth daily.   Marland Kitchen estradiol (ESTRACE) 0.1 MG/GM vaginal cream estradiol 0.01% (0.1 mg/gram) vaginal cream  . fish oil-omega-3 fatty acids 1000 MG capsule Take 1 g by mouth daily.    . fluticasone (FLONASE) 50 MCG/ACT nasal spray Place 1 spray into both nostrils daily  as needed for allergies or rhinitis.  Marland Kitchen ibuprofen (ADVIL,MOTRIN) 600 MG tablet Take 600 mg by mouth every 6 (six) hours as needed.  . multivitamin (THERAGRAN) per tablet Take 1 tablet by mouth daily.    . sertraline (ZOLOFT) 50 MG tablet Take 50 mg by mouth daily.     Allergies:   Indocin [indomethacin]; Ibuprofen; Naproxen; Penicillins; and Triamterene   Social History   Tobacco Use  . Smoking status: Never Smoker  . Smokeless tobacco: Never Used  Substance Use Topics  . Alcohol use: Yes    Alcohol/week: 1.0 standard drinks    Types: 1 Standard drinks or  equivalent per week    Comment: 1 glass of wine per week  . Drug use: No     Family Hx: The patient's family history includes Hypertension in her father and mother; Lung cancer in her father; Migraines in her mother.  ROS:   Please see the history of present illness.    All other systems reviewed and are negative.   Prior CV studies:   The following studies were reviewed today:  Echo 02/25/18 EF 65-70, no RWMA, Gr 1 DD, mod AI, mild MR, mild LAE, normal RVSF, mild TR  Chest MRA 09/03/17 IMPRESSION: VASCULAR 1. Mild fusiform aneurysmal dilatation of the tubular portion of the ascending thoracic aorta with a maximal diameter of 4.1 cm. No evidence of aortic root dilatation or effacement of the sino-tubular junction. NON-VASCULAR 1. No acute abnormality or clinically significant finding. 2. Incidental note is made of a 3.9 cm simple hepatic cyst.  Cardiac Catheterization 01/03/13 Impression: 1. No angiographic evidence of CAD 2. Normal LV systolic function  Stress Echo 12/20/12 Impressions - Stress echocardiogram with no chest pain; nondiagnostic, upsloping ST depression; there appears to be hypokinesis of the apex with peak exercise on apical 4 chamber view consistent with ischemia.    Labs/Other Tests and Data Reviewed:    EKG:  No ECG reviewed.  Recent Labs: No results found for requested labs within last 8760 hours.   Recent Lipid Panel No results found for: CHOL, TRIG, HDL, CHOLHDL, LDLCALC, LDLDIRECT  Wt Readings from Last 3 Encounters:  09/17/18 156 lb (70.8 kg)  07/25/18 158 lb (71.7 kg)  07/25/17 160 lb 3.2 oz (72.7 kg)     Objective:    Vital Signs:  BP 114/72   Pulse 68   Ht 5\' 9"  (1.753 m)   Wt 156 lb (70.8 kg)   BMI 23.04 kg/m    VITAL SIGNS:  reviewed GEN:  no acute distress RESPIRATORY:  No labored breathing noted during conversation NEURO:  Alert and oriented PSYCH:  She seems to be in good spirits  ASSESSMENT & PLAN:      Nonrheumatic aortic valve insufficiency Moderate by recent echocardiogram.  She is asymptomatic.  We reviewed the findings of her last echocardiogram.  Plan follow-up study in 6 months.  Thoracic aortic aneurysm without rupture (HCC) 4.1 cm by chest MRA 08/2017.  The patient's blood pressure is controlled on her current regimen.  Continue current therapy.   COVID-19 Education: The signs and symptoms of COVID-19 were discussed with the patient and how to seek care for testing (follow up with PCP or arrange E-visit).  The importance of social distancing was discussed today.  Time:   Today, I have spent 15 minutes with the patient with telehealth technology discussing the above problems.     Medication Adjustments/Labs and Tests Ordered: Current medicines are reviewed at length with the  patient today.  Concerns regarding medicines are outlined above.   Tests Ordered: Orders Placed This Encounter  Procedures  . ECHOCARDIOGRAM COMPLETE    Medication Changes: No orders of the defined types were placed in this encounter.   Disposition:  Follow up in 6 month(s)  Signed, Richardson Dopp, PA-C  09/17/2018 5:01 PM    [facility]

## 2018-09-17 ENCOUNTER — Other Ambulatory Visit: Payer: Self-pay

## 2018-09-17 ENCOUNTER — Encounter

## 2018-09-17 ENCOUNTER — Telehealth (INDEPENDENT_AMBULATORY_CARE_PROVIDER_SITE_OTHER): Payer: Medicare Other | Admitting: Physician Assistant

## 2018-09-17 ENCOUNTER — Encounter: Payer: Self-pay | Admitting: Physician Assistant

## 2018-09-17 VITALS — BP 114/72 | HR 68 | Ht 69.0 in | Wt 156.0 lb

## 2018-09-17 DIAGNOSIS — I351 Nonrheumatic aortic (valve) insufficiency: Secondary | ICD-10-CM

## 2018-09-17 DIAGNOSIS — I712 Thoracic aortic aneurysm, without rupture, unspecified: Secondary | ICD-10-CM

## 2018-09-17 DIAGNOSIS — Z7189 Other specified counseling: Secondary | ICD-10-CM

## 2018-09-17 NOTE — Patient Instructions (Signed)
Medication Instructions:  No changes  If you need a refill on your cardiac medications before your next appointment, please call your pharmacy.   Lab work: None  If you have labs (blood work) drawn today and your tests are completely normal, you will receive your results only by: Marland Kitchen MyChart Message (if you have MyChart) OR . A paper copy in the mail If you have any lab test that is abnormal or we need to change your treatment, we will call you to review the results.  Testing/Procedures: In 6 months - Echocardiogram  Follow-Up: At [facility], you and your health needs are our priority.  As part of our continuing mission to provide you with exceptional heart care, we have created designated Provider Care Teams.  These Care Teams include your primary Cardiologist (physician) and Advanced Practice Providers (APPs -  Physician Assistants and Nurse Practitioners) who all work together to provide you with the care you need, when you need it. You will need a follow up appointment in:  6 months.  Please call our office 2 months in advance to schedule this appointment.  You may see Lauree Chandler, MD or Richardson Dopp, PA-C   Any Other Special Instructions Will Be Listed Below (If Applicable).

## 2018-11-22 DIAGNOSIS — L918 Other hypertrophic disorders of the skin: Secondary | ICD-10-CM | POA: Diagnosis not present

## 2018-12-25 DIAGNOSIS — H43813 Vitreous degeneration, bilateral: Secondary | ICD-10-CM | POA: Diagnosis not present

## 2018-12-25 DIAGNOSIS — H33193 Other retinoschisis and retinal cysts, bilateral: Secondary | ICD-10-CM | POA: Diagnosis not present

## 2018-12-25 DIAGNOSIS — H33302 Unspecified retinal break, left eye: Secondary | ICD-10-CM | POA: Diagnosis not present

## 2018-12-25 DIAGNOSIS — H33312 Horseshoe tear of retina without detachment, left eye: Secondary | ICD-10-CM | POA: Diagnosis not present

## 2018-12-25 DIAGNOSIS — D3131 Benign neoplasm of right choroid: Secondary | ICD-10-CM | POA: Diagnosis not present

## 2019-01-08 DIAGNOSIS — H43391 Other vitreous opacities, right eye: Secondary | ICD-10-CM | POA: Diagnosis not present

## 2019-01-08 DIAGNOSIS — H43813 Vitreous degeneration, bilateral: Secondary | ICD-10-CM | POA: Diagnosis not present

## 2019-01-10 DIAGNOSIS — Z1231 Encounter for screening mammogram for malignant neoplasm of breast: Secondary | ICD-10-CM | POA: Diagnosis not present

## 2019-01-10 DIAGNOSIS — Z803 Family history of malignant neoplasm of breast: Secondary | ICD-10-CM | POA: Diagnosis not present

## 2019-01-13 DIAGNOSIS — D72819 Decreased white blood cell count, unspecified: Secondary | ICD-10-CM | POA: Diagnosis not present

## 2019-01-13 DIAGNOSIS — I83813 Varicose veins of bilateral lower extremities with pain: Secondary | ICD-10-CM | POA: Diagnosis not present

## 2019-01-13 DIAGNOSIS — M1711 Unilateral primary osteoarthritis, right knee: Secondary | ICD-10-CM | POA: Diagnosis not present

## 2019-01-13 DIAGNOSIS — I10 Essential (primary) hypertension: Secondary | ICD-10-CM | POA: Diagnosis not present

## 2019-01-14 DIAGNOSIS — M25561 Pain in right knee: Secondary | ICD-10-CM | POA: Diagnosis not present

## 2019-02-11 ENCOUNTER — Telehealth: Payer: Self-pay

## 2019-02-11 NOTE — Telephone Encounter (Signed)
Called pt to set up apt with SW on 02/12/2019, left message asking pt to call the office.

## 2019-02-24 ENCOUNTER — Telehealth: Payer: Self-pay | Admitting: Cardiovascular Disease

## 2019-02-24 NOTE — Telephone Encounter (Signed)
New message   Patient wants to setup an annual visit with Dr. Angelena Form and she does not want to see a physician assistant. Please advise.

## 2019-02-28 NOTE — Telephone Encounter (Signed)
Left message for patient to call back  

## 2019-03-04 NOTE — Telephone Encounter (Signed)
Patient was returning a call from our office. Please call on her cell phone

## 2019-03-04 NOTE — Telephone Encounter (Signed)
lvm to call back

## 2019-03-04 NOTE — Telephone Encounter (Signed)
Spoke with patient abouut scheduling to Dr Angelena Form but has an appt already with another provider at a [facility] facility. Patient still refuses to see a PA or NP on his team. Patient was told the nurse will be message to possibly give her a call back if any cancellations before feb 2021

## 2019-03-05 DIAGNOSIS — M85851 Other specified disorders of bone density and structure, right thigh: Secondary | ICD-10-CM | POA: Diagnosis not present

## 2019-03-05 DIAGNOSIS — M8580 Other specified disorders of bone density and structure, unspecified site: Secondary | ICD-10-CM | POA: Diagnosis not present

## 2019-03-14 DIAGNOSIS — Z23 Encounter for immunization: Secondary | ICD-10-CM | POA: Diagnosis not present

## 2019-03-14 NOTE — Telephone Encounter (Signed)
Left message for patient to call back  

## 2019-03-18 NOTE — Telephone Encounter (Signed)
Left message for patient to call back  

## 2019-03-19 DIAGNOSIS — D2262 Melanocytic nevi of left upper limb, including shoulder: Secondary | ICD-10-CM | POA: Diagnosis not present

## 2019-03-19 DIAGNOSIS — L820 Inflamed seborrheic keratosis: Secondary | ICD-10-CM | POA: Diagnosis not present

## 2019-03-19 DIAGNOSIS — D220 Melanocytic nevi of lip: Secondary | ICD-10-CM | POA: Diagnosis not present

## 2019-03-19 DIAGNOSIS — I788 Other diseases of capillaries: Secondary | ICD-10-CM | POA: Diagnosis not present

## 2019-03-19 DIAGNOSIS — D225 Melanocytic nevi of trunk: Secondary | ICD-10-CM | POA: Diagnosis not present

## 2019-03-20 NOTE — Progress Notes (Signed)
Cardiology Office Note:    Date:  03/21/2019   ID:  Kristine Jones, Kristine Jones 01-04-1951, MRN [id number]  PCP:  Deland Pretty, MD  Cardiologist:  Lauree Chandler, MD   Electrophysiologist:  None   Referring MD: Deland Pretty, MD   Chief Complaint  Patient presents with  . Follow-up    Aortic Insufficiency    History of Present Illness:    Kristine Jones is a 68 y.o. 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  She returns for follow-up.  She is here alone.  Since last seen, she has done well without chest discomfort, significant shortness of breath, syncope or significant leg swelling.  She is contemplating undergoing a venous procedure for varicose veins.  She did have some transient leg pain on one occasion several months ago on the right.  She has not had a recurrence.  She does not describe claudication symptoms.  Prior CV studies:   The following studies were reviewed today:  Echo 02/25/18 EF 65-70, no RWMA, Gr 1 DD, mod AI, mild MR, mild LAE, normal RVSF, mild TR  Chest MRA 09/03/17 IMPRESSION: VASCULAR 1. Mild fusiform aneurysmal dilatation of the tubular portion of the ascending thoracic aorta with a maximal diameter of 4.1 cm. No evidence of aortic root dilatation or effacement of the sino-tubular junction. NON-VASCULAR 1. No acute abnormality or clinically significant finding. 2. Incidental note is made of a 3.9 cm simple hepatic cyst.  Cardiac Catheterization 01/03/13 Impression: 1. No angiographic evidence of CAD 2. Normal LV systolic function  Stress Echo 12/20/12 Impressions - Stress echocardiogram with no chest pain; nondiagnostic, upsloping ST depression; there appears to be hypokinesis of the apex with  peak exercise on apical 4 chamber view consistent with ischemia.   Past Medical History:  Diagnosis Date  . Abnormal stress test    a. 09/2008, ST depression on ETT @ [facility] [facility], no further w/u.  . Adenomatous colon polyp   . Anxiety   . Anxiety and depression   . Aortic insufficiency    a. 06/2012 Echo: EF 55-60%, Gr 1 DD, Mild AI, Triv MR, mildly dil LA, nl RV fxn.  Marland Kitchen History of melanoma    Followed with Dr. Tonia Brooms  . HTN (hypertension)   . Hyperlipidemia   . Leukocytosis    Mild  . Migraines   . Osteopenia   . Varicose veins    Surgical Hx: The patient  has a past surgical history that includes Dilation and curettage of uterus; High endocervical polyp removed; Cholecystectomy; Femoral hernia repair; Ovarian cyst removal; BTL; Melanoma excision (2003); and Lipoma excision (Right, 03/23/2015).   Current Medications: Current Meds  Medication Sig  . alendronate (FOSAMAX) 70 MG tablet Take 1 tablet (70 mg total) by mouth every 7 (seven) days Take with a full glass of water. Do not lie down for the next 30 min.  Marland Kitchen amLODipine (NORVASC) 2.5 MG tablet Take 2.5 mg by mouth daily.  . Calcium Carbonate-Vitamin D (CALCIUM-VITAMIN D3) 600-200 MG-UNIT TABS Take 2 tablets by mouth daily.   . Cholecalciferol (VITAMIN D) 1000 UNITS capsule Take 1,000 Units by mouth daily.   Marland Kitchen estradiol (ESTRACE) 0.1 MG/GM vaginal cream estradiol 0.01% (0.1 mg/gram) vaginal cream  .  fish oil-omega-3 fatty acids 1000 MG capsule Take 1 g by mouth daily.    . fluticasone (FLONASE) 50 MCG/ACT nasal spray Place 1 spray into both nostrils daily as needed for allergies or rhinitis.  . multivitamin (THERAGRAN) per tablet Take 1 tablet by mouth daily.    . sertraline (ZOLOFT) 50 MG tablet Take 50 mg by mouth daily.     Allergies:   Indocin [indomethacin], Ibuprofen, Naproxen, Penicillins, Lisinopril, Naproxen sodium, and Triamterene   Social History   Tobacco Use  . Smoking status: Never Smoker  . Smokeless  tobacco: Never Used  Substance Use Topics  . Alcohol use: Yes    Alcohol/week: 1.0 standard drinks    Types: 1 Standard drinks or equivalent per week    Comment: 1 glass of wine per week  . Drug use: No     Family Hx: The patient's family history includes Hypertension in her father and mother; Lung cancer in her father; Migraines in her mother.  ROS:   Please see the history of present illness.    ROS All other systems reviewed and are negative.   EKGs/Labs/Other Test Reviewed:    EKG:  EKG is  ordered today.  The ekg ordered today demonstrates normal sinus rhythm, heart rate 68, left axis deviation, septal Q waves, LVH, QTC 438, similar to prior tracing  Recent Labs: No results found for requested labs within last 8760 hours.   Recent Lipid Panel No results found for: CHOL, TRIG, HDL, CHOLHDL, LDLCALC, LDLDIRECT  Physical Exam:    VS:  BP (!) 154/90   Pulse 68   Ht 5\' 9"  (1.753 m)   Wt 159 lb (72.1 kg)   SpO2 98%   BMI 23.48 kg/m     Wt Readings from Last 3 Encounters:  03/21/19 159 lb (72.1 kg)  09/17/18 156 lb (70.8 kg)  07/25/18 158 lb (71.7 kg)     Physical Exam  Constitutional: She is oriented to person, place, and time. She appears well-developed and well-nourished. No distress.  HENT:  Head: Normocephalic and atraumatic.  Neck: Neck supple. No JVD present.  Cardiovascular: Normal rate, regular rhythm, S1 normal and S2 normal.  Murmur heard.  Early systolic murmur is present with a grade of 1/6 at the upper right sternal border. Pulmonary/Chest: Effort normal. She has no rales.  Abdominal: Soft. There is no hepatomegaly.  Musculoskeletal:        General: No edema.  Neurological: She is alert and oriented to person, place, and time.  Skin: Skin is warm and dry.    ASSESSMENT & PLAN:    1. Nonrheumatic aortic valve insufficiency I cannot appreciate a murmur for aortic insufficiency on exam today.  Her last echo in October 2019 demonstrated moderate  aortic insufficiency.  Arrange follow-up echocardiogram.  2. Thoracic aortic aneurysm without rupture ([place]) MRA in 08/2017 demonstrated maximal diameter 4.1 cm.  Consider repeating MRA in 1 to 2 years.  3. Essential hypertension Blood pressure above target today.  She notes significant emotional stress.  Recently, she checked her blood pressure for a week and her readings were optimal.  I have asked her to monitor blood pressure over the next week and send me those readings for review.  If her blood pressure is above goal, I will increase her amlodipine to 5 mg daily.    Dispo:  Return in about 1 year (around 03/20/2020) for Routine Follow Up w/ Dr. Angelena Form, or Richardson Dopp, PA-C, in person.   Medication Adjustments/Labs  and Tests Ordered: Current medicines are reviewed at length with the patient today.  Concerns regarding medicines are outlined above.  Tests Ordered: Orders Placed This Encounter  Procedures  . EKG 12-Lead cs  . ECHOCARDIOGRAM COMPLETE   Medication Changes: No orders of the defined types were placed in this encounter.   Signed, Richardson Dopp, PA-C  03/21/2019 12:42 PM    [facility] [address], [place], [place]  [address] Phone: [phone]; Fax: [phone]

## 2019-03-21 ENCOUNTER — Encounter: Payer: Self-pay | Admitting: Physician Assistant

## 2019-03-21 ENCOUNTER — Other Ambulatory Visit: Payer: Self-pay

## 2019-03-21 ENCOUNTER — Ambulatory Visit: Payer: Medicare Other | Admitting: Physician Assistant

## 2019-03-21 VITALS — BP 154/90 | HR 68 | Ht 69.0 in | Wt 159.0 lb

## 2019-03-21 DIAGNOSIS — I351 Nonrheumatic aortic (valve) insufficiency: Secondary | ICD-10-CM

## 2019-03-21 DIAGNOSIS — I712 Thoracic aortic aneurysm, without rupture, unspecified: Secondary | ICD-10-CM

## 2019-03-21 DIAGNOSIS — I10 Essential (primary) hypertension: Secondary | ICD-10-CM

## 2019-03-21 NOTE — Patient Instructions (Addendum)
.   Medication Instructions:   Your physician recommends that you continue on your current medications as directed. Please refer to the Current Medication list given to you today.  *If you need a refill on your cardiac medications before your next appointment, please call your pharmacy*  Lab Work:  None ordered today  Testing/Procedures:  Your physician has requested that you have an echocardiogram. Echocardiography is a painless test that uses sound waves to create images of your heart. It provides your doctor with information about the size and shape of your heart and how well your heart's chambers and valves are working. This procedure takes approximately one hour. There are no restrictions for this procedure.  Follow-Up: At [facility], you and your health needs are our priority.  As part of our continuing mission to provide you with exceptional heart care, we have created designated Provider Care Teams.  These Care Teams include your primary Cardiologist (physician) and Advanced Practice Providers (APPs -  Physician Assistants and Nurse Practitioners) who all work together to provide you with the care you need, when you need it.  Your next appointment:   12 months  The format for your next appointment:   In Person  Provider:   You may see Lauree Chandler, MD  or one of the following Advanced Practice Providers on your designated Care Team:             Richardson Dopp, PA-C  Dayna Dunn, PA-C  Ermalinda Barrios, PA-C   Other Instructions  Sign up for my chart or call in one week with your blood pressure readings. Take your blood pressure once a day for 1 week.

## 2019-03-28 DIAGNOSIS — R1084 Generalized abdominal pain: Secondary | ICD-10-CM | POA: Diagnosis not present

## 2019-03-31 DIAGNOSIS — I10 Essential (primary) hypertension: Secondary | ICD-10-CM | POA: Diagnosis not present

## 2019-03-31 DIAGNOSIS — M25559 Pain in unspecified hip: Secondary | ICD-10-CM | POA: Diagnosis not present

## 2019-04-01 ENCOUNTER — Other Ambulatory Visit: Payer: Self-pay

## 2019-04-01 ENCOUNTER — Ambulatory Visit (HOSPITAL_COMMUNITY): Payer: Medicare Other | Attending: Cardiology

## 2019-04-01 DIAGNOSIS — I351 Nonrheumatic aortic (valve) insufficiency: Secondary | ICD-10-CM

## 2019-04-03 DIAGNOSIS — M25561 Pain in right knee: Secondary | ICD-10-CM | POA: Diagnosis not present

## 2019-04-03 DIAGNOSIS — R1032 Left lower quadrant pain: Secondary | ICD-10-CM | POA: Diagnosis not present

## 2019-04-15 DIAGNOSIS — M25561 Pain in right knee: Secondary | ICD-10-CM | POA: Diagnosis not present

## 2019-04-17 DIAGNOSIS — M549 Dorsalgia, unspecified: Secondary | ICD-10-CM | POA: Diagnosis not present

## 2019-04-17 DIAGNOSIS — M170 Bilateral primary osteoarthritis of knee: Secondary | ICD-10-CM | POA: Diagnosis not present

## 2019-04-17 DIAGNOSIS — M25559 Pain in unspecified hip: Secondary | ICD-10-CM | POA: Diagnosis not present

## 2019-04-17 DIAGNOSIS — M1180 Other specified crystal arthropathies, unspecified site: Secondary | ICD-10-CM | POA: Diagnosis not present

## 2019-04-17 DIAGNOSIS — M25569 Pain in unspecified knee: Secondary | ICD-10-CM | POA: Diagnosis not present

## 2019-04-21 DIAGNOSIS — M898X9 Other specified disorders of bone, unspecified site: Secondary | ICD-10-CM | POA: Diagnosis not present

## 2019-04-21 DIAGNOSIS — M25561 Pain in right knee: Secondary | ICD-10-CM | POA: Diagnosis not present

## 2019-04-24 DIAGNOSIS — M25561 Pain in right knee: Secondary | ICD-10-CM | POA: Diagnosis not present

## 2019-04-30 DIAGNOSIS — M25561 Pain in right knee: Secondary | ICD-10-CM | POA: Diagnosis not present

## 2019-06-03 ENCOUNTER — Ambulatory Visit: Payer: Medicare Other | Attending: Internal Medicine

## 2019-06-03 DIAGNOSIS — Z23 Encounter for immunization: Secondary | ICD-10-CM | POA: Insufficient documentation

## 2019-06-03 NOTE — Progress Notes (Signed)
   Covid-19 Vaccination Clinic  Name:  Kristine Jones    MRN: [id number] DOB: January 29, 1951  06/03/2019  [name] was observed post Covid-19 immunization for 15 minutes without incidence. She was provided with Vaccine Information Sheet and instruction to access the V-Safe system.   [name] was instructed to call 911 with any severe reactions post vaccine: Marland Kitchen Difficulty breathing  . Swelling of your face and throat  . A fast heartbeat  . A bad rash all over your body  . Dizziness and weakness    Immunizations Administered    Name Date Dose VIS Date Route   Pfizer COVID-19 Vaccine 06/03/2019  3:15 PM 0.3 mL 04/25/2019 Intramuscular   Manufacturer: [employer]   Lot: [id number]   [place]: [id number]

## 2019-06-23 ENCOUNTER — Ambulatory Visit: Payer: Medicare Other | Attending: Internal Medicine

## 2019-06-23 DIAGNOSIS — Z23 Encounter for immunization: Secondary | ICD-10-CM | POA: Insufficient documentation

## 2019-06-23 NOTE — Progress Notes (Signed)
   Covid-19 Vaccination Clinic  Name:  Kristine Jones    MRN: [id number] DOB: Oct 11, 1950  06/23/2019  [name] was observed post Covid-19 immunization for 15 minutes without incidence. She was provided with Vaccine Information Sheet and instruction to access the V-Safe system.   [name] was instructed to call 911 with any severe reactions post vaccine: Marland Kitchen Difficulty breathing  . Swelling of your face and throat  . A fast heartbeat  . A bad rash all over your body  . Dizziness and weakness    Immunizations Administered    Name Date Dose VIS Date Route   Pfizer COVID-19 Vaccine 06/23/2019  5:36 PM 0.3 mL 04/25/2019 Intramuscular   Manufacturer: [place]   Lot: [id number]   [place]: [id number]

## 2019-06-26 ENCOUNTER — Other Ambulatory Visit: Payer: Self-pay | Admitting: Interventional Radiology

## 2019-06-26 DIAGNOSIS — I872 Venous insufficiency (chronic) (peripheral): Secondary | ICD-10-CM

## 2019-07-22 ENCOUNTER — Ambulatory Visit: Payer: Medicare Other

## 2019-07-22 ENCOUNTER — Other Ambulatory Visit: Payer: Medicare Other

## 2019-09-15 ENCOUNTER — Inpatient Hospital Stay (HOSPITAL_COMMUNITY)
Admission: EM | Admit: 2019-09-15 | Discharge: 2019-09-18 | DRG: 446 | Disposition: A | Discharge status: Home / Self Care | Payer: Medicare Other | Attending: Internal Medicine | Admitting: Internal Medicine

## 2019-09-15 ENCOUNTER — Emergency Department (HOSPITAL_COMMUNITY): Payer: Medicare Other

## 2019-09-15 ENCOUNTER — Other Ambulatory Visit: Payer: Self-pay

## 2019-09-15 ENCOUNTER — Encounter (HOSPITAL_COMMUNITY): Payer: Self-pay

## 2019-09-15 DIAGNOSIS — E876 Hypokalemia: Secondary | ICD-10-CM | POA: Diagnosis present

## 2019-09-15 DIAGNOSIS — M8580 Other specified disorders of bone density and structure, unspecified site: Secondary | ICD-10-CM | POA: Diagnosis present

## 2019-09-15 DIAGNOSIS — R001 Bradycardia, unspecified: Secondary | ICD-10-CM | POA: Diagnosis present

## 2019-09-15 DIAGNOSIS — Z801 Family history of malignant neoplasm of trachea, bronchus and lung: Secondary | ICD-10-CM

## 2019-09-15 DIAGNOSIS — Z8249 Family history of ischemic heart disease and other diseases of the circulatory system: Secondary | ICD-10-CM | POA: Diagnosis not present

## 2019-09-15 DIAGNOSIS — Z886 Allergy status to analgesic agent status: Secondary | ICD-10-CM

## 2019-09-15 DIAGNOSIS — K838 Other specified diseases of biliary tract: Secondary | ICD-10-CM | POA: Diagnosis not present

## 2019-09-15 DIAGNOSIS — R1013 Epigastric pain: Secondary | ICD-10-CM

## 2019-09-15 DIAGNOSIS — I712 Thoracic aortic aneurysm, without rupture: Secondary | ICD-10-CM | POA: Diagnosis present

## 2019-09-15 DIAGNOSIS — F419 Anxiety disorder, unspecified: Secondary | ICD-10-CM | POA: Diagnosis present

## 2019-09-15 DIAGNOSIS — Z85820 Personal history of malignant melanoma of skin: Secondary | ICD-10-CM | POA: Diagnosis not present

## 2019-09-15 DIAGNOSIS — Z20822 Contact with and (suspected) exposure to covid-19: Secondary | ICD-10-CM | POA: Diagnosis present

## 2019-09-15 DIAGNOSIS — R748 Abnormal levels of other serum enzymes: Secondary | ICD-10-CM | POA: Diagnosis not present

## 2019-09-15 DIAGNOSIS — F329 Major depressive disorder, single episode, unspecified: Secondary | ICD-10-CM | POA: Diagnosis present

## 2019-09-15 DIAGNOSIS — Z7983 Long term (current) use of bisphosphonates: Secondary | ICD-10-CM | POA: Diagnosis not present

## 2019-09-15 DIAGNOSIS — I10 Essential (primary) hypertension: Secondary | ICD-10-CM | POA: Diagnosis present

## 2019-09-15 DIAGNOSIS — Z86010 Personal history of colonic polyps: Secondary | ICD-10-CM

## 2019-09-15 DIAGNOSIS — I959 Hypotension, unspecified: Secondary | ICD-10-CM | POA: Diagnosis present

## 2019-09-15 DIAGNOSIS — K7689 Other specified diseases of liver: Secondary | ICD-10-CM | POA: Diagnosis present

## 2019-09-15 DIAGNOSIS — K8050 Calculus of bile duct without cholangitis or cholecystitis without obstruction: Secondary | ICD-10-CM | POA: Diagnosis present

## 2019-09-15 DIAGNOSIS — K259 Gastric ulcer, unspecified as acute or chronic, without hemorrhage or perforation: Secondary | ICD-10-CM | POA: Diagnosis present

## 2019-09-15 DIAGNOSIS — D72819 Decreased white blood cell count, unspecified: Secondary | ICD-10-CM | POA: Diagnosis present

## 2019-09-15 DIAGNOSIS — E785 Hyperlipidemia, unspecified: Secondary | ICD-10-CM | POA: Diagnosis present

## 2019-09-15 DIAGNOSIS — Z888 Allergy status to other drugs, medicaments and biological substances status: Secondary | ICD-10-CM

## 2019-09-15 DIAGNOSIS — K229 Disease of esophagus, unspecified: Secondary | ICD-10-CM | POA: Diagnosis present

## 2019-09-15 DIAGNOSIS — E041 Nontoxic single thyroid nodule: Secondary | ICD-10-CM | POA: Diagnosis present

## 2019-09-15 DIAGNOSIS — Z9049 Acquired absence of other specified parts of digestive tract: Secondary | ICD-10-CM

## 2019-09-15 DIAGNOSIS — F32A Depression, unspecified: Secondary | ICD-10-CM | POA: Diagnosis present

## 2019-09-15 DIAGNOSIS — Z79899 Other long term (current) drug therapy: Secondary | ICD-10-CM

## 2019-09-15 DIAGNOSIS — K219 Gastro-esophageal reflux disease without esophagitis: Secondary | ICD-10-CM | POA: Diagnosis present

## 2019-09-15 LAB — CBC WITH DIFFERENTIAL/PLATELET
Abs Immature Granulocytes: 0.01 10*3/uL (ref 0.00–0.07)
Basophils Absolute: 0 10*3/uL (ref 0.0–0.1)
Basophils Relative: 0 %
Eosinophils Absolute: 0.1 10*3/uL (ref 0.0–0.5)
Eosinophils Relative: 1 %
HCT: 40.1 % (ref 36.0–46.0)
Hemoglobin: 12.6 g/dL (ref 12.0–15.0)
Immature Granulocytes: 0 %
Lymphocytes Relative: 17 %
Lymphs Abs: 0.8 10*3/uL (ref 0.7–4.0)
MCH: 29.1 pg (ref 26.0–34.0)
MCHC: 31.4 g/dL (ref 30.0–36.0)
MCV: 92.6 fL (ref 80.0–100.0)
Monocytes Absolute: 0.4 10*3/uL (ref 0.1–1.0)
Monocytes Relative: 8 %
Neutro Abs: 3.6 10*3/uL (ref 1.7–7.7)
Neutrophils Relative %: 74 %
Platelets: 220 10*3/uL (ref 150–400)
RBC: 4.33 MIL/uL (ref 3.87–5.11)
RDW: 12.8 % (ref 11.5–15.5)
WBC: 4.8 10*3/uL (ref 4.0–10.5)
nRBC: 0 % (ref 0.0–0.2)

## 2019-09-15 LAB — TROPONIN I (HIGH SENSITIVITY)
Troponin I (High Sensitivity): 3 ng/L (ref <18)
Troponin I (High Sensitivity): 5 ng/L (ref <18)

## 2019-09-15 LAB — COMPREHENSIVE METABOLIC PANEL
ALT: 64 U/L — ABNORMAL HIGH (ref 0–44)
AST: 164 U/L — ABNORMAL HIGH (ref 15–41)
Albumin: 3.5 g/dL (ref 3.5–5.0)
Alkaline Phosphatase: 93 U/L (ref 38–126)
Anion gap: 8 (ref 5–15)
BUN: 15 mg/dL (ref 8–23)
CO2: 30 mmol/L (ref 22–32)
Calcium: 8.9 mg/dL (ref 8.9–10.3)
Chloride: 101 mmol/L (ref 98–111)
Creatinine, Ser: 0.6 mg/dL (ref 0.44–1.00)
GFR calc Af Amer: >60 mL/min (ref >60)
GFR calc non Af Amer: >60 mL/min (ref >60)
Glucose, Bld: 117 mg/dL — ABNORMAL HIGH (ref 70–99)
Potassium: 3.5 mmol/L (ref 3.5–5.1)
Sodium: 139 mmol/L (ref 135–145)
Total Bilirubin: 1.1 mg/dL (ref 0.3–1.2)
Total Protein: 5.9 g/dL — ABNORMAL LOW (ref 6.5–8.1)

## 2019-09-15 LAB — PROTIME-INR
INR: 1 (ref 0.8–1.2)
Prothrombin Time: 12.8 seconds (ref 11.4–15.2)

## 2019-09-15 LAB — RESPIRATORY PANEL BY RT PCR (FLU A&B, COVID)
Influenza A by PCR: NEGATIVE
Influenza B by PCR: NEGATIVE
SARS Coronavirus 2 by RT PCR: NEGATIVE

## 2019-09-15 LAB — URINALYSIS, ROUTINE W REFLEX MICROSCOPIC
Bilirubin Urine: NEGATIVE
Glucose, UA: NEGATIVE mg/dL
Hgb urine dipstick: NEGATIVE
Ketones, ur: 20 mg/dL — AB
Leukocytes,Ua: NEGATIVE
Nitrite: NEGATIVE
Protein, ur: NEGATIVE mg/dL
Specific Gravity, Urine: 1.026 (ref 1.005–1.030)
pH: 7 (ref 5.0–8.0)

## 2019-09-15 LAB — APTT: aPTT: 25 seconds (ref 24–36)

## 2019-09-15 LAB — MAGNESIUM: Magnesium: 1.8 mg/dL (ref 1.7–2.4)

## 2019-09-15 LAB — LACTIC ACID, PLASMA: Lactic Acid, Venous: 0.8 mmol/L (ref 0.5–1.9)

## 2019-09-15 LAB — LIPASE, BLOOD: Lipase: 26 U/L (ref 11–51)

## 2019-09-15 LAB — TSH: TSH: 2.539 u[IU]/mL (ref 0.350–4.500)

## 2019-09-15 MED ORDER — SERTRALINE HCL 50 MG PO TABS
50.0000 mg | ORAL_TABLET | Freq: Every day | ORAL | Status: DC
  Administered 2019-09-15 – 2019-09-18 (×4): 50 mg via ORAL
  Filled 2019-09-15 (×5): qty 1

## 2019-09-15 MED ORDER — ACETAMINOPHEN 325 MG PO TABS: 650.0000 mg | ORAL_TABLET | Freq: Four times a day (QID) | ORAL | Status: DC | PRN

## 2019-09-15 MED ORDER — ADULT MULTIVITAMIN W/MINERALS CH
1.0000 | ORAL_TABLET | Freq: Every day | ORAL | Status: DC
  Administered 2019-09-15 – 2019-09-18 (×3): 1 via ORAL
  Filled 2019-09-15 (×3): qty 1

## 2019-09-15 MED ORDER — MORPHINE SULFATE (PF) 4 MG/ML IV SOLN
4.0000 mg | Freq: Once | INTRAVENOUS | Status: AC
  Administered 2019-09-15: 15:00:00 4 mg via INTRAVENOUS
  Filled 2019-09-15: qty 1

## 2019-09-15 MED ORDER — ONDANSETRON HCL 4 MG PO TABS: 4.0000 mg | ORAL_TABLET | Freq: Four times a day (QID) | ORAL | Status: DC | PRN

## 2019-09-15 MED ORDER — SODIUM CHLORIDE 0.9 % IV SOLN
2.0000 g | Freq: Once | INTRAVENOUS | Status: AC
  Administered 2019-09-15: 2 g via INTRAVENOUS
  Filled 2019-09-15: qty 20

## 2019-09-15 MED ORDER — OMEGA-3 FATTY ACIDS 1000 MG PO CAPS: 1.0000 g | ORAL_CAPSULE | Freq: Every day | ORAL | Status: DC

## 2019-09-15 MED ORDER — SODIUM CHLORIDE 0.9 % IV BOLUS
1000.0000 mL | Freq: Once | INTRAVENOUS | Status: AC
  Administered 2019-09-15: 1000 mL via INTRAVENOUS

## 2019-09-15 MED ORDER — ALUM & MAG HYDROXIDE-SIMETH 200-200-20 MG/5ML PO SUSP
30.0000 mL | Freq: Once | ORAL | Status: AC
  Administered 2019-09-15: 10:00:00 30 mL via ORAL
  Filled 2019-09-15: qty 30

## 2019-09-15 MED ORDER — ONDANSETRON HCL 4 MG/2ML IJ SOLN: 4.0000 mg | Freq: Four times a day (QID) | INTRAMUSCULAR | Status: DC | PRN

## 2019-09-15 MED ORDER — MORPHINE SULFATE (PF) 2 MG/ML IV SOLN
2.0000 mg | INTRAVENOUS | Status: DC | PRN
  Administered 2019-09-16 (×2): 2 mg via INTRAVENOUS
  Filled 2019-09-15 (×2): qty 1

## 2019-09-15 MED ORDER — VITAMIN D 25 MCG (1000 UNIT) PO TABS
1000.0000 [IU] | ORAL_TABLET | Freq: Every day | ORAL | Status: DC
  Administered 2019-09-15 – 2019-09-18 (×3): 1000 [IU] via ORAL
  Filled 2019-09-15 (×3): qty 1

## 2019-09-15 MED ORDER — SODIUM CHLORIDE 0.9 % IV SOLN: INTRAVENOUS | Status: DC

## 2019-09-15 MED ORDER — CALCIUM CARBONATE-VITAMIN D 500-200 MG-UNIT PO TABS
2.0000 | ORAL_TABLET | Freq: Every day | ORAL | Status: DC
  Administered 2019-09-16 – 2019-09-18 (×2): 2 via ORAL
  Filled 2019-09-15 (×3): qty 2

## 2019-09-15 MED ORDER — IOHEXOL 350 MG/ML SOLN
80.0000 mL | Freq: Once | INTRAVENOUS | Status: AC | PRN
  Administered 2019-09-15: 12:00:00 100 mL via INTRAVENOUS

## 2019-09-15 MED ORDER — LIDOCAINE VISCOUS HCL 2 % MT SOLN
15.0000 mL | Freq: Once | OROMUCOSAL | Status: AC
  Administered 2019-09-15: 10:00:00 15 mL via ORAL
  Filled 2019-09-15: qty 15

## 2019-09-15 MED ORDER — ACETAMINOPHEN 650 MG RE SUPP: 650.0000 mg | Freq: Four times a day (QID) | RECTAL | Status: DC | PRN

## 2019-09-15 NOTE — ED Provider Notes (Signed)
[facility] EMERGENC[facility] DEPARTMENT Provider Note   CSN: [id number] Arrival date & time: 09/15/19  [id number]     History Chief Complaint  Patient presents with  . Abdominal Pain    Kristine Jones is a 69 y.o. female.  The history is provided by the patient and medical records. No language interpreter was used.  Abdominal Pain Pain location:  Epigastric Pain quality: pressure and sharp   Pain radiates to:  Chest Pain severity:  Severe Onset quality:  Sudden Duration:  2 hours Timing:  Constant Progression:  Unchanged Chronicity:  New Context: not trauma   Relieved by:  Nothing Worsened by:  Nothing Ineffective treatments:  None tried Associated symptoms: chest pain and nausea   Associated symptoms: no anorexia, no chills, no constipation, no cough, no diarrhea, no dysuria, no fatigue, no fever, no flatus, no hematuria, no shortness of breath, no vaginal bleeding, no vaginal discharge and no vomiting        Past Medical History:  Diagnosis Date  . Abnormal stress test    a. 09/2008, ST depression on ETT @ [place], no further w/u.  . Adenomatous colon polyp   . Anxiety   . Anxiety and depression   . Aortic insufficiency    a. 06/2012 Echo: EF 55-60%, Gr 1 DD, Mild AI, Triv MR, mildly dil LA, nl RV fxn.  Marland Kitchen History of melanoma    Followed with Dr. Tonia Brooms  . HTN (hypertension)   . Hyperlipidemia   . Leukocytosis    Mild  . Migraines   . Osteopenia   . Varicose veins     Patient Active Problem List   Diagnosis Date Noted  . Leukocytopenia 06/23/2014  . HYPERLIPIDEMIA-MIXED 10/12/2009  . HYPERTENSION, BENIGN 10/12/2009  . AORTIC INSUFFICIENCY 10/12/2009  . ANXIETY DEPRESSION 10/08/2009  . GERD 10/08/2009  . PMS 10/08/2009  . OSTEOPENIA 10/08/2009  . COLONIC POLYPS, ADENOMATOUS, HX OF 10/08/2009  . MIGRAINES, HX OF 10/08/2009    Past Surgical History:  Procedure Laterality Date  . BTL    . CHOLECYSTECTOMY    . DILATION AND CURETTAGE  OF UTERUS    . FEMORAL HERNIA REPAIR    . High endocervical polyp removed    . LIPOMA EXCISION Right 03/23/2015   Procedure: EXCISION LARGE LIPOMA RIGHT UPPER ARM  ;  Surgeon: Youlanda Roys, MD;  Location: [facility];  Service: Plastics;  Laterality: Right;  . MELANOMA EXCISION  2003   right calf  . OVARIAN CYST REMOVAL       OB History   No obstetric history on file.     Family History  Problem Relation Age of Onset  . Hypertension Mother   . Migraines Mother   . Hypertension Father   . Lung cancer Father     Social History   Tobacco Use  . Smoking status: Never Smoker  . Smokeless tobacco: Never Used  Substance Use Topics  . Alcohol use: Yes    Alcohol/week: 1.0 standard drinks    Types: 1 Standard drinks or equivalent per week    Comment: 1 glass of wine per week  . Drug use: No    Home Medications Prior to Admission medications   Medication Sig Start Date End Date Taking? Authorizing Provider  alendronate (FOSAMAX) 70 MG tablet Take 1 tablet (70 mg total) by mouth every 7 (seven) days Take with a full glass of water. Do not lie down for the next 30 min. 03/07/19  [provider]  amLODipine (NORVASC) 2.5 MG tablet Take 2.5 mg by mouth daily.    [provider]  Calcium Carbonate-Vitamin D (CALCIUM-VITAMIN D3) 600-200 MG-UNIT TABS Take 2 tablets by mouth daily.     [provider]  Cholecalciferol (VITAMIN D) 1000 UNITS capsule Take 1,000 Units by mouth daily.     [provider]  estradiol (ESTRACE) 0.1 MG/GM vaginal cream estradiol 0.01% (0.1 mg/gram) vaginal cream    [provider]  fish oil-omega-3 fatty acids 1000 MG capsule Take 1 g by mouth daily.      [provider]  fluticasone (FLONASE) 50 MCG/ACT nasal spray Place 1 spray into both nostrils daily as needed for allergies or rhinitis.    [provider]  multivitamin [facility]) per tablet Take 1 tablet by mouth daily.       [provider]  sertraline (ZOLOFT) 50 MG tablet Take 50 mg by mouth daily.    [provider]    Allergies    Indocin [indomethacin], Ibuprofen, Naproxen, Penicillins, Lisinopril, Naproxen sodium, and Triamterene  Review of Systems   Review of Systems  Constitutional: Positive for diaphoresis. Negative for chills, fatigue and fever.  HENT: Negative for congestion.   Respiratory: Negative for cough, chest tightness, shortness of breath and wheezing.   Cardiovascular: Positive for chest pain. Negative for palpitations and leg swelling.  Gastrointestinal: Positive for abdominal pain and nausea. Negative for anorexia, constipation, diarrhea, flatus and vomiting.  Genitourinary: Negative for dysuria, flank pain, frequency, hematuria, vaginal bleeding and vaginal discharge.  Musculoskeletal: Negative for back pain, neck pain and neck stiffness.  Neurological: Negative for light-headedness and headaches.  Psychiatric/Behavioral: Negative for agitation and confusion.  All other systems reviewed and are negative.   Physical Exam Updated Vital Signs BP 136/63 (BP Location: Right Arm)   Pulse 62   Temp (!) 97.4 F (36.3 C) (Oral)   Resp 17   Ht 5\' 9"  (1.753 m)   Wt 71.7 kg   SpO2 99%   BMI 23.33 kg/m   Physical Exam Vitals and nursing note reviewed.  Constitutional:      General: She is not in acute distress.    Appearance: She is well-developed. She is not ill-appearing, toxic-appearing or diaphoretic.  HENT:     Head: Normocephalic and atraumatic.  Eyes:     General: No scleral icterus.    Conjunctiva/sclera: Conjunctivae normal.  Cardiovascular:     Rate and Rhythm: Regular rhythm. Bradycardia present.     Heart sounds: Murmur present.  Pulmonary:     Effort: Pulmonary effort is normal. No respiratory distress.     Breath sounds: Normal breath sounds. No wheezing, rhonchi or rales.  Chest:     Chest wall: No tenderness.  Abdominal:     General:  Abdomen is flat. Bowel sounds are normal. There is no distension.     Palpations: Abdomen is soft.     Tenderness: There is abdominal tenderness in the epigastric area. There is no right CVA tenderness or left CVA tenderness.  Musculoskeletal:     Cervical back: Neck supple.     Comments: Normal sensation, strength, and pulses in lower extremities.  Symmetric radial pulses in upper extremities.  Skin:    General: Skin is warm and dry.     Capillary Refill: Capillary refill takes less than 2 seconds.     Findings: No rash.  Neurological:     General: No focal deficit present.     Mental Status:  She is alert.  Psychiatric:        Mood and Affect: Mood normal.     ED Results / Procedures / Treatments   Labs (all labs ordered are listed, but only abnormal results are displayed) Labs Reviewed  COMPREHENSIVE METABOLIC PANEL - Abnormal; Notable for the following components:      Result Value   Glucose, Bld 117 (*)    Total Protein 5.9 (*)    AST 164 (*)    ALT 64 (*)    All other components within normal limits  CBC WITH DIFFERENTIAL/PLATELET  LIPASE, BLOOD  LACTIC ACID, PLASMA  MAGNESIUM  TSH  TROPONIN I (HIGH SENSITIVITY)  TROPONIN I (HIGH SENSITIVITY)    EKG EKG Interpretation  Date/Time:  Monday Sep 15 2019 10:04:18 EDT Ventricular Rate:  58 PR Interval:    QRS Duration: 102 QT Interval:  458 QTC Calculation: 450 R Axis:   18 Text Interpretation: Sinus rhythm LVH with secondary repolarization abnormality Anterior infarct, old When compared to prior, slower rate. No STEMI Confirmed by Antony Blackbird [phone]) on 09/15/2019 10:14:38 AM   Radiology CT Angio Chest/Abd/Pel for Dissection W and/or Wo Contrast  Result Date: 09/15/2019 CLINICAL DATA:  Chest pain, low back pain, no thoracic aortic aneurysm repair. EXAM: CT ANGIOGRAPHY CHEST, ABDOMEN AND PELVIS TECHNIQUE: Non-contrast CT of the chest was initially obtained. Multidetector CT imaging through the chest, abdomen and  pelvis was performed using the standard protocol during bolus administration of intravenous contrast. Multiplanar reconstructed images and MIPs were obtained and reviewed to evaluate the vascular anatomy. CONTRAST:  142mL OMNIPAQUE IOHEXOL 350 MG/ML SOLN COMPARISON:  MRI thoracic aorta 09/03/2017 FINDINGS: CTA CHEST FINDINGS Cardiovascular: Preferential opacification of the thoracic aorta. Ascending thoracic aortic aneurysm measuring 4 cm in maximum diameter. The aortic root remains normal in size. Normal 3 vessel are aortic arch anatomy. Normal pulmonary arteries. Normal heart size. No pericardial effusion. Mediastinum/Nodes: No enlarged mediastinal, hilar, or axillary lymph nodes. 2 mm right thyroid hypodense nodule. Trachea is normal. Mild esophageal wall thickening as can be seen with esophagitis. Lungs/Pleura: Lungs are clear. No pleural effusion or pneumothorax. Musculoskeletal: No chest wall abnormality. No acute or significant osseous findings. Review of the MIP images confirms the above findings. CTA ABDOMEN AND PELVIS FINDINGS VASCULAR Aorta: Normal caliber aorta without aneurysm, dissection, vasculitis or significant stenosis. Celiac: Patent without evidence of aneurysm, dissection, vasculitis or significant stenosis. SMA: Patent without evidence of aneurysm, dissection, vasculitis or significant stenosis. Renals: Both renal arteries are patent without evidence of aneurysm, dissection, vasculitis, fibromuscular dysplasia or significant stenosis. IMA: Patent without evidence of aneurysm, dissection, vasculitis or significant stenosis. Inflow: Patent without evidence of aneurysm, dissection, vasculitis or significant stenosis. Scattered mild atherosclerotic plaque. Veins: No obvious venous abnormality within the limitations of this arterial phase study. Review of the MIP images confirms the above findings. NON-VASCULAR Hepatobiliary: 3.9 cm hypodense, fluid attenuating right hepatic mass consistent with a  cyst unchanged compared with MRI of the thoracic aorta dated 09/03/2017. Mild intrahepatic biliary ductal dilatation likely related to a post cholecystectomy state. Gallbladder surgically absent. Pancreas: Unremarkable. No pancreatic ductal dilatation or surrounding inflammatory changes. Spleen: Normal in size without focal abnormality. Adrenals/Urinary Tract: Adrenal glands are unremarkable. Kidneys are normal, without renal calculi, focal lesion, or hydronephrosis. Bladder is unremarkable. Stomach/Bowel: Stomach is within normal limits. Appendix appears normal. No evidence of bowel wall thickening, distention, or inflammatory changes. Lymphatic: No lymphadenopathy. Reproductive: Uterus and bilateral adnexa are unremarkable. Other: No abdominal wall hernia or abnormality.  No abdominopelvic ascites. Musculoskeletal: No acute osseous abnormality. No aggressive osseous lesion. Moderate osteoarthritis of the right hip. Mild osteoarthritis of the left hip. 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 Review of the MIP images confirms the above findings. IMPRESSION: 1. No aortic dissection. 2. Ascending thoracic aortic aneurysm measuring 4 cm in maximum diameter. 3. Mild esophageal wall thickening as can be seen with esophagitis. 4. 2 mm right thyroid nodule. Not clinically significant; no follow-up imaging recommended (ref: J Am Coll Radiol. 2015 Feb;12(2): 143-50). Electronically Signed   By: Kathreen Devoid   On: 09/15/2019 12:58    Procedures Procedures (including critical care time)  Medications Ordered in ED Medications  alum & mag hydroxide-simeth (MAALOX/MYLANTA) 200-200-20 MG/5ML suspension 30 mL (30 mLs Oral Given 09/15/19 1006)    And  lidocaine (XYLOCAINE) 2 % viscous mouth solution 15 mL (15 mLs Oral Given 09/15/19 1006)  iohexol (OMNIPAQUE) 350 MG/ML injection 80 mL (100 mLs Intravenous Contrast Given 09/15/19 1227)  morphine 4 MG/ML injection 4 mg (4 mg Intravenous Given 09/15/19 1506)    sodium chloride 0.9 % bolus 1,000 mL (1,000 mLs Intravenous New Bag/Given 09/15/19 1506)    ED Course  I have reviewed the triage vital signs and the nursing notes.  Pertinent labs & imaging results that were available during my care of the patient were reviewed by me and considered in my medical decision making (see chart for details).    MDM Rules/Calculators/A&P                      Kristine Jones is a 68 y.o. female with a past medical history significant for aortic insufficiency, thoracic aortic aneurysm, hypertension, anxiety, hyperlipidemia, GERD, and migraines who presents with severe chest pain abdominal pain.  Patient reports that on Saturday, 3 days ago, she woke up at 3 AM with discomfort in her upper abdomen and lower chest.  She reports it was sharp and lasted for several hours were going away.  She was unsure if this felt like reflux as she reports she had some pizza for lunch on Friday which is atypical for her.  She says that she did well yesterday but then this morning around 8:30 AM started having the similar discomfort that was worse.  She reports it was 10 on 10 severity and was sudden in onset.  It was very sharp in the epigastrium and her lower chest near her sternum.  She reports that she got diaphoretic with it and some nausea but no vomiting.  She reported no changes in bowel movements or urine changes.  No recent fevers, chills, exertion, or cough.  She denies shortness of breath with it.  It was not exertional or pleuritic.  She reports it did not radiate but was sharp in the central chest and upper abdomen.  She did not take any medications for it and presented for evaluation.  EMS reports that she was slightly hypotensive with blood pressure in the 90s causing her to be given some fluids which improved her blood pressure.  She also was somewhat bradycardic with rates in the 40s that has improved to the 60s on arrival to the emergency department.  On exam, lungs are  clear and chest is nontender on palpation.  She does have some tenderness in the epigastrium but I did not feel a palpating mass.  Bowel sounds were appreciated.  No tenderness in the back or CVA areas.  Murmur appreciated.  Symmetric  pulses in her radial arteries and her lower extremities.  Normal sensation and strength in legs.  Given the history of aortic aneurysm with this sudden onset extremely sharp pain in her lower chest and epigastrium that is 10 out of 10 in severity, I do feel the patient needs imaging to rule out an aortic etiology discomfort.  We discussed that this is hopefully just atypical GERD and she will be given a GI cocktail but she will also have work-up to rule out a cardiac etiology with troponins and other labs.  Patient wanted to wait on a chest x-ray and instead get the CT scan which we had a shared decision-making conversation to agree to obtain.  Patient's husband is also in agreement with his work-up plan.  Blood pressure is not elevated on arrival and has improved after the fluids.  I have a lower suspicion for aortic etiology and suspect this is reflux but given her history and the description of her symptoms, I do feel that she needs the CT scan.  Anticipate reassessment after work-up.       CT scan showed no evidence of dissection and no significant change in her aorta.  A small thyroid nodule was seen and patient was informed of.  Also there was some esophageal thickening which may represent esophagitis which may explain the patient's discomfort.  There was however also some bile duct dilation.  Review of the patient's labs show that LFTs are elevated compared to prior despite prior cholecystectomy.  Patient was reassessed and her pain worsened when she tried to eat.  This could still be reflux however, we discussed getting ultrasound to look for a stone in the biliary tract that may be causing symptoms.  Plan of care is to now give the patient some pain medicine, fluids  for rehydration, and get the ultrasound of the abdomen.  If ultrasound is reassuring and patient is feeling better, dissipate discharge to follow-up with outpatient GI and start Prilosec for likely gastritis/esophagitis.  If significant abnormalities are discovered on ultrasound, anticipate shared decision-making conversation to determine disposition.   Final Clinical Impression(s) / ED Diagnoses Final diagnoses:  Epigastric abdominal pain    Clinical Impression: 1. Epigastric abdominal pain     Disposition: Care transferred to Dr. Rex Kras while awaiting results of right upper quadrant ultrasound and reassessment after pain medicine.  Suspect esophagitis/gastritis as primary cause of symptoms however ultrasound will look for other biliary abnormalities with LFT elevation.  Anticipate discharge if she is feeling better and work-up is still reassuring.  This note was prepared with assistance of Systems analyst. Occasional wrong-word or sound-a-like substitutions may have occurred due to the inherent limitations of voice recognition software.     Shandricka Monroy, Gwenyth Allegra, MD 09/15/19 1534

## 2019-09-15 NOTE — H&P (Signed)
History and Physical    Kristine Jones [id number] DOB: 1950-07-04 DOA: 09/15/2019  PCP: Deland Pretty, MD  Patient coming from: Home  I have personally briefly reviewed patient's old medical records in [place]  Chief Complaint: Epigastric pain since 2 days  HPI: Kristine Jones is a 69 y.o. female with medical history significant of hypertension, anxiety/depression, osteopenia, ascending thoracic aortic aneurysm, GERD presents to emergency department due to severe abdominal pain.  Patient tells me that she started having severe abdominal pain associated with vomiting on Saturday which resolved however this morning she woke up at 3 AM with discomfort in epigastric region.  Patient tells me that her pain was severe in intensity, nonradiating, sharp in nature, no relieving factors therefore she came to ER for further evaluation and management.  EMS reports that patient was slightly hypotensive with blood pressure in 90s, IV fluids was given in route to ED.  She was also somewhat bradycardic with rates in 40s that improved to 60s on arrival to ED.  Reports association with nausea, chills and decreased appetite.  Denies fever, pruritus, jaundice, headache, blurry vision, chest pain, shortness of breath, palpitation, urinary symptoms such as dysuria, change in urinary frequency, hematuria, lower back pain, melena or hematemesis.  She lives with her husband at home.  No history of smoking, alcohol, illicit drug use.  ED Course: Upon arrival to ED: Patient's heart rate in 50s, lowest blood pressure noted was 92/73, initial labs such as CBC, lipase, TSH, lactic acid, magnesium level, troponin x2 all came back within normal limit.  CMP shows AST of 164, ALT of 64, COVID-19 pending. CTA chest obtained which shows no aortic dissection.  Ascending thoracic aortic aneurysm measuring 4 cm in diameter.  Mild esophageal wall thickening can be seen with esophagitis.  2 mm right thyroid nodule  not clinically significant, no follow-up imaging recommended.  Ultrasound of right upper quadrant shows 5.6 mm stone in the distal CBD.  CBD is dilated to a diameter of 10.3 mm.  Patient received IV fluid bolus, morphine, Maalox, Rocephin in the ED.  EDP consulted GI.  [facility] hospitalist consulted for admission.  Review of Systems: As per HPI otherwise negative.    Past Medical History:  Diagnosis Date  . Abnormal stress test    a. 09/2008, ST depression on ETT @ [place], no further w/u.  . Adenomatous colon polyp   . Anxiety   . Anxiety and depression   . Aortic insufficiency    a. 06/2012 Echo: EF 55-60%, Gr 1 DD, Mild AI, Triv MR, mildly dil LA, nl RV fxn.  Marland Kitchen History of melanoma    Followed with Dr. Tonia Brooms  . HTN (hypertension)   . Hyperlipidemia   . Leukocytosis    Mild  . Migraines   . Osteopenia   . Varicose veins     Past Surgical History:  Procedure Laterality Date  . BTL    . CHOLECYSTECTOMY    . DILATION AND CURETTAGE OF UTERUS    . FEMORAL HERNIA REPAIR    . High endocervical polyp removed    . LIPOMA EXCISION Right 03/23/2015   Procedure: EXCISION LARGE LIPOMA RIGHT UPPER ARM  ;  Surgeon: Youlanda Roys, MD;  Location: [facility];  Service: Plastics;  Laterality: Right;  . MELANOMA EXCISION  2003   right calf  . OVARIAN CYST REMOVAL       reports that she has never smoked. She has never used  smokeless tobacco. She reports current alcohol use of about 1.0 standard drinks of alcohol per week. She reports that she does not use drugs.  Allergies  Allergen Reactions  . Indocin [Indomethacin] Other (See Comments)    Stomach upset  . Ibuprofen Other (See Comments)    Grogginess  . Naproxen Other (See Comments)    CAUSE GI [place] OR HAZZY  . Penicillins Other (See Comments)    "flu like symptoms"  . Lisinopril Other (See Comments)  . Naproxen Sodium Other (See Comments)  . Triamterene Other (See Comments)    False gout     Family History  Problem Relation Age of Onset  . Hypertension Mother   . Migraines Mother   . Hypertension Father   . Lung cancer Father     Prior to Admission medications   Medication Sig Start Date End Date Taking? Authorizing Provider  amLODipine (NORVASC) 2.5 MG tablet Take 2.5 mg by mouth daily.   Yes [provider]  Calcium Carbonate-Vitamin D (CALCIUM-VITAMIN D3) 600-200 MG-UNIT TABS Take 2 tablets by mouth daily.    Yes [provider]  Cholecalciferol (VITAMIN D) 1000 UNITS capsule Take 1,000 Units by mouth daily.    Yes [provider]  estradiol (ESTRACE) 0.1 MG/GM vaginal cream Place 1 Applicatorful vaginally as needed (vaginal discomfort).    Yes [provider]  fish oil-omega-3 fatty acids 1000 MG capsule Take 1 g by mouth daily.     Yes [provider]  fluticasone (FLONASE) 50 MCG/ACT nasal spray Place 1 spray into both nostrils daily as needed for allergies or rhinitis.   Yes [provider]  multivitamin [facility]) per tablet Take 1 tablet by mouth daily.     Yes [provider]  sertraline (ZOLOFT) 50 MG tablet Take 50 mg by mouth daily.   Yes [provider]  alendronate (FOSAMAX) 70 MG tablet Take 70 mg by mouth once a week.  03/07/19   [provider]  ibandronate (BONIVA) 150 MG tablet Take 150 mg by mouth every 30 (thirty) days. 07/14/19   [provider]  risedronate (ACTONEL) 35 MG tablet Take 35 mg by mouth once a week. 09/05/19   [provider]    Physical Exam: Vitals:   09/15/19 1700 09/15/19 1730 09/15/19 1745 09/15/19 1800  BP: (!) 146/71 (!) 142/70  (!) 148/72  Pulse: 64 74 70 67  Resp: 20 14 (!) 22 15  Temp:      TempSrc:      SpO2: 94% 96% 95% 94%  Weight:      Height:        Constitutional: NAD, calm, comfortable, on room air, communicating well.   Eyes: PERRL, lids and conjunctivae normal ENMT: Mucous membranes are moist. Posterior  pharynx clear of any exudate or lesions.Normal dentition.  Neck: normal, supple, no masses, no thyromegaly Respiratory: clear to auscultation bilaterally, no wheezing, no crackles. Normal respiratory effort. No accessory muscle use.  Cardiovascular: Regular rate and rhythm, no murmurs / rubs / gallops. No extremity edema. 2+ pedal pulses. No carotid bruits.  Abdomen: Abdomen soft, mild epigastric tenderness positive, no guarding, no rigidity, no masses palpated. No hepatosplenomegaly. Bowel sounds positive.  Musculoskeletal: no clubbing / cyanosis. No joint deformity upper and lower extremities. Good ROM, no contractures. Normal muscle tone.  Skin: no rashes, lesions, ulcers. No induration Neurologic: CN 2-12 grossly intact. Sensation intact, DTR normal. Strength 5/5 in all 4.  Psychiatric: Normal judgment and insight. Alert and oriented  x 3. Normal mood.    Labs on Admission: I have personally reviewed following labs and imaging studies  CBC: Recent Labs  Lab 09/15/19 0951  WBC 4.8  NEUTROABS 3.6  HGB 12.6  HCT 40.1  MCV 92.6  PLT [id number]   Basic Metabolic Panel: Recent Labs  Lab 09/15/19 0951  NA 139  K 3.5  CL 101  CO2 30  GLUCOSE 117*  BUN 15  CREATININE 0.60  CALCIUM 8.9  MG 1.8   GFR: Estimated Creatinine Clearance: 69.4 mL/min (by C-G formula based on SCr of 0.6 mg/dL). Liver Function Tests: Recent Labs  Lab 09/15/19 0951  AST 164*  ALT 64*  ALKPHOS 93  BILITOT 1.1  PROT 5.9*  ALBUMIN 3.5   Recent Labs  Lab 09/15/19 0951  LIPASE 26   No results for input(s): AMMONIA in the last 168 hours. Coagulation Profile: No results for input(s): INR, PROTIME in the last 168 hours. Cardiac Enzymes: No results for input(s): CKTOTAL, CKMB, CKMBINDEX, TROPONINI in the last 168 hours. BNP (last 3 results) No results for input(s): PROBNP in the last 8760 hours. HbA1C: No results for input(s): HGBA1C in the last 72 hours. CBG: No results for input(s): GLUCAP in the  last 168 hours. Lipid Profile: No results for input(s): CHOL, HDL, LDLCALC, TRIG, CHOLHDL, LDLDIRECT in the last 72 hours. Thyroid Function Tests: Recent Labs    09/15/19 1004  TSH 2.539   Anemia Panel: No results for input(s): VITAMINB12, FOLATE, FERRITIN, TIBC, IRON, RETICCTPCT in the last 72 hours. Urine analysis: No results found for: COLORURINE, APPEARANCEUR, LABSPEC, PHURINE, GLUCOSEU, HGBUR, BILIRUBINUR, KETONESUR, PROTEINUR, UROBILINOGEN, NITRITE, LEUKOCYTESUR  Radiological Exams on Admission: CT Angio Chest/Abd/Pel for Dissection W and/or Wo Contrast  Result Date: 09/15/2019 CLINICAL DATA:  Chest pain, low back pain, no thoracic aortic aneurysm repair. EXAM: CT ANGIOGRAPHY CHEST, ABDOMEN AND PELVIS TECHNIQUE: Non-contrast CT of the chest was initially obtained. Multidetector CT imaging through the chest, abdomen and pelvis was performed using the standard protocol during bolus administration of intravenous contrast. Multiplanar reconstructed images and MIPs were obtained and reviewed to evaluate the vascular anatomy. CONTRAST:  162mL OMNIPAQUE IOHEXOL 350 MG/ML SOLN COMPARISON:  MRI thoracic aorta 09/03/2017 FINDINGS: CTA CHEST FINDINGS Cardiovascular: Preferential opacification of the thoracic aorta. Ascending thoracic aortic aneurysm measuring 4 cm in maximum diameter. The aortic root remains normal in size. Normal 3 vessel are aortic arch anatomy. Normal pulmonary arteries. Normal heart size. No pericardial effusion. Mediastinum/Nodes: No enlarged mediastinal, hilar, or axillary lymph nodes. 2 mm right thyroid hypodense nodule. Trachea is normal. Mild esophageal wall thickening as can be seen with esophagitis. Lungs/Pleura: Lungs are clear. No pleural effusion or pneumothorax. Musculoskeletal: No chest wall abnormality. No acute or significant osseous findings. Review of the MIP images confirms the above findings. CTA ABDOMEN AND PELVIS FINDINGS VASCULAR Aorta: Normal caliber aorta  without aneurysm, dissection, vasculitis or significant stenosis. Celiac: Patent without evidence of aneurysm, dissection, vasculitis or significant stenosis. SMA: Patent without evidence of aneurysm, dissection, vasculitis or significant stenosis. Renals: Both renal arteries are patent without evidence of aneurysm, dissection, vasculitis, fibromuscular dysplasia or significant stenosis. IMA: Patent without evidence of aneurysm, dissection, vasculitis or significant stenosis. Inflow: Patent without evidence of aneurysm, dissection, vasculitis or significant stenosis. Scattered mild atherosclerotic plaque. Veins: No obvious venous abnormality within the limitations of this arterial phase study. Review of the MIP images confirms the above findings. NON-VASCULAR Hepatobiliary: 3.9 cm hypodense, fluid attenuating right hepatic mass consistent with a cyst unchanged compared  with MRI of the thoracic aorta dated 09/03/2017. Mild intrahepatic biliary ductal dilatation likely related to a post cholecystectomy state. Gallbladder surgically absent. Pancreas: Unremarkable. No pancreatic ductal dilatation or surrounding inflammatory changes. Spleen: Normal in size without focal abnormality. Adrenals/Urinary Tract: Adrenal glands are unremarkable. Kidneys are normal, without renal calculi, focal lesion, or hydronephrosis. Bladder is unremarkable. Stomach/Bowel: Stomach is within normal limits. Appendix appears normal. No evidence of bowel wall thickening, distention, or inflammatory changes. Lymphatic: No lymphadenopathy. Reproductive: Uterus and bilateral adnexa are unremarkable. Other: No abdominal wall hernia or abnormality. No abdominopelvic ascites. Musculoskeletal: No acute osseous abnormality. No aggressive osseous lesion. Moderate osteoarthritis of the right hip. Mild osteoarthritis of the left hip. 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 Review of the MIP images confirms the above findings.  IMPRESSION: 1. No aortic dissection. 2. Ascending thoracic aortic aneurysm measuring 4 cm in maximum diameter. 3. Mild esophageal wall thickening as can be seen with esophagitis. 4. 2 mm right thyroid nodule. Not clinically significant; no follow-up imaging recommended (ref: J Am Coll Radiol. 2015 Feb;12(2): 143-50). Electronically Signed   By: Kathreen Devoid   On: 09/15/2019 12:58   US Abdomen Limited RUQ  Result Date: 09/15/2019 CLINICAL DATA:  Epigastric pain.  Previous cholecystectomy. EXAM: ULTRASOUND ABDOMEN LIMITED RIGHT UPPER QUADRANT COMPARISON:  CT scan dated 09/15/2019 FINDINGS: Gallbladder: Surgically absent. Common bile duct: Diameter: There is a 5.6 mm stone in the distal common bile duct. Common bile duct is dilated to a diameter 10.3 mm. Liver: 4.3 cm simple cyst in the anterior aspect of the right lobe of the liver. No other focal liver lesions. Within normal limits in parenchymal echogenicity. Portal vein is patent on color Doppler imaging with normal direction of blood flow towards the liver. Other: None. IMPRESSION: 5.6 mm stone in the distal common bile duct. Common bile duct is dilated to a diameter of 10.3 mm. Electronically Signed   By: Lorriane Shire M.D.   On: 09/15/2019 16:07    EKG: Independently reviewed.  Sinus rhythm, LVH, no ST elevation or depression noted.  Assessment/Plan Principal Problem:   Choledocholithiasis Active Problems:   HYPERTENSION, BENIGN   Anxiety and depression   Elevated liver enzymes   Osteopenia    Choledocholithiasis: -Patient presented with severe epigastric pain, nausea and chills. -CMP shows elevated liver enzymes AST: 164, ALT: 64. -Afebrile with no leukocytosis.  Lipase, lactic acid, troponin x2: WNL.  COVID-19 pending. -Received IV fluid bolus, morphine, Rocephin, Maalox in ED. -CT angio of chest is negative for aortic dissection.  Reviewed right upper quadrant ultrasound result. -Admit patient on the floor for close  monitoring. -Check PT/INR and acute hepatitis panel -Morphine as needed for pain control.  Zofran as needed for nausea and vomiting. -We will keep her n.p.o. -EDP consulted GI-await recommendations. -Monitor vitals closely.  Hypertension: Blood pressure is on lower side. (Lowest blood pressure: 92/73) - hold amlodipine for now -Start on gentle hydration and monitor blood pressure closely.  Aortic regurgitation: -Reviewed echo from 11/20.  Preserved ejection fraction.  No CHF.  Ascending thoracic aortic aneurysm: -Noted on CTA.  No dissection  Thyroid nodule: Noted on CTA -TSH: WNL. -No follow-up imaging recommended.  Anxiety/depression: Continue Zoloft  Osteopenia: Continue home meds  DVT prophylaxis: TED/SCD Code Status: Full code Family Communication: Patient's husband present at bedside.  Plan of care discussed with patient and her husband in length and they verbalized understanding and agreed with it. Disposition Plan: Likely home in 2-3 days  Consults called: GI by EDP Admission status: Inpatient  Mckinley Jewel MD [facility]  If 7PM-7AM, please contact night-coverage [email or web address] Password [facility]  09/15/2019, 6:43 PM

## 2019-09-15 NOTE — ED Notes (Signed)
Attempted report 

## 2019-09-15 NOTE — Progress Notes (Signed)
Received patient from Ed via wheelchair, AOx4, ambulatory, VSS, pain at 1/10, oriented to room, bed control, call light and plan of care. Patient now sitting on bed watching TV and will sign consent form for procedure after talking to the GI doctor.

## 2019-09-15 NOTE — ED Notes (Signed)
Urine culture sent with u/a °

## 2019-09-15 NOTE — ED Triage Notes (Signed)
Pt from home with ems c.o upper abd pain that started this morning around 830am. Pt denies any other symptoms. BP 99/50 HR45, pt given 576ml saline, BP 110/50. Pt a.o, nad noted

## 2019-09-16 LAB — CBC
HCT: 40.4 % (ref 36.0–46.0)
Hemoglobin: 12.6 g/dL (ref 12.0–15.0)
MCH: 28.6 pg (ref 26.0–34.0)
MCHC: 31.2 g/dL (ref 30.0–36.0)
MCV: 91.6 fL (ref 80.0–100.0)
Platelets: 227 10*3/uL (ref 150–400)
RBC: 4.41 MIL/uL (ref 3.87–5.11)
RDW: 13.1 % (ref 11.5–15.5)
WBC: 2.3 10*3/uL — ABNORMAL LOW (ref 4.0–10.5)
nRBC: 0 % (ref 0.0–0.2)

## 2019-09-16 LAB — COMPREHENSIVE METABOLIC PANEL
ALT: 331 U/L — ABNORMAL HIGH (ref 0–44)
AST: 425 U/L — ABNORMAL HIGH (ref 15–41)
Albumin: 3.5 g/dL (ref 3.5–5.0)
Alkaline Phosphatase: 173 U/L — ABNORMAL HIGH (ref 38–126)
Anion gap: 7 (ref 5–15)
BUN: 10 mg/dL (ref 8–23)
CO2: 26 mmol/L (ref 22–32)
Calcium: 9 mg/dL (ref 8.9–10.3)
Chloride: 111 mmol/L (ref 98–111)
Creatinine, Ser: 0.61 mg/dL (ref 0.44–1.00)
GFR calc Af Amer: >60 mL/min (ref >60)
GFR calc non Af Amer: >60 mL/min (ref >60)
Glucose, Bld: 103 mg/dL — ABNORMAL HIGH (ref 70–99)
Potassium: 3.3 mmol/L — ABNORMAL LOW (ref 3.5–5.1)
Sodium: 144 mmol/L (ref 135–145)
Total Bilirubin: 2.8 mg/dL — ABNORMAL HIGH (ref 0.3–1.2)
Total Protein: 6.2 g/dL — ABNORMAL LOW (ref 6.5–8.1)

## 2019-09-16 LAB — HEPATITIS PANEL, ACUTE
HCV Ab: NONREACTIVE
Hep A IgM: NONREACTIVE
Hep B C IgM: NONREACTIVE
Hepatitis B Surface Ag: NONREACTIVE

## 2019-09-16 LAB — HIV ANTIBODY (ROUTINE TESTING W REFLEX): HIV Screen 4th Generation wRfx: NONREACTIVE

## 2019-09-16 MED ORDER — AMLODIPINE BESYLATE 2.5 MG PO TABS
2.5000 mg | ORAL_TABLET | Freq: Every day | ORAL | Status: DC
  Administered 2019-09-16 – 2019-09-18 (×3): 2.5 mg via ORAL
  Filled 2019-09-16 (×3): qty 1

## 2019-09-16 MED ORDER — FLUTICASONE PROPIONATE 50 MCG/ACT NA SUSP
1.0000 | Freq: Every day | NASAL | Status: DC | PRN
  Filled 2019-09-16: qty 16

## 2019-09-16 NOTE — ED Provider Notes (Signed)
I received pt in signout from Dr. Sherry Ruffing. A time of signout, pending RUQ [place]  To evaluate CBD.   IMPRESSION:  5.6 mm stone in the distal common bile duct. Common bile duct is  dilated to a diameter of 10.3 mm.    Contacted gastroenterology and discussed w/ Dr. Therisa Doyne. They will see pt in consultation and agreed w/ plan for antibiotics now. Discussed admission w/ [facility] hospitalist, Dr. Nevada Crane. Discussed treatment plan w/ patient and family.    Macaria Bias, Wenda Overland, MD 09/16/19 [phone]

## 2019-09-16 NOTE — Progress Notes (Signed)
Patient was scheduled for ERCP at 1230 today. Due to emergent add-ons for Dr. Therisa Doyne at another campus, ERCP moved to 09/17/19. Verbal orders from Dr. Therisa Doyne for patient to have clear liquid diet today and then NPO at midnight. Primary RN and patient notified of this change.

## 2019-09-16 NOTE — Progress Notes (Signed)
PROGRESS NOTE  Kristine Jones [id number] DOB: [birth year] DOA: 09/15/2019 PCP: Deland Pretty, MD  HPI/Recap of past 24 hours: HPI: Kristine Jones is a 69 y.o. female with medical history significant of hypertension, anxiety/depression, osteopenia, ascending thoracic aortic aneurysm, GERD presents to emergency department due to severe abdominal pain.  Not associated with nausea or vomiting. Found to have choledocholithiasis, on imaging.  She had a cholecystectomy done 10 years ago.  Seen by GI with plan for ERCP 09/17/19.  She is afebrile with no leukocytosis.  Elevated AST and ALT.  09/16/19: Seen and examined.  Abdominal pain is improved.  States she feels better after taking her pain medication, IV morphine, this morning.  She denies any nausea.  Seen by GI with plan as stated above.  Appreciate GI's assistance.  Assessment/Plan: Principal Problem:   Choledocholithiasis Active Problems:   HYPERTENSION, BENIGN   Anxiety and depression   Elevated liver enzymes   Osteopenia   Severe abdominal pain secondary to choledocholithiasis Post cholecystectomy >10 years ago Seen on CT scan Plan for ERCP on 09/17/2019 Continue pain management Continue clear liquid diet and n.p.o. after midnight Monitor vital signs and electrolytes  Elevated LFTs in the setting of choledocholithiasis Elevated AST and ALT Alkaline phosphatase and T bili levels are normal Repeat CMP is pending at the time of this dictation  Leukopenia, likely in the setting of acute illness WBC 2.3 K from 4.8 K Monitor and repeat CBC in the morning  Essential hypertension Blood pressure is stable Resume home amlodipine 2.5 mg daily Continue to monitor vital signs  Hypokalemia Potassium 3.5 Repeated labs are still pending this morning Goal potassium 4.0.  Ascending thoracic aortic aneurysm 4 cm Recommend surveillance outpatient  Thyroid nodule on CT TSH normal Surveillance outpatient  Chronic  anxiety/depression Continue Zoloft  Osteopenia Continue home medications    DVT prophylaxis: TED/SCD Code Status: Full code Family Communication:  Will call family if okay with the patient   Disposition Plan:  Patient is from home.  Anticipate discharge to home in the next 24 to 48 hours or when GI signs off.  Consults called: GI     Objective: Vitals:   09/15/19 2149 09/16/19 0149 09/16/19 0442 09/16/19 0456  BP: (!) 156/84 (!) 156/68 (!) 145/60 (!) 145/60  Pulse: 69 64 64 64  Resp: 17 16 16 17   Temp: 98.5 F (36.9 C) 98.2 F (36.8 C) 98 F (36.7 C) 98.1 F (36.7 C)  TempSrc: Oral Oral  Oral  SpO2: 97% 97% 97% 97%  Weight:    76.1 kg  Height:        Intake/Output Summary (Last 24 hours) at 09/16/2019 1017 Last data filed at 09/16/2019 0500 Gross per 24 hour  Intake 2205.46 ml  Output --  Net 2205.46 ml   Filed Weights   09/15/19 0935 09/16/19 0456  Weight: 71.7 kg 76.1 kg    Exam:  . General: 69 y.o. year-old female well developed well nourished in no acute distress.  Alert and oriented x3. . Cardiovascular: Regular rate and rhythm with no rubs or gallops.  No thyromegaly or JVD noted.   Marland Kitchen Respiratory: Clear to auscultation with no wheezes or rales. Good inspiratory effort. . Abdomen: Soft nontender nondistended with normal bowel sounds x4 quadrants. . Musculoskeletal: No lower extremity edema. 2/4 pulses in all 4 extremities. Marland Kitchen Psychiatry: Mood is appropriate for condition and setting   Data Reviewed: CBC: Recent Labs  Lab 09/15/19 0951 09/16/19 0902  WBC 4.8  2.3*  NEUTROABS 3.6  --   HGB 12.6 12.6  HCT 40.1 40.4  MCV 92.6 91.6  PLT 220 [id number]   Basic Metabolic Panel: Recent Labs  Lab 09/15/19 0951  NA 139  K 3.5  CL 101  CO2 30  GLUCOSE 117*  BUN 15  CREATININE 0.60  CALCIUM 8.9  MG 1.8   GFR: Estimated Creatinine Clearance: 69.4 mL/min (by C-G formula based on SCr of 0.6 mg/dL). Liver Function Tests: Recent Labs  Lab 09/15/19 0951   AST 164*  ALT 64*  ALKPHOS 93  BILITOT 1.1  PROT 5.9*  ALBUMIN 3.5   Recent Labs  Lab 09/15/19 0951  LIPASE 26   No results for input(s): AMMONIA in the last 168 hours. Coagulation Profile: Recent Labs  Lab 09/15/19 2000  INR 1.0   Cardiac Enzymes: No results for input(s): CKTOTAL, CKMB, CKMBINDEX, TROPONINI in the last 168 hours. BNP (last 3 results) No results for input(s): PROBNP in the last 8760 hours. HbA1C: No results for input(s): HGBA1C in the last 72 hours. CBG: No results for input(s): GLUCAP in the last 168 hours. Lipid Profile: No results for input(s): CHOL, HDL, LDLCALC, TRIG, CHOLHDL, LDLDIRECT in the last 72 hours. Thyroid Function Tests: Recent Labs    09/15/19 1004  TSH 2.539   Anemia Panel: No results for input(s): VITAMINB12, FOLATE, FERRITIN, TIBC, IRON, RETICCTPCT in the last 72 hours. Urine analysis:    Component Value Date/Time   COLORURINE AMBER (A) 09/15/2019 2115   APPEARANCEUR CLEAR 09/15/2019 2115   LABSPEC 1.026 09/15/2019 2115   PHURINE 7.0 09/15/2019 2115   GLUCOSEU NEGATIVE 09/15/2019 2115   HGBUR NEGATIVE 09/15/2019 2115   BILIRUBINUR NEGATIVE 09/15/2019 2115   KETONESUR 20 (A) 09/15/2019 2115   PROTEINUR NEGATIVE 09/15/2019 2115   NITRITE NEGATIVE 09/15/2019 2115   LEUKOCYTESUR NEGATIVE 09/15/2019 2115   Sepsis Labs: @LABRCNTIP (procalcitonin:4,lacticidven:4)  ) Recent Results (from the past 240 hour(s))  Respiratory Panel by RT PCR (Flu A&B, Covid) - Nasopharyngeal Swab     Status: None   Collection Time: 09/15/19  5:25 PM   Specimen: Nasopharyngeal Swab  Result Value Ref Range Status   SARS Coronavirus 2 by RT PCR NEGATIVE NEGATIVE Final    Comment: (NOTE) SARS-CoV-2 target nucleic acids are NOT DETECTED. The SARS-CoV-2 RNA is generally detectable in upper respiratoy specimens during the acute phase of infection. The lowest concentration of SARS-CoV-2 viral copies this assay can detect is 131 copies/mL. A negative  result does not preclude SARS-Cov-2 infection and should not be used as the sole basis for treatment or other patient management decisions. A negative result may occur with  improper specimen collection/handling, submission of specimen other than nasopharyngeal swab, presence of viral mutation(s) within the areas targeted by this assay, and inadequate number of viral copies (<131 copies/mL). A negative result must be combined with clinical observations, patient history, and epidemiological information. The expected result is Negative. Fact Sheet for Patients:  PinkCheek.be Fact Sheet for Healthcare Providers:  GravelBags.it This test is not yet ap proved or cleared by the [place] FDA and  has been authorized for detection and/or diagnosis of SARS-CoV-2 by FDA under an Emergency Use Authorization (EUA). This EUA will remain  in effect (meaning this test can be used) for the duration of the COVID-19 declaration under Section 564(b)(1) of the Act, 21 U.S.C. section 360bbb-3(b)(1), unless the authorization is terminated or revoked sooner.    Influenza A by PCR NEGATIVE NEGATIVE Final   Influenza B  by PCR NEGATIVE NEGATIVE Final    Comment: (NOTE) The Xpert Xpress SARS-CoV-2/FLU/RSV assay is intended as an aid in  the diagnosis of influenza from Nasopharyngeal swab specimens and  should not be used as a sole basis for treatment. Nasal washings and  aspirates are unacceptable for Xpert Xpress SARS-CoV-2/FLU/RSV  testing. Fact Sheet for Patients: PinkCheek.be Fact Sheet for Healthcare Providers: GravelBags.it This test is not yet approved or cleared by the [place] FDA and  has been authorized for detection and/or diagnosis of SARS-CoV-2 by  FDA under an Emergency Use Authorization (EUA). This EUA will remain  in effect (meaning this test can be used) for the  duration of the  Covid-19 declaration under Section 564(b)(1) of the Act, 21  U.S.C. section 360bbb-3(b)(1), unless the authorization is  terminated or revoked. Performed at [facility], [place] [address], [place], [place] [address]       Studies: CT Angio Chest/Abd/Pel for Dissection W and/or Wo Contrast  Result Date: 09/15/2019 CLINICAL DATA:  Chest pain, low back pain, no thoracic aortic aneurysm repair. EXAM: CT ANGIOGRAPHY CHEST, ABDOMEN AND PELVIS TECHNIQUE: Non-contrast CT of the chest was initially obtained. Multidetector CT imaging through the chest, abdomen and pelvis was performed using the standard protocol during bolus administration of intravenous contrast. Multiplanar reconstructed images and MIPs were obtained and reviewed to evaluate the vascular anatomy. CONTRAST:  165mL OMNIPAQUE IOHEXOL 350 MG/ML SOLN COMPARISON:  MRI thoracic aorta 09/03/2017 FINDINGS: CTA CHEST FINDINGS Cardiovascular: Preferential opacification of the thoracic aorta. Ascending thoracic aortic aneurysm measuring 4 cm in maximum diameter. The aortic root remains normal in size. Normal 3 vessel are aortic arch anatomy. Normal pulmonary arteries. Normal heart size. No pericardial effusion. Mediastinum/Nodes: No enlarged mediastinal, hilar, or axillary lymph nodes. 2 mm right thyroid hypodense nodule. Trachea is normal. Mild esophageal wall thickening as can be seen with esophagitis. Lungs/Pleura: Lungs are clear. No pleural effusion or pneumothorax. Musculoskeletal: No chest wall abnormality. No acute or significant osseous findings. Review of the MIP images confirms the above findings. CTA ABDOMEN AND PELVIS FINDINGS VASCULAR Aorta: Normal caliber aorta without aneurysm, dissection, vasculitis or significant stenosis. Celiac: Patent without evidence of aneurysm, dissection, vasculitis or significant stenosis. SMA: Patent without evidence of aneurysm, dissection, vasculitis or significant stenosis. Renals: Both  renal arteries are patent without evidence of aneurysm, dissection, vasculitis, fibromuscular dysplasia or significant stenosis. IMA: Patent without evidence of aneurysm, dissection, vasculitis or significant stenosis. Inflow: Patent without evidence of aneurysm, dissection, vasculitis or significant stenosis. Scattered mild atherosclerotic plaque. Veins: No obvious venous abnormality within the limitations of this arterial phase study. Review of the MIP images confirms the above findings. NON-VASCULAR Hepatobiliary: 3.9 cm hypodense, fluid attenuating right hepatic mass consistent with a cyst unchanged compared with MRI of the thoracic aorta dated 09/03/2017. Mild intrahepatic biliary ductal dilatation likely related to a post cholecystectomy state. Gallbladder surgically absent. Pancreas: Unremarkable. No pancreatic ductal dilatation or surrounding inflammatory changes. Spleen: Normal in size without focal abnormality. Adrenals/Urinary Tract: Adrenal glands are unremarkable. Kidneys are normal, without renal calculi, focal lesion, or hydronephrosis. Bladder is unremarkable. Stomach/Bowel: Stomach is within normal limits. Appendix appears normal. No evidence of bowel wall thickening, distention, or inflammatory changes. Lymphatic: No lymphadenopathy. Reproductive: Uterus and bilateral adnexa are unremarkable. Other: No abdominal wall hernia or abnormality. No abdominopelvic ascites. Musculoskeletal: No acute osseous abnormality. No aggressive osseous lesion. Moderate osteoarthritis of the right hip. Mild osteoarthritis of the left hip. 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 Review of the MIP images confirms the above findings. IMPRESSION: 1. No aortic dissection. 2. Ascending thoracic aortic aneurysm measuring 4 cm in maximum diameter. 3. Mild esophageal wall thickening as can be seen with esophagitis. 4. 2 mm right thyroid nodule. Not clinically significant; no follow-up imaging recommended  (ref: J Am Coll Radiol. 2015 Feb;12(2): 143-50). Electronically Signed   By: Kathreen Devoid   On: 09/15/2019 12:58   US Abdomen Limited RUQ  Result Date: 09/15/2019 CLINICAL DATA:  Epigastric pain.  Previous cholecystectomy. EXAM: ULTRASOUND ABDOMEN LIMITED RIGHT UPPER QUADRANT COMPARISON:  CT scan dated 09/15/2019 FINDINGS: Gallbladder: Surgically absent. Common bile duct: Diameter: There is a 5.6 mm stone in the distal common bile duct. Common bile duct is dilated to a diameter 10.3 mm. Liver: 4.3 cm simple cyst in the anterior aspect of the right lobe of the liver. No other focal liver lesions. Within normal limits in parenchymal echogenicity. Portal vein is patent on color Doppler imaging with normal direction of blood flow towards the liver. Other: None. IMPRESSION: 5.6 mm stone in the distal common bile duct. Common bile duct is dilated to a diameter of 10.3 mm. Electronically Signed   By: Lorriane Shire M.D.   On: 09/15/2019 16:07    Scheduled Meds: . amLODipine  2.5 mg Oral Daily  . calcium-vitamin D  2 tablet Oral Daily  . cholecalciferol  1,000 Units Oral Daily  . multivitamin with minerals  1 tablet Oral Daily  . sertraline  50 mg Oral Daily    Continuous Infusions: . sodium chloride 75 mL/hr at 09/16/19 0947     LOS: 1 day     Kayleen Memos, MD [facility] Pager [phone]  If 7PM-7AM, please contact night-coverage [email or web address] Password [email or web address] 09/16/2019, 10:17 AM

## 2019-09-16 NOTE — H&P (View-Only) (Signed)
[facility] Consult  Referring Provider: ER/Dr.Little Primary Care Physician:  Deland Pretty, MD Primary Gastroenterologist: Dr.Magod  Reason for Consultation: Choledocholithiasis  HPI: Kristine Jones is a 69 y.o. female was in her usual state of health until Saturday when she woke up at 3 AM with severe epigastric pain.  Initially she thought it was related to heartburn and did improve a few hours later.  She had recurrence of similar pain on Monday which prompted her to come to the ED.  Patient was found to be slightly hypotensive with systolic blood pressure in 90s, bradycardic with a heart rate of 40s in route to ED which has completely resolved since admission. During the episode of abdominal pain, patient complains of feeling cold and clammy. Patient denies fever, complains of mild nausea, no vomiting, denies radiation of pain to her back or shoulders. She had a cholecystectomy about 18 years ago. She has history of gallstone pancreatitis. Patient states she may have 1 glass of alcohol once a week.   Recently she denies unintentional weight loss, loss of appetite, acid reflux or heartburn, early satiety or bloating. She reports regular bowel movements, denies noticing blood in stool or black stools, last colonoscopy was in 2016. There is no family history of GI malignancy.   Past Medical History:  Diagnosis Date  . Abnormal stress test    a. 09/2008, ST depression on ETT @ [place], no further w/u.  . Adenomatous colon polyp   . Anxiety   . Anxiety and depression   . Aortic insufficiency    a. 06/2012 Echo: EF 55-60%, Gr 1 DD, Mild AI, Triv MR, mildly dil LA, nl RV fxn.  Marland Kitchen History of melanoma    Followed with Dr. Tonia Brooms  . HTN (hypertension)   . Hyperlipidemia   . Leukocytosis    Mild  . Migraines   . Osteopenia   . Varicose veins     Past Surgical History:  Procedure Laterality Date  . BTL    . CHOLECYSTECTOMY    . DILATION AND CURETTAGE OF UTERUS     . FEMORAL HERNIA REPAIR    . High endocervical polyp removed    . LIPOMA EXCISION Right 03/23/2015   Procedure: EXCISION LARGE LIPOMA RIGHT UPPER ARM  ;  Surgeon: Youlanda Roys, MD;  Location: [facility];  Service: Plastics;  Laterality: Right;  . MELANOMA EXCISION  2003   right calf  . OVARIAN CYST REMOVAL      Prior to Admission medications   Medication Sig Start Date End Date Taking? Authorizing Provider  amLODipine (NORVASC) 2.5 MG tablet Take 2.5 mg by mouth daily.   Yes [provider]  Calcium Carbonate-Vitamin D (CALCIUM-VITAMIN D3) 600-200 MG-UNIT TABS Take 2 tablets by mouth daily.    Yes [provider]  Cholecalciferol (VITAMIN D) 1000 UNITS capsule Take 1,000 Units by mouth daily.    Yes [provider]  estradiol (ESTRACE) 0.1 MG/GM vaginal cream Place 1 Applicatorful vaginally as needed (vaginal discomfort).    Yes [provider]  fish oil-omega-3 fatty acids 1000 MG capsule Take 1 g by mouth daily.     Yes [provider]  fluticasone (FLONASE) 50 MCG/ACT nasal spray Place 1 spray into both nostrils daily as needed for allergies or rhinitis.   Yes [provider]  multivitamin [facility]) per tablet Take 1 tablet by mouth daily.     Yes [provider]  sertraline (ZOLOFT) 50 MG tablet Take  50 mg by mouth daily.   Yes [provider]  alendronate (FOSAMAX) 70 MG tablet Take 70 mg by mouth once a week.  03/07/19   [provider]  ibandronate (BONIVA) 150 MG tablet Take 150 mg by mouth every 30 (thirty) days. 07/14/19   [provider]  risedronate (ACTONEL) 35 MG tablet Take 35 mg by mouth once a week. 09/05/19   [provider]    Current Facility-Administered Medications  Medication Dose Route Frequency Provider Last Rate Last Admin  . 0.9 %  sodium chloride infusion   Intravenous Continuous Pahwani, Rinka R, MD 75 mL/hr at 09/16/19 0500 Rate Verify at  09/16/19 0500  . acetaminophen (TYLENOL) tablet 650 mg  650 mg Oral Q6H PRN Pahwani, Rinka R, MD       Or  . acetaminophen (TYLENOL) suppository 650 mg  650 mg Rectal Q6H PRN Pahwani, Rinka R, MD      . calcium-vitamin D (OSCAL WITH D) 500-200 MG-UNIT per tablet 2 tablet  2 tablet Oral Daily Pahwani, Rinka R, MD      . cholecalciferol (VITAMIN D3) tablet 1,000 Units  1,000 Units Oral Daily Pahwani, Rinka R, MD   1,000 Units at 09/15/19 1803  . morphine 2 MG/ML injection 2 mg  2 mg Intravenous Q4H PRN Pahwani, Rinka R, MD   2 mg at 09/16/19 0227  . multivitamin with minerals tablet 1 tablet  1 tablet Oral Daily Pahwani, Rinka R, MD   1 tablet at 09/15/19 1802  . ondansetron (ZOFRAN) tablet 4 mg  4 mg Oral Q6H PRN Pahwani, Rinka R, MD       Or  . ondansetron (ZOFRAN) injection 4 mg  4 mg Intravenous Q6H PRN Pahwani, Rinka R, MD      . sertraline (ZOLOFT) tablet 50 mg  50 mg Oral Daily Pahwani, Rinka R, MD   50 mg at 09/15/19 1752    Allergies as of 09/15/2019 - Review Complete 09/15/2019  Allergen Reaction Noted  . Indocin [indomethacin] Other (See Comments) 03/10/2015  . Ibuprofen Other (See Comments) 01/30/2014  . Naproxen Other (See Comments) 12/26/2012  . Penicillins Other (See Comments) 11/20/2012  . Lisinopril Other (See Comments) 03/05/2019  . Naproxen sodium Other (See Comments) 03/05/2019  . Triamterene Other (See Comments) 03/11/2015    Family History  Problem Relation Age of Onset  . Hypertension Mother   . Migraines Mother   . Hypertension Father   . Lung cancer Father     Social History   Socioeconomic History  . Marital status: Married    Spouse name: Not on file  . Number of children: Not on file  . Years of education: Not on file  . Highest education level: Not on file  Occupational History  . Occupation: Futures trader    Comment: Freelance  Tobacco Use  . Smoking status: Never Smoker  . Smokeless tobacco: Never Used  Substance and Sexual Activity  .  Alcohol use: Yes    Alcohol/week: 1.0 standard drinks    Types: 1 Standard drinks or equivalent per week    Comment: 1 glass of wine per week  . Drug use: No  . Sexual activity: Not on file  Other Topics Concern  . Not on file  Social History Narrative   Married   1 daughter   Gets regular exercise: Walking, yoga, weight lifting   Social Determinants of Health   Financial Resource Strain:   . Difficulty of Paying Living Expenses:  Food Insecurity:   . Worried About Charity fundraiser in the Last Year:   . Arboriculturist in the Last Year:   Transportation Needs:   . Film/video editor (Medical):   Marland Kitchen Lack of Transportation (Non-Medical):   Physical Activity:   . Days of Exercise per Week:   . Minutes of Exercise per Session:   Stress:   . Feeling of Stress :   Social Connections:   . Frequency of Communication with Friends and Family:   . Frequency of Social Gatherings with Friends and Family:   . Attends Religious Services:   . Active Member of Clubs or Organizations:   . Attends Archivist Meetings:   Marland Kitchen Marital Status:   Intimate Partner Violence:   . Fear of Current or Ex-Partner:   . Emotionally Abused:   Marland Kitchen Physically Abused:   . Sexually Abused:     Review of Systems: GI: Described in detail in HPI.    [id number] any fever, chills, rigors, night sweats, anorexia, fatigue, weakness, malaise, involuntary weight loss, and sleep disorder CV: Denies chest pain, angina, palpitations, syncope, orthopnea, PND, peripheral edema, and claudication. Resp: Denies dyspnea, cough, sputum, wheezing, coughing up blood. GU : Denies urinary burning, blood in urine, urinary frequency, urinary hesitancy, nocturnal urination, and urinary incontinence. MS: Denies joint pain or swelling.  Denies muscle weakness, cramps, atrophy.  Derm: Denies rash, itching, oral ulcerations, hives, unhealing ulcers.  Psych: Denies depression, anxiety, memory loss, suicidal ideation,  hallucinations,  and confusion. Heme: Denies bruising, bleeding, and enlarged lymph nodes. Neuro:  Denies any headaches, dizziness, paresthesias. Endo:  Denies any problems with DM, thyroid, adrenal function.  Physical Exam: Vital signs in last 24 hours: Temp:  [97.4 F (36.3 C)-98.5 F (36.9 C)] 98.1 F (36.7 C) (05/04 0456) Pulse Rate:  [58-74] 64 (05/04 0456) Resp:  [12-23] 17 (05/04 0456) BP: (92-158)/(60-94) 145/60 (05/04 0456) SpO2:  [94 %-100 %] 97 % (05/04 0456) Weight:  [71.7 kg-76.1 kg] 76.1 kg (05/04 0456) Last BM Date: 09/15/19  General:   Alert,  Well-developed, well-nourished, pleasant and cooperative in NAD Head:  Normocephalic and atraumatic. Eyes:  Sclera clear, no icterus.   Conjunctiva pink. Ears:  Normal auditory acuity. Nose:  No deformity, discharge,  or lesions. Mouth:  No deformity or lesions.  Oropharynx pink & moist. Neck:  Supple; no masses or thyromegaly. Lungs:  Clear throughout to auscultation.   No wheezes, crackles, or rhonchi. No acute distress. Heart:  Regular rate and rhythm; no murmurs, clicks, rubs,  or gallops. Extremities:  Without clubbing or edema. Neurologic:  Alert and  oriented x4;  grossly normal neurologically. Skin:  Intact without significant lesions or rashes. Psych:  Alert and cooperative. Normal mood and affect. Abdomen:  Soft, nontender and nondistended. No masses, hepatosplenomegaly or hernias noted. Normal bowel sounds, without guarding, and without rebound.         Lab Results: Recent Labs    09/15/19 0951  WBC 4.8  HGB 12.6  HCT 40.1  PLT 220   BMET Recent Labs    09/15/19 0951  NA 139  K 3.5  CL 101  CO2 30  GLUCOSE 117*  BUN 15  CREATININE 0.60  CALCIUM 8.9   LFT Recent Labs    09/15/19 0951  PROT 5.9*  ALBUMIN 3.5  AST 164*  ALT 64*  ALKPHOS 93  BILITOT 1.1   PT/INR Recent Labs    09/15/19 2000  LABPROT 12.8  INR  1.0    Studies/Results: CT Angio Chest/Abd/Pel for Dissection W  and/or Wo Contrast  Result Date: 09/15/2019 CLINICAL DATA:  Chest pain, low back pain, no thoracic aortic aneurysm repair. EXAM: CT ANGIOGRAPHY CHEST, ABDOMEN AND PELVIS TECHNIQUE: Non-contrast CT of the chest was initially obtained. Multidetector CT imaging through the chest, abdomen and pelvis was performed using the standard protocol during bolus administration of intravenous contrast. Multiplanar reconstructed images and MIPs were obtained and reviewed to evaluate the vascular anatomy. CONTRAST:  137mL OMNIPAQUE IOHEXOL 350 MG/ML SOLN COMPARISON:  MRI thoracic aorta 09/03/2017 FINDINGS: CTA CHEST FINDINGS Cardiovascular: Preferential opacification of the thoracic aorta. Ascending thoracic aortic aneurysm measuring 4 cm in maximum diameter. The aortic root remains normal in size. Normal 3 vessel are aortic arch anatomy. Normal pulmonary arteries. Normal heart size. No pericardial effusion. Mediastinum/Nodes: No enlarged mediastinal, hilar, or axillary lymph nodes. 2 mm right thyroid hypodense nodule. Trachea is normal. Mild esophageal wall thickening as can be seen with esophagitis. Lungs/Pleura: Lungs are clear. No pleural effusion or pneumothorax. Musculoskeletal: No chest wall abnormality. No acute or significant osseous findings. Review of the MIP images confirms the above findings. CTA ABDOMEN AND PELVIS FINDINGS VASCULAR Aorta: Normal caliber aorta without aneurysm, dissection, vasculitis or significant stenosis. Celiac: Patent without evidence of aneurysm, dissection, vasculitis or significant stenosis. SMA: Patent without evidence of aneurysm, dissection, vasculitis or significant stenosis. Renals: Both renal arteries are patent without evidence of aneurysm, dissection, vasculitis, fibromuscular dysplasia or significant stenosis. IMA: Patent without evidence of aneurysm, dissection, vasculitis or significant stenosis. Inflow: Patent without evidence of aneurysm, dissection, vasculitis or significant  stenosis. Scattered mild atherosclerotic plaque. Veins: No obvious venous abnormality within the limitations of this arterial phase study. Review of the MIP images confirms the above findings. NON-VASCULAR Hepatobiliary: 3.9 cm hypodense, fluid attenuating right hepatic mass consistent with a cyst unchanged compared with MRI of the thoracic aorta dated 09/03/2017. Mild intrahepatic biliary ductal dilatation likely related to a post cholecystectomy state. Gallbladder surgically absent. Pancreas: Unremarkable. No pancreatic ductal dilatation or surrounding inflammatory changes. Spleen: Normal in size without focal abnormality. Adrenals/Urinary Tract: Adrenal glands are unremarkable. Kidneys are normal, without renal calculi, focal lesion, or hydronephrosis. Bladder is unremarkable. Stomach/Bowel: Stomach is within normal limits. Appendix appears normal. No evidence of bowel wall thickening, distention, or inflammatory changes. Lymphatic: No lymphadenopathy. Reproductive: Uterus and bilateral adnexa are unremarkable. Other: No abdominal wall hernia or abnormality. No abdominopelvic ascites. Musculoskeletal: No acute osseous abnormality. No aggressive osseous lesion. Moderate osteoarthritis of the right hip. Mild osteoarthritis of the left hip. 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 Review of the MIP images confirms the above findings. IMPRESSION: 1. No aortic dissection. 2. Ascending thoracic aortic aneurysm measuring 4 cm in maximum diameter. 3. Mild esophageal wall thickening as can be seen with esophagitis. 4. 2 mm right thyroid nodule. Not clinically significant; no follow-up imaging recommended (ref: J Am Coll Radiol. 2015 Feb;12(2): 143-50). Electronically Signed   By: Kathreen Devoid   On: 09/15/2019 12:58   US Abdomen Limited RUQ  Result Date: 09/15/2019 CLINICAL DATA:  Epigastric pain.  Previous cholecystectomy. EXAM: ULTRASOUND ABDOMEN LIMITED RIGHT UPPER QUADRANT COMPARISON:  CT scan  dated 09/15/2019 FINDINGS: Gallbladder: Surgically absent. Common bile duct: Diameter: There is a 5.6 mm stone in the distal common bile duct. Common bile duct is dilated to a diameter 10.3 mm. Liver: 4.3 cm simple cyst in the anterior aspect of the right lobe of the liver. No other focal liver  lesions. Within normal limits in parenchymal echogenicity. Portal vein is patent on color Doppler imaging with normal direction of blood flow towards the liver. Other: None. IMPRESSION: 5.6 mm stone in the distal common bile duct. Common bile duct is dilated to a diameter of 10.3 mm. Electronically Signed   By: Lorriane Shire M.D.   On: 09/15/2019 16:07    Impression: 5.6 mm stone in distal common bile duct with dilated common bile duct of 10.3 mm T bili/AST/ALT/ALP of one-point 1/164/64/93 No leukocytosis, no fever and no abdominal pain currently  Ascending thoracic aortic aneurysm measuring 4 cm  Mild esophageal wall thickening  Plan: Initial plan was to do an ERCP today for CBD stone removal, however due to add-on emergencies and anesthesia availability, procedure will be rescheduled for known on 09/17/2019. Patient is aware, will start on clear liquid diet and keep her n.p.o. post midnight. The risks(including infection, bleeding, perforation, pancreatitis, anesthesia complications) and the benefits of the procedure were discussed with the patient in details. She verbalized understanding and consents.   LOS: 1 day   Ronnette Juniper, MD  09/16/2019, 9:00 AM

## 2019-09-16 NOTE — Consult Note (Signed)
[facility] Consult  Referring Provider: ER/Dr.Little Primary Care Physician:  Deland Pretty, MD Primary Gastroenterologist: Dr.Magod  Reason for Consultation: Choledocholithiasis  HPI: Kristine Jones is a 69 y.o. female was in her usual state of health until Saturday when she woke up at 3 AM with severe epigastric pain.  Initially she thought it was related to heartburn and did improve a few hours later.  She had recurrence of similar pain on Monday which prompted her to come to the ED.  Patient was found to be slightly hypotensive with systolic blood pressure in 90s, bradycardic with a heart rate of 40s in route to ED which has completely resolved since admission. During the episode of abdominal pain, patient complains of feeling cold and clammy. Patient denies fever, complains of mild nausea, no vomiting, denies radiation of pain to her back or shoulders. She had a cholecystectomy about 18 years ago. She has history of gallstone pancreatitis. Patient states she may have 1 glass of alcohol once a week.   Recently she denies unintentional weight loss, loss of appetite, acid reflux or heartburn, early satiety or bloating. She reports regular bowel movements, denies noticing blood in stool or black stools, last colonoscopy was in 2016. There is no family history of GI malignancy.   Past Medical History:  Diagnosis Date  . Abnormal stress test    a. 09/2008, ST depression on ETT @ [place], no further w/u.  . Adenomatous colon polyp   . Anxiety   . Anxiety and depression   . Aortic insufficiency    a. 06/2012 Echo: EF 55-60%, Gr 1 DD, Mild AI, Triv MR, mildly dil LA, nl RV fxn.  Marland Kitchen History of melanoma    Followed with Dr. Tonia Brooms  . HTN (hypertension)   . Hyperlipidemia   . Leukocytosis    Mild  . Migraines   . Osteopenia   . Varicose veins     Past Surgical History:  Procedure Laterality Date  . BTL    . CHOLECYSTECTOMY    . DILATION AND CURETTAGE OF UTERUS     . FEMORAL HERNIA REPAIR    . High endocervical polyp removed    . LIPOMA EXCISION Right 03/23/2015   Procedure: EXCISION LARGE LIPOMA RIGHT UPPER ARM  ;  Surgeon: Youlanda Roys, MD;  Location: [facility];  Service: Plastics;  Laterality: Right;  . MELANOMA EXCISION  2003   right calf  . OVARIAN CYST REMOVAL      Prior to Admission medications   Medication Sig Start Date End Date Taking? Authorizing Provider  amLODipine (NORVASC) 2.5 MG tablet Take 2.5 mg by mouth daily.   Yes [provider]  Calcium Carbonate-Vitamin D (CALCIUM-VITAMIN D3) 600-200 MG-UNIT TABS Take 2 tablets by mouth daily.    Yes [provider]  Cholecalciferol (VITAMIN D) 1000 UNITS capsule Take 1,000 Units by mouth daily.    Yes [provider]  estradiol (ESTRACE) 0.1 MG/GM vaginal cream Place 1 Applicatorful vaginally as needed (vaginal discomfort).    Yes [provider]  fish oil-omega-3 fatty acids 1000 MG capsule Take 1 g by mouth daily.     Yes [provider]  fluticasone (FLONASE) 50 MCG/ACT nasal spray Place 1 spray into both nostrils daily as needed for allergies or rhinitis.   Yes [provider]  multivitamin [facility]) per tablet Take 1 tablet by mouth daily.     Yes [provider]  sertraline (ZOLOFT) 50 MG tablet Take  50 mg by mouth daily.   Yes [provider]  alendronate (FOSAMAX) 70 MG tablet Take 70 mg by mouth once a week.  03/07/19   [provider]  ibandronate (BONIVA) 150 MG tablet Take 150 mg by mouth every 30 (thirty) days. 07/14/19   [provider]  risedronate (ACTONEL) 35 MG tablet Take 35 mg by mouth once a week. 09/05/19   [provider]    Current Facility-Administered Medications  Medication Dose Route Frequency Provider Last Rate Last Admin  . 0.9 %  sodium chloride infusion   Intravenous Continuous Pahwani, Rinka R, MD 75 mL/hr at 09/16/19 0500 Rate Verify at  09/16/19 0500  . acetaminophen (TYLENOL) tablet 650 mg  650 mg Oral Q6H PRN Pahwani, Rinka R, MD       Or  . acetaminophen (TYLENOL) suppository 650 mg  650 mg Rectal Q6H PRN Pahwani, Rinka R, MD      . calcium-vitamin D (OSCAL WITH D) 500-200 MG-UNIT per tablet 2 tablet  2 tablet Oral Daily Pahwani, Rinka R, MD      . cholecalciferol (VITAMIN D3) tablet 1,000 Units  1,000 Units Oral Daily Pahwani, Rinka R, MD   1,000 Units at 09/15/19 1803  . morphine 2 MG/ML injection 2 mg  2 mg Intravenous Q4H PRN Pahwani, Rinka R, MD   2 mg at 09/16/19 0227  . multivitamin with minerals tablet 1 tablet  1 tablet Oral Daily Pahwani, Rinka R, MD   1 tablet at 09/15/19 1802  . ondansetron (ZOFRAN) tablet 4 mg  4 mg Oral Q6H PRN Pahwani, Rinka R, MD       Or  . ondansetron (ZOFRAN) injection 4 mg  4 mg Intravenous Q6H PRN Pahwani, Rinka R, MD      . sertraline (ZOLOFT) tablet 50 mg  50 mg Oral Daily Pahwani, Rinka R, MD   50 mg at 09/15/19 1752    Allergies as of 09/15/2019 - Review Complete 09/15/2019  Allergen Reaction Noted  . Indocin [indomethacin] Other (See Comments) 03/10/2015  . Ibuprofen Other (See Comments) 01/30/2014  . Naproxen Other (See Comments) 12/26/2012  . Penicillins Other (See Comments) 11/20/2012  . Lisinopril Other (See Comments) 03/05/2019  . Naproxen sodium Other (See Comments) 03/05/2019  . Triamterene Other (See Comments) 03/11/2015    Family History  Problem Relation Age of Onset  . Hypertension Mother   . Migraines Mother   . Hypertension Father   . Lung cancer Father     Social History   Socioeconomic History  . Marital status: Married    Spouse name: Not on file  . Number of children: Not on file  . Years of education: Not on file  . Highest education level: Not on file  Occupational History  . Occupation: Futures trader    Comment: Freelance  Tobacco Use  . Smoking status: Never Smoker  . Smokeless tobacco: Never Used  Substance and Sexual Activity  .  Alcohol use: Yes    Alcohol/week: 1.0 standard drinks    Types: 1 Standard drinks or equivalent per week    Comment: 1 glass of wine per week  . Drug use: No  . Sexual activity: Not on file  Other Topics Concern  . Not on file  Social History Narrative   Married   1 daughter   Gets regular exercise: Walking, yoga, weight lifting   Social Determinants of Health   Financial Resource Strain:   . Difficulty of Paying Living Expenses:  Food Insecurity:   . Worried About Charity fundraiser in the Last Year:   . Arboriculturist in the Last Year:   Transportation Needs:   . Film/video editor (Medical):   Marland Kitchen Lack of Transportation (Non-Medical):   Physical Activity:   . Days of Exercise per Week:   . Minutes of Exercise per Session:   Stress:   . Feeling of Stress :   Social Connections:   . Frequency of Communication with Friends and Family:   . Frequency of Social Gatherings with Friends and Family:   . Attends Religious Services:   . Active Member of Clubs or Organizations:   . Attends Archivist Meetings:   Marland Kitchen Marital Status:   Intimate Partner Violence:   . Fear of Current or Ex-Partner:   . Emotionally Abused:   Marland Kitchen Physically Abused:   . Sexually Abused:     Review of Systems: GI: Described in detail in HPI.    [id number] any fever, chills, rigors, night sweats, anorexia, fatigue, weakness, malaise, involuntary weight loss, and sleep disorder CV: Denies chest pain, angina, palpitations, syncope, orthopnea, PND, peripheral edema, and claudication. Resp: Denies dyspnea, cough, sputum, wheezing, coughing up blood. GU : Denies urinary burning, blood in urine, urinary frequency, urinary hesitancy, nocturnal urination, and urinary incontinence. MS: Denies joint pain or swelling.  Denies muscle weakness, cramps, atrophy.  Derm: Denies rash, itching, oral ulcerations, hives, unhealing ulcers.  Psych: Denies depression, anxiety, memory loss, suicidal ideation,  hallucinations,  and confusion. Heme: Denies bruising, bleeding, and enlarged lymph nodes. Neuro:  Denies any headaches, dizziness, paresthesias. Endo:  Denies any problems with DM, thyroid, adrenal function.  Physical Exam: Vital signs in last 24 hours: Temp:  [97.4 F (36.3 C)-98.5 F (36.9 C)] 98.1 F (36.7 C) (05/04 0456) Pulse Rate:  [58-74] 64 (05/04 0456) Resp:  [12-23] 17 (05/04 0456) BP: (92-158)/(60-94) 145/60 (05/04 0456) SpO2:  [94 %-100 %] 97 % (05/04 0456) Weight:  [71.7 kg-76.1 kg] 76.1 kg (05/04 0456) Last BM Date: 09/15/19  General:   Alert,  Well-developed, well-nourished, pleasant and cooperative in NAD Head:  Normocephalic and atraumatic. Eyes:  Sclera clear, no icterus.   Conjunctiva pink. Ears:  Normal auditory acuity. Nose:  No deformity, discharge,  or lesions. Mouth:  No deformity or lesions.  Oropharynx pink & moist. Neck:  Supple; no masses or thyromegaly. Lungs:  Clear throughout to auscultation.   No wheezes, crackles, or rhonchi. No acute distress. Heart:  Regular rate and rhythm; no murmurs, clicks, rubs,  or gallops. Extremities:  Without clubbing or edema. Neurologic:  Alert and  oriented x4;  grossly normal neurologically. Skin:  Intact without significant lesions or rashes. Psych:  Alert and cooperative. Normal mood and affect. Abdomen:  Soft, nontender and nondistended. No masses, hepatosplenomegaly or hernias noted. Normal bowel sounds, without guarding, and without rebound.         Lab Results: Recent Labs    09/15/19 0951  WBC 4.8  HGB 12.6  HCT 40.1  PLT 220   BMET Recent Labs    09/15/19 0951  NA 139  K 3.5  CL 101  CO2 30  GLUCOSE 117*  BUN 15  CREATININE 0.60  CALCIUM 8.9   LFT Recent Labs    09/15/19 0951  PROT 5.9*  ALBUMIN 3.5  AST 164*  ALT 64*  ALKPHOS 93  BILITOT 1.1   PT/INR Recent Labs    09/15/19 2000  LABPROT 12.8  INR  1.0    Studies/Results: CT Angio Chest/Abd/Pel for Dissection W  and/or Wo Contrast  Result Date: 09/15/2019 CLINICAL DATA:  Chest pain, low back pain, no thoracic aortic aneurysm repair. EXAM: CT ANGIOGRAPHY CHEST, ABDOMEN AND PELVIS TECHNIQUE: Non-contrast CT of the chest was initially obtained. Multidetector CT imaging through the chest, abdomen and pelvis was performed using the standard protocol during bolus administration of intravenous contrast. Multiplanar reconstructed images and MIPs were obtained and reviewed to evaluate the vascular anatomy. CONTRAST:  152mL OMNIPAQUE IOHEXOL 350 MG/ML SOLN COMPARISON:  MRI thoracic aorta 09/03/2017 FINDINGS: CTA CHEST FINDINGS Cardiovascular: Preferential opacification of the thoracic aorta. Ascending thoracic aortic aneurysm measuring 4 cm in maximum diameter. The aortic root remains normal in size. Normal 3 vessel are aortic arch anatomy. Normal pulmonary arteries. Normal heart size. No pericardial effusion. Mediastinum/Nodes: No enlarged mediastinal, hilar, or axillary lymph nodes. 2 mm right thyroid hypodense nodule. Trachea is normal. Mild esophageal wall thickening as can be seen with esophagitis. Lungs/Pleura: Lungs are clear. No pleural effusion or pneumothorax. Musculoskeletal: No chest wall abnormality. No acute or significant osseous findings. Review of the MIP images confirms the above findings. CTA ABDOMEN AND PELVIS FINDINGS VASCULAR Aorta: Normal caliber aorta without aneurysm, dissection, vasculitis or significant stenosis. Celiac: Patent without evidence of aneurysm, dissection, vasculitis or significant stenosis. SMA: Patent without evidence of aneurysm, dissection, vasculitis or significant stenosis. Renals: Both renal arteries are patent without evidence of aneurysm, dissection, vasculitis, fibromuscular dysplasia or significant stenosis. IMA: Patent without evidence of aneurysm, dissection, vasculitis or significant stenosis. Inflow: Patent without evidence of aneurysm, dissection, vasculitis or significant  stenosis. Scattered mild atherosclerotic plaque. Veins: No obvious venous abnormality within the limitations of this arterial phase study. Review of the MIP images confirms the above findings. NON-VASCULAR Hepatobiliary: 3.9 cm hypodense, fluid attenuating right hepatic mass consistent with a cyst unchanged compared with MRI of the thoracic aorta dated 09/03/2017. Mild intrahepatic biliary ductal dilatation likely related to a post cholecystectomy state. Gallbladder surgically absent. Pancreas: Unremarkable. No pancreatic ductal dilatation or surrounding inflammatory changes. Spleen: Normal in size without focal abnormality. Adrenals/Urinary Tract: Adrenal glands are unremarkable. Kidneys are normal, without renal calculi, focal lesion, or hydronephrosis. Bladder is unremarkable. Stomach/Bowel: Stomach is within normal limits. Appendix appears normal. No evidence of bowel wall thickening, distention, or inflammatory changes. Lymphatic: No lymphadenopathy. Reproductive: Uterus and bilateral adnexa are unremarkable. Other: No abdominal wall hernia or abnormality. No abdominopelvic ascites. Musculoskeletal: No acute osseous abnormality. No aggressive osseous lesion. Moderate osteoarthritis of the right hip. Mild osteoarthritis of the left hip. 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 Review of the MIP images confirms the above findings. IMPRESSION: 1. No aortic dissection. 2. Ascending thoracic aortic aneurysm measuring 4 cm in maximum diameter. 3. Mild esophageal wall thickening as can be seen with esophagitis. 4. 2 mm right thyroid nodule. Not clinically significant; no follow-up imaging recommended (ref: J Am Coll Radiol. 2015 Feb;12(2): 143-50). Electronically Signed   By: Kathreen Devoid   On: 09/15/2019 12:58   US Abdomen Limited RUQ  Result Date: 09/15/2019 CLINICAL DATA:  Epigastric pain.  Previous cholecystectomy. EXAM: ULTRASOUND ABDOMEN LIMITED RIGHT UPPER QUADRANT COMPARISON:  CT scan  dated 09/15/2019 FINDINGS: Gallbladder: Surgically absent. Common bile duct: Diameter: There is a 5.6 mm stone in the distal common bile duct. Common bile duct is dilated to a diameter 10.3 mm. Liver: 4.3 cm simple cyst in the anterior aspect of the right lobe of the liver. No other focal liver  lesions. Within normal limits in parenchymal echogenicity. Portal vein is patent on color Doppler imaging with normal direction of blood flow towards the liver. Other: None. IMPRESSION: 5.6 mm stone in the distal common bile duct. Common bile duct is dilated to a diameter of 10.3 mm. Electronically Signed   By: Lorriane Shire M.D.   On: 09/15/2019 16:07    Impression: 5.6 mm stone in distal common bile duct with dilated common bile duct of 10.3 mm T bili/AST/ALT/ALP of one-point 1/164/64/93 No leukocytosis, no fever and no abdominal pain currently  Ascending thoracic aortic aneurysm measuring 4 cm  Mild esophageal wall thickening  Plan: Initial plan was to do an ERCP today for CBD stone removal, however due to add-on emergencies and anesthesia availability, procedure will be rescheduled for known on 09/17/2019. Patient is aware, will start on clear liquid diet and keep her n.p.o. post midnight. The risks(including infection, bleeding, perforation, pancreatitis, anesthesia complications) and the benefits of the procedure were discussed with the patient in details. She verbalized understanding and consents.   LOS: 1 day   Ronnette Juniper, MD  09/16/2019, 9:00 AM

## 2019-09-17 ENCOUNTER — Inpatient Hospital Stay (HOSPITAL_COMMUNITY): Payer: Medicare Other | Admitting: Certified Registered"

## 2019-09-17 ENCOUNTER — Encounter (HOSPITAL_COMMUNITY): Payer: Self-pay | Admitting: Internal Medicine

## 2019-09-17 ENCOUNTER — Encounter (HOSPITAL_COMMUNITY)
Admission: EM | Disposition: A | Discharge status: Home / Self Care | Payer: Self-pay | Source: Home / Self Care | Attending: Internal Medicine

## 2019-09-17 ENCOUNTER — Inpatient Hospital Stay (HOSPITAL_COMMUNITY): Payer: Medicare Other

## 2019-09-17 DIAGNOSIS — R1013 Epigastric pain: Secondary | ICD-10-CM

## 2019-09-17 DIAGNOSIS — F419 Anxiety disorder, unspecified: Secondary | ICD-10-CM

## 2019-09-17 DIAGNOSIS — R748 Abnormal levels of other serum enzymes: Secondary | ICD-10-CM

## 2019-09-17 DIAGNOSIS — F329 Major depressive disorder, single episode, unspecified: Secondary | ICD-10-CM

## 2019-09-17 DIAGNOSIS — K838 Other specified diseases of biliary tract: Secondary | ICD-10-CM

## 2019-09-17 HISTORY — PX: REMOVAL OF STONES: SHX5545

## 2019-09-17 HISTORY — PX: ERCP: SHX5425

## 2019-09-17 HISTORY — PX: SPHINCTEROTOMY: SHX5544

## 2019-09-17 LAB — CBC
HCT: 38.2 % (ref 36.0–46.0)
Hemoglobin: 12.2 g/dL (ref 12.0–15.0)
MCH: 29.3 pg (ref 26.0–34.0)
MCHC: 31.9 g/dL (ref 30.0–36.0)
MCV: 91.8 fL (ref 80.0–100.0)
Platelets: 210 10*3/uL (ref 150–400)
RBC: 4.16 MIL/uL (ref 3.87–5.11)
RDW: 13.2 % (ref 11.5–15.5)
WBC: 2.6 10*3/uL — ABNORMAL LOW (ref 4.0–10.5)
nRBC: 0 % (ref 0.0–0.2)

## 2019-09-17 LAB — COMPREHENSIVE METABOLIC PANEL
ALT: 241 U/L — ABNORMAL HIGH (ref 0–44)
AST: 233 U/L — ABNORMAL HIGH (ref 15–41)
Albumin: 3.2 g/dL — ABNORMAL LOW (ref 3.5–5.0)
Alkaline Phosphatase: 171 U/L — ABNORMAL HIGH (ref 38–126)
Anion gap: 11 (ref 5–15)
BUN: 9 mg/dL (ref 8–23)
CO2: 25 mmol/L (ref 22–32)
Calcium: 8.8 mg/dL — ABNORMAL LOW (ref 8.9–10.3)
Chloride: 106 mmol/L (ref 98–111)
Creatinine, Ser: 0.49 mg/dL (ref 0.44–1.00)
GFR calc Af Amer: >60 mL/min (ref >60)
GFR calc non Af Amer: >60 mL/min (ref >60)
Glucose, Bld: 87 mg/dL (ref 70–99)
Potassium: 2.9 mmol/L — ABNORMAL LOW (ref 3.5–5.1)
Sodium: 142 mmol/L (ref 135–145)
Total Bilirubin: 3.1 mg/dL — ABNORMAL HIGH (ref 0.3–1.2)
Total Protein: 5.5 g/dL — ABNORMAL LOW (ref 6.5–8.1)

## 2019-09-17 LAB — POCT I-STAT, CHEM 8
BUN: 8 mg/dL (ref 8–23)
Calcium, Ion: 1.09 mmol/L — ABNORMAL LOW (ref 1.15–1.40)
Chloride: 106 mmol/L (ref 98–111)
Creatinine, Ser: 0.5 mg/dL (ref 0.44–1.00)
Glucose, Bld: 82 mg/dL (ref 70–99)
HCT: 41 % (ref 36.0–46.0)
Hemoglobin: 13.9 g/dL (ref 12.0–15.0)
Potassium: 3.3 mmol/L — ABNORMAL LOW (ref 3.5–5.1)
Sodium: 143 mmol/L (ref 135–145)
TCO2: 27 mmol/L (ref 22–32)

## 2019-09-17 LAB — MAGNESIUM: Magnesium: 1.9 mg/dL (ref 1.7–2.4)

## 2019-09-17 SURGERY — ERCP, WITH INTERVENTION IF INDICATED
Anesthesia: General

## 2019-09-17 MED ORDER — SODIUM CHLORIDE 0.9 % IV SOLN: INTRAVENOUS | Status: DC

## 2019-09-17 MED ORDER — SUCCINYLCHOLINE CHLORIDE 200 MG/10ML IV SOSY
PREFILLED_SYRINGE | INTRAVENOUS | Status: DC | PRN
  Administered 2019-09-17: 120 mg via INTRAVENOUS

## 2019-09-17 MED ORDER — SUGAMMADEX SODIUM 200 MG/2ML IV SOLN
INTRAVENOUS | Status: DC | PRN
  Administered 2019-09-17: 200 mg via INTRAVENOUS

## 2019-09-17 MED ORDER — LIDOCAINE 2% (20 MG/ML) 5 ML SYRINGE
INTRAMUSCULAR | Status: DC | PRN
  Administered 2019-09-17: 100 mg via INTRAVENOUS

## 2019-09-17 MED ORDER — LACTATED RINGERS IV SOLN: INTRAVENOUS | Status: DC | PRN

## 2019-09-17 MED ORDER — CIPROFLOXACIN IN D5W 400 MG/200ML IV SOLN
INTRAVENOUS | Status: AC
  Filled 2019-09-17: qty 200

## 2019-09-17 MED ORDER — CIPROFLOXACIN IN D5W 400 MG/200ML IV SOLN
INTRAVENOUS | Status: DC | PRN
  Administered 2019-09-17: 400 mg via INTRAVENOUS

## 2019-09-17 MED ORDER — FENTANYL CITRATE (PF) 100 MCG/2ML IJ SOLN
INTRAMUSCULAR | Status: DC | PRN
  Administered 2019-09-17 (×2): 50 ug via INTRAVENOUS

## 2019-09-17 MED ORDER — GLUCAGON HCL RDNA (DIAGNOSTIC) 1 MG IJ SOLR
INTRAMUSCULAR | Status: AC
  Filled 2019-09-17: qty 1

## 2019-09-17 MED ORDER — POTASSIUM CHLORIDE 10 MEQ/100ML IV SOLN
10.0000 meq | INTRAVENOUS | Status: AC
  Administered 2019-09-17 (×2): 10 meq via INTRAVENOUS
  Filled 2019-09-17 (×2): qty 100

## 2019-09-17 MED ORDER — INDOMETHACIN 50 MG RE SUPP
RECTAL | Status: AC
  Filled 2019-09-17: qty 2

## 2019-09-17 MED ORDER — PHENYLEPHRINE 40 MCG/ML (10ML) SYRINGE FOR IV PUSH (FOR BLOOD PRESSURE SUPPORT)
PREFILLED_SYRINGE | INTRAVENOUS | Status: DC | PRN
  Administered 2019-09-17: 80 ug via INTRAVENOUS

## 2019-09-17 MED ORDER — INDOMETHACIN 50 MG RE SUPP
RECTAL | Status: DC | PRN
  Administered 2019-09-17: 100 mg via RECTAL

## 2019-09-17 MED ORDER — DEXAMETHASONE SODIUM PHOSPHATE 10 MG/ML IJ SOLN
INTRAMUSCULAR | Status: DC | PRN
  Administered 2019-09-17: 6 mg via INTRAVENOUS

## 2019-09-17 MED ORDER — MIDAZOLAM HCL 5 MG/5ML IJ SOLN
INTRAMUSCULAR | Status: DC | PRN
  Administered 2019-09-17: 2 mg via INTRAVENOUS

## 2019-09-17 MED ORDER — GLYCOPYRROLATE PF 0.2 MG/ML IJ SOSY
PREFILLED_SYRINGE | INTRAMUSCULAR | Status: DC | PRN
  Administered 2019-09-17: .1 mg via INTRAVENOUS

## 2019-09-17 MED ORDER — ROCURONIUM BROMIDE 10 MG/ML (PF) SYRINGE
PREFILLED_SYRINGE | INTRAVENOUS | Status: DC | PRN
  Administered 2019-09-17: 50 mg via INTRAVENOUS

## 2019-09-17 MED ORDER — ONDANSETRON HCL 4 MG/2ML IJ SOLN
INTRAMUSCULAR | Status: DC | PRN
  Administered 2019-09-17: 4 mg via INTRAVENOUS

## 2019-09-17 MED ORDER — SODIUM CHLORIDE 0.9 % IV SOLN
INTRAVENOUS | Status: DC | PRN
  Administered 2019-09-17: 30 mL

## 2019-09-17 MED ORDER — PROPOFOL 10 MG/ML IV BOLUS
INTRAVENOUS | Status: DC | PRN
  Administered 2019-09-17: 150 mg via INTRAVENOUS

## 2019-09-17 NOTE — Progress Notes (Signed)
PROGRESS NOTE    Kristine Jones  [id number] DOB: [birth year] DOA: 09/15/2019 PCP: Deland Pretty, MD   Chief Complaint  Patient presents with  . Abdominal Pain    Brief Narrative:  69 year old lady with prior history of hypertension, hyperlipidemia, anxiety and depression presented to ED with nausea, abdominal pain.  Patient reports she had a cholecystectomy done about 18 years ago.  Imaging shows 5.6 mm stone in the distal common bile duct with dilated CBD of 10.3 mm.  Liver enzymes elevated but improving when compared to yesterday.  Patient is seen and examined today she reports her nausea is better and abdominal pain has improved. She is scheduled for ERCP with CBD stone removal.  Assessment & Plan:   Principal Problem:   Choledocholithiasis Active Problems:   HYPERTENSION, BENIGN   Anxiety and depression   Elevated liver enzymes   Osteopenia   Abdominal pain secondary to choledocholithiasis Scheduled for ERCP today. Appreciate GI recommendations. Liver enzymes are improving when compared to yesterday.  But bilirubin is at 3.1 this morning.   Mild leukopenia probably secondary to acute illness. Continue to monitor.    Essential hypertension Blood pressure parameters are stable.  Resume home medications.   Hypokalemia Replaced.    Thyroid nodule on CT TSH within normal limits Recommend outpatient follow-up.   Ascending thoracic aortic aneurysm measuring about 4 cm. Recommend outpatient follow-up with an ultrasound yearly.  3.9 cm hypodense, fluid attenuating right hepatic mass consistent with a cyst unchanged compared with MRI of the thoracic aorta dated 09/03/2017.   Chronic anxiety and depression Continue Zoloft.       DVT prophylaxis: Lovenox Code Status: Full code Family Communication: None at bedside Disposition:   Status is: Inpatient  Remains inpatient appropriate because:Ongoing diagnostic testing needed not appropriate for  outpatient work up and IV treatments appropriate due to intensity of illness or inability to take PO   Dispo: The patient is from: Home              Anticipated d/c is to: Home              Anticipated d/c date is: 1 day              Patient currently is not medically stable to d/c.        Consultants:   Gastroenterology.   Procedures: ERCP scheduled today.   Antimicrobials: None.   Subjective: No abd pain, no nausea, vomiting .   Objective:  09/16/19 2029 09/17/19 0442 09/17/19 0500  BP: (!) 148/63 (!) 147/71   Pulse: (!) 54 (!) 55   Resp: 16 18   Temp: 98.6 F (37 C) (!) 97.5 F (36.4 C)   TempSrc: Oral Oral   SpO2: 97% 97%   Weight:   73.1 kg  Height:       Intake/Output Summary (Last 24 hours) at 09/17/2019 1145 Last data filed at 09/17/2019 [id number] Gross per 24 hour  Intake 2289.26 ml  Output --  Net 2289.26 ml   Filed Weights   09/16/19 0456 09/17/19 0500 09/17/19 1123  Weight: 76.1 kg 73.1 kg 69.4 kg    Examination:  General exam: Appears calm and comfortable  Respiratory system: Clear to auscultation. Respiratory effort normal. Cardiovascular system: S1 & S2 heard, RRR. No JVD, murmurs, rubs, gallops or clicks. No pedal edema. Gastrointestinal system: Abdomen is nondistended, soft and nontender. . Normal bowel sounds heard. Central nervous system: Alert and oriented. No focal neurological deficits. Extremities: Symmetric 5  x 5 power. Skin: No rashes, lesions or ulcers Psychiatry:Mood & affect appropriate.     Data Reviewed: I have personally reviewed following labs and imaging studies  CBC: Recent Labs  Lab 09/15/19 0951 09/16/19 0902 09/17/19 0325  WBC 4.8 2.3* 2.6*  NEUTROABS 3.6  --   --   HGB 12.6 12.6 12.2  HCT 40.1 40.4 38.2  MCV 92.6 91.6 91.8  PLT 220 227 [id number]    Basic Metabolic Panel: Recent Labs  Lab 09/15/19 0951 09/16/19 0902 09/17/19 0325  NA 139 144 142  K 3.5 3.3* 2.9*  CL 101 111 106  CO2 30 26 25   GLUCOSE 117*  103* 87  BUN 15 10 9   CREATININE 0.60 0.61 0.49  CALCIUM 8.9 9.0 8.8*  MG 1.8  --  1.9    GFR: Estimated Creatinine Clearance: 69.4 mL/min (by C-G formula based on SCr of 0.49 mg/dL).  Liver Function Tests: Recent Labs  Lab 09/15/19 0951 09/16/19 0902 09/17/19 0325  AST 164* 425* 233*  ALT 64* 331* 241*  ALKPHOS 93 173* 171*  BILITOT 1.1 2.8* 3.1*  PROT 5.9* 6.2* 5.5*  ALBUMIN 3.5 3.5 3.2*    CBG: No results for input(s): GLUCAP in the last 168 hours.   Recent Results (from the past 240 hour(s))  Respiratory Panel by RT PCR (Flu A&B, Covid) - Nasopharyngeal Swab     Status: None   Collection Time: 09/15/19  5:25 PM   Specimen: Nasopharyngeal Swab  Result Value Ref Range Status   SARS Coronavirus 2 by RT PCR NEGATIVE NEGATIVE Final    Comment: (NOTE) SARS-CoV-2 target nucleic acids are NOT DETECTED. The SARS-CoV-2 RNA is generally detectable in upper respiratoy specimens during the acute phase of infection. The lowest concentration of SARS-CoV-2 viral copies this assay can detect is 131 copies/mL. A negative result does not preclude SARS-Cov-2 infection and should not be used as the sole basis for treatment or other patient management decisions. A negative result may occur with  improper specimen collection/handling, submission of specimen other than nasopharyngeal swab, presence of viral mutation(s) within the areas targeted by this assay, and inadequate number of viral copies (<131 copies/mL). A negative result must be combined with clinical observations, patient history, and epidemiological information. The expected result is Negative. Fact Sheet for Patients:  PinkCheek.be Fact Sheet for Healthcare Providers:  GravelBags.it This test is not yet ap proved or cleared by the [place] FDA and  has been authorized for detection and/or diagnosis of SARS-CoV-2 by FDA under an Emergency Use Authorization  (EUA). This EUA will remain  in effect (meaning this test can be used) for the duration of the COVID-19 declaration under Section 564(b)(1) of the Act, 21 U.S.C. section 360bbb-3(b)(1), unless the authorization is terminated or revoked sooner.    Influenza A by PCR NEGATIVE NEGATIVE Final   Influenza B by PCR NEGATIVE NEGATIVE Final    Comment: (NOTE) The Xpert Xpress SARS-CoV-2/FLU/RSV assay is intended as an aid in  the diagnosis of influenza from Nasopharyngeal swab specimens and  should not be used as a sole basis for treatment. Nasal washings and  aspirates are unacceptable for Xpert Xpress SARS-CoV-2/FLU/RSV  testing. Fact Sheet for Patients: PinkCheek.be Fact Sheet for Healthcare Providers: GravelBags.it This test is not yet approved or cleared by the [place] FDA and  has been authorized for detection and/or diagnosis of SARS-CoV-2 by  FDA under an Emergency Use Authorization (EUA). This EUA will remain  in effect (meaning this test  can be used) for the duration of the  Covid-19 declaration under Section 564(b)(1) of the Act, 21  U.S.C. section 360bbb-3(b)(1), unless the authorization is  terminated or revoked. Performed at [facility], [place] [address], [place], [place] [address]          Radiology Studies: CT Angio Chest/Abd/Pel for Dissection W and/or Wo Contrast  Result Date: 09/15/2019 CLINICAL DATA:  Chest pain, low back pain, no thoracic aortic aneurysm repair. EXAM: CT ANGIOGRAPHY CHEST, ABDOMEN AND PELVIS TECHNIQUE: Non-contrast CT of the chest was initially obtained. Multidetector CT imaging through the chest, abdomen and pelvis was performed using the standard protocol during bolus administration of intravenous contrast. Multiplanar reconstructed images and MIPs were obtained and reviewed to evaluate the vascular anatomy. CONTRAST:  136mL OMNIPAQUE IOHEXOL 350 MG/ML SOLN COMPARISON:  MRI thoracic  aorta 09/03/2017 FINDINGS: CTA CHEST FINDINGS Cardiovascular: Preferential opacification of the thoracic aorta. Ascending thoracic aortic aneurysm measuring 4 cm in maximum diameter. The aortic root remains normal in size. Normal 3 vessel are aortic arch anatomy. Normal pulmonary arteries. Normal heart size. No pericardial effusion. Mediastinum/Nodes: No enlarged mediastinal, hilar, or axillary lymph nodes. 2 mm right thyroid hypodense nodule. Trachea is normal. Mild esophageal wall thickening as can be seen with esophagitis. Lungs/Pleura: Lungs are clear. No pleural effusion or pneumothorax. Musculoskeletal: No chest wall abnormality. No acute or significant osseous findings. Review of the MIP images confirms the above findings. CTA ABDOMEN AND PELVIS FINDINGS VASCULAR Aorta: Normal caliber aorta without aneurysm, dissection, vasculitis or significant stenosis. Celiac: Patent without evidence of aneurysm, dissection, vasculitis or significant stenosis. SMA: Patent without evidence of aneurysm, dissection, vasculitis or significant stenosis. Renals: Both renal arteries are patent without evidence of aneurysm, dissection, vasculitis, fibromuscular dysplasia or significant stenosis. IMA: Patent without evidence of aneurysm, dissection, vasculitis or significant stenosis. Inflow: Patent without evidence of aneurysm, dissection, vasculitis or significant stenosis. Scattered mild atherosclerotic plaque. Veins: No obvious venous abnormality within the limitations of this arterial phase study. Review of the MIP images confirms the above findings. NON-VASCULAR Hepatobiliary: 3.9 cm hypodense, fluid attenuating right hepatic mass consistent with a cyst unchanged compared with MRI of the thoracic aorta dated 09/03/2017. Mild intrahepatic biliary ductal dilatation likely related to a post cholecystectomy state. Gallbladder surgically absent. Pancreas: Unremarkable. No pancreatic ductal dilatation or surrounding inflammatory  changes. Spleen: Normal in size without focal abnormality. Adrenals/Urinary Tract: Adrenal glands are unremarkable. Kidneys are normal, without renal calculi, focal lesion, or hydronephrosis. Bladder is unremarkable. Stomach/Bowel: Stomach is within normal limits. Appendix appears normal. No evidence of bowel wall thickening, distention, or inflammatory changes. Lymphatic: No lymphadenopathy. Reproductive: Uterus and bilateral adnexa are unremarkable. Other: No abdominal wall hernia or abnormality. No abdominopelvic ascites. Musculoskeletal: No acute osseous abnormality. No aggressive osseous lesion. Moderate osteoarthritis of the right hip. Mild osteoarthritis of the left hip. 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 Review of the MIP images confirms the above findings. IMPRESSION: 1. No aortic dissection. 2. Ascending thoracic aortic aneurysm measuring 4 cm in maximum diameter. 3. Mild esophageal wall thickening as can be seen with esophagitis. 4. 2 mm right thyroid nodule. Not clinically significant; no follow-up imaging recommended (ref: J Am Coll Radiol. 2015 Feb;12(2): 143-50). Electronically Signed   By: Kathreen Devoid   On: 09/15/2019 12:58   US Abdomen Limited RUQ  Result Date: 09/15/2019 CLINICAL DATA:  Epigastric pain.  Previous cholecystectomy. EXAM: ULTRASOUND ABDOMEN LIMITED RIGHT UPPER QUADRANT COMPARISON:  CT scan dated 09/15/2019 FINDINGS: Gallbladder:  Surgically absent. Common bile duct: Diameter: There is a 5.6 mm stone in the distal common bile duct. Common bile duct is dilated to a diameter 10.3 mm. Liver: 4.3 cm simple cyst in the anterior aspect of the right lobe of the liver. No other focal liver lesions. Within normal limits in parenchymal echogenicity. Portal vein is patent on color Doppler imaging with normal direction of blood flow towards the liver. Other: None. IMPRESSION: 5.6 mm stone in the distal common bile duct. Common bile duct is dilated to a diameter of  10.3 mm. Electronically Signed   By: Lorriane Shire M.D.   On: 09/15/2019 16:07        Scheduled Meds: . [MAR Hold] amLODipine  2.5 mg Oral Daily  . [MAR Hold] calcium-vitamin D  2 tablet Oral Daily  . [MAR Hold] cholecalciferol  1,000 Units Oral Daily  . [MAR Hold] multivitamin with minerals  1 tablet Oral Daily  . [MAR Hold] sertraline  50 mg Oral Daily   Continuous Infusions: . sodium chloride    . sodium chloride 75 mL/hr at 09/17/19 0614     LOS: 2 days        Hosie Poisson, MD [facility]   To contact the attending provider between 7A-7P or the covering provider during after hours 7P-7A, please log into the web site [email or web address] and access using universal [place] password for that web site. If you do not have the password, please call the hospital operator.  09/17/2019, 11:45 AM

## 2019-09-17 NOTE — Transfer of Care (Signed)
Immediate Anesthesia Transfer of Care Note  Patient: Kristine Jones  Procedure(s) Performed: ENDOSCOPIC RETROGRADE CHOLANGIOPANCREATOGRAPHY (ERCP) (N/A ) SPHINCTEROTOMY REMOVAL OF STONES  Patient Location: Endoscopy Unit  Anesthesia Type:General  Level of Consciousness: awake and patient cooperative  Airway & Oxygen Therapy: Patient Spontanous Breathing and Patient connected to nasal cannula oxygen  Post-op Assessment: Report given to RN, Post -op Vital signs reviewed and stable and Patient moving all extremities X 4  Post vital signs: Reviewed and stable  Last Vitals:  Vitals Value Taken Time  BP 177/59 09/17/19 1259  Temp    Pulse 72 09/17/19 1300  Resp 20 09/17/19 1300  SpO2 100 % 09/17/19 1300  Vitals shown include unvalidated device data.  Last Pain:  Vitals:   09/17/19 1123  TempSrc: Temporal  PainSc: 5          Complications: No apparent anesthesia complications

## 2019-09-17 NOTE — Anesthesia Preprocedure Evaluation (Signed)
Anesthesia Evaluation  Patient identified by MRN, date of birth, ID band  Reviewed: Allergy & Precautions, NPO status , Patient's Chart, lab work & pertinent test results  Airway Mallampati: II  TM Distance: >3 FB     Dental   Pulmonary    breath sounds clear to auscultation       Cardiovascular hypertension,  Rhythm:Regular Rate:Normal     Neuro/Psych    GI/Hepatic Neg liver ROS, GERD  ,  Endo/Other    Renal/GU negative Renal ROS     Musculoskeletal   Abdominal   Peds  Hematology   Anesthesia Other Findings   Reproductive/Obstetrics                             Anesthesia Physical Anesthesia Plan  ASA: III  Anesthesia Plan: General   Post-op Pain Management:    Induction: Intravenous  PONV Risk Score and Plan: 3 and Ondansetron and Midazolam  Airway Management Planned: Oral ETT  Additional Equipment:   Intra-op Plan:   Post-operative Plan: Extubation in OR  Informed Consent: I have reviewed the patients History and Physical, chart, labs and discussed the procedure including the risks, benefits and alternatives for the proposed anesthesia with the patient or authorized representative who has indicated his/her understanding and acceptance.     Dental advisory given  Plan Discussed with: CRNA and Anesthesiologist  Anesthesia Plan Comments:         Anesthesia Quick Evaluation

## 2019-09-17 NOTE — Brief Op Note (Signed)
09/15/2019 - 09/17/2019  12:46 PM  PATIENT:  Kristine Jones  69 y.o. female  PRE-OPERATIVE DIAGNOSIS:  CBD stone  POST-OPERATIVE DIAGNOSIS:  * No post-op diagnosis entered *  PROCEDURE:  Procedure(s): ENDOSCOPIC RETROGRADE CHOLANGIOPANCREATOGRAPHY (ERCP) (N/A)  SURGEON:  Surgeon(s) and Role:    Ronnette Juniper, MD - Primary  PHYSICIAN ASSISTANT:   ASSISTANTS: Delmer Islam, Clarnce Flock  ANESTHESIA:   MAC  EBL:  None  BLOOD ADMINISTERED:none  DRAINS: none   LOCAL MEDICATIONS USED:  NONE  SPECIMEN:  No Specimen  DISPOSITION OF SPECIMEN:  N/A  COUNTS:  YES  TOURNIQUET:  * No tourniquets in log *  DICTATION: .Dragon Dictation  PLAN OF CARE: Admit to inpatient   PATIENT DISPOSITION:  PACU - hemodynamically stable.   Delay start of Pharmacological VTE agent (>24hrs) due to surgical blood loss or risk of bleeding: no

## 2019-09-17 NOTE — Anesthesia Procedure Notes (Signed)
Procedure Name: Intubation Date/Time: 09/17/2019 12:16 PM Performed by: Orlie Dakin, CRNA Pre-anesthesia Checklist: Patient identified, Emergency Drugs available, Suction available and Patient being monitored Patient Re-evaluated:Patient Re-evaluated prior to induction Oxygen Delivery Method: Circle system utilized Preoxygenation: Pre-oxygenation with 100% oxygen Induction Type: IV induction Laryngoscope Size: Miller and 3 Grade View: Grade I Tube type: Oral Tube size: 7.0 mm Number of attempts: 1 Airway Equipment and Method: Stylet Placement Confirmation: positive ETCO2,  ETT inserted through vocal cords under direct vision and breath sounds checked- equal and bilateral Secured at: 22 cm Tube secured with: Tape Dental Injury: Teeth and Oropharynx as per pre-operative assessment

## 2019-09-17 NOTE — Interval H&P Note (Signed)
History and Physical Interval Note: 68/female with CBD stone noted on USG , she is post cholecystectomy, has a history of pancreatitis and is here for an ERCP.  09/17/2019 12:02 PM  [place]  has presented today for ERCP, with the diagnosis of CBD stone.  The various methods of treatment have been discussed with the patient and family. After consideration of risks, benefits and other options for treatment, the patient has consented to  Procedure(s): ENDOSCOPIC RETROGRADE CHOLANGIOPANCREATOGRAPHY (ERCP) (N/A) as a surgical intervention.  The patient's history has been reviewed, patient examined, no change in status, stable for surgery.  I have reviewed the patient's chart and labs.  Questions were answered to the patient's satisfaction.     Ronnette Juniper

## 2019-09-17 NOTE — Op Note (Signed)
[facility] Patient Name: Kristine Jones Procedure Date : 09/17/2019 MRN: [id number] Attending MD: Ronnette Juniper , MD Date of Birth: [birth year] CSN: [id number] Age: 69 Admit Type: Inpatient Procedure:                ERCP Indications:              Common bile duct stone(s) Providers:                Ronnette Juniper, MD, Carlyn Reichert, RN, Lazaro Arms,                            Technician Referring MD:              Medicines:                Monitored Anesthesia Care Complications:            No immediate complications. Estimated blood loss:                            None Estimated Blood Loss:     Estimated blood loss: none. Procedure:                Pre-Anesthesia Assessment:                           - Prior to the procedure, a History and Physical                            was performed, and patient medications and                            allergies were reviewed. The patient's tolerance of                            previous anesthesia was also reviewed. The risks                            and benefits of the procedure and the sedation                            options and risks were discussed with the patient.                            All questions were answered, and informed consent                            was obtained. Prior Anticoagulants: The patient has                            taken no previous anticoagulant or antiplatelet                            agents. ASA Grade Assessment: II - A patient with                            mild systemic disease.  After reviewing the risks                            and benefits, the patient was deemed in                            satisfactory condition to undergo the procedure.                           After obtaining informed consent, the scope was                            passed under direct vision. Throughout the                            procedure, the patient's blood pressure, pulse, and                             oxygen saturations were monitored continuously. The                            [id number] Olympus Duodensocope was                            introduced through the mouth, and used to inject                            contrast into and used to inject contrast into the                            bile duct. The ERCP was accomplished without                            difficulty. The patient tolerated the procedure                            well. Scope In: Scope Out: Findings:      The scout film was normal. The esophagus was successfully intubated       under direct vision. The scope was advanced to a normal major papilla in       the descending duodenum without detailed examination of the pharynx,       larynx and associated structures, and upper GI tract. The upper GI tract       was grossly normal.      Scattered erosions were noted in the gastric cavity.      The bile duct was deeply cannulated with the sphincterotome. Contrast       was injected. I personally interpreted the bile duct images. There was       brisk flow of contrast through the ducts. Image quality was excellent.       Contrast extended to the entire biliary tree.      The lower third of the main bile duct contained one stone(white, pearl       like), which was 7 mm in diameter.      The main bile duct was moderately  dilated. The largest diameter was 12       mm.      A cholecystectomy had been performed.      A straight Roadrunner wire was passed into the biliary tree.      A 9 mm biliary sphincterotomy was made with a braided sphincterotome       using ERBE electrocautery. There was no post-sphincterotomy bleeding.      The biliary tree was swept with a 12 mm balloon and then with a 15 mm       balloon starting at the bifurcation, severel balloon sweeps were       performed.      One stone was removed. No stones remained.      The wire was never advanced in to the pancreatic duct and it was        intentionally not injected.      The patient was given rectal indomethacin as she has history of       pancreatitis. Impression:               - The entire main bile duct was moderately dilated.                           - The patient has had a cholecystectomy.                           - Choledocholithiasis was found. Complete removal                            was accomplished by biliary sphincterotomy and                            balloon extraction.                           - A biliary sphincterotomy was performed.                           - The biliary tree was swept and bile duct stone                            were found. Recommendation:           - Full liquid diet today.                           - Advance diet to regular in the evening if patient                            has no abdominal pain. Procedure Code(s):        --- Professional ---                           [phone], Endoscopic retrograde                            cholangiopancreatography (ERCP); with removal of  calculi/debris from biliary/pancreatic duct(s)                           [phone], Endoscopic retrograde                            cholangiopancreatography (ERCP); with                            sphincterotomy/papillotomy Diagnosis Code(s):        --- Professional ---                           Z90.49, Acquired absence of other specified parts                            of digestive tract                           K80.50, Calculus of bile duct without cholangitis                            or cholecystitis without obstruction                           K83.8, Other specified diseases of biliary tract CPT copyright 2019 American Medical Association. All rights reserved. The codes documented in this report are preliminary and upon coder review may  be revised to meet current compliance requirements. Ronnette Juniper, MD 09/17/2019 12:57:03 PM This report has been signed  electronically. Number of Addenda: 0

## 2019-09-17 NOTE — Anesthesia Postprocedure Evaluation (Signed)
Anesthesia Post Note  Patient: Kristine Jones  Procedure(s) Performed: ENDOSCOPIC RETROGRADE CHOLANGIOPANCREATOGRAPHY (ERCP) (N/A ) SPHINCTEROTOMY REMOVAL OF STONES     Patient location during evaluation: Endoscopy Anesthesia Type: General Level of consciousness: awake Pain management: pain level controlled Vital Signs Assessment: post-procedure vital signs reviewed and stable Respiratory status: spontaneous breathing Cardiovascular status: stable Postop Assessment: no apparent nausea or vomiting Anesthetic complications: no    Last Vitals:  Vitals:   09/17/19 1436 09/17/19 1820  BP: (!) 156/72 139/62  Pulse: (!) 59 66  Resp: 15 15  Temp: 37 C 36.4 C  SpO2: 97% 97%    Last Pain:  Vitals:   09/17/19 1820  TempSrc: Oral  PainSc:                  Kensly Bowmer

## 2019-09-18 LAB — COMPREHENSIVE METABOLIC PANEL
ALT: 184 U/L — ABNORMAL HIGH (ref 0–44)
AST: 118 U/L — ABNORMAL HIGH (ref 15–41)
Albumin: 3.2 g/dL — ABNORMAL LOW (ref 3.5–5.0)
Alkaline Phosphatase: 195 U/L — ABNORMAL HIGH (ref 38–126)
Anion gap: 5 (ref 5–15)
BUN: 13 mg/dL (ref 8–23)
CO2: 26 mmol/L (ref 22–32)
Calcium: 9.1 mg/dL (ref 8.9–10.3)
Chloride: 107 mmol/L (ref 98–111)
Creatinine, Ser: 0.62 mg/dL (ref 0.44–1.00)
GFR calc Af Amer: >60 mL/min (ref >60)
GFR calc non Af Amer: >60 mL/min (ref >60)
Glucose, Bld: 202 mg/dL — ABNORMAL HIGH (ref 70–99)
Potassium: 3.3 mmol/L — ABNORMAL LOW (ref 3.5–5.1)
Sodium: 138 mmol/L (ref 135–145)
Total Bilirubin: 1.3 mg/dL — ABNORMAL HIGH (ref 0.3–1.2)
Total Protein: 5.7 g/dL — ABNORMAL LOW (ref 6.5–8.1)

## 2019-09-18 LAB — CBC
HCT: 39.6 % (ref 36.0–46.0)
Hemoglobin: 12.6 g/dL (ref 12.0–15.0)
MCH: 28.6 pg (ref 26.0–34.0)
MCHC: 31.8 g/dL (ref 30.0–36.0)
MCV: 90 fL (ref 80.0–100.0)
Platelets: 246 10*3/uL (ref 150–400)
RBC: 4.4 MIL/uL (ref 3.87–5.11)
RDW: 13 % (ref 11.5–15.5)
WBC: 3.9 10*3/uL — ABNORMAL LOW (ref 4.0–10.5)
nRBC: 0 % (ref 0.0–0.2)

## 2019-09-18 MED ORDER — POTASSIUM CHLORIDE CRYS ER 20 MEQ PO TBCR
40.0000 meq | EXTENDED_RELEASE_TABLET | Freq: Two times a day (BID) | ORAL | Status: DC
  Administered 2019-09-18: 12:00:00 40 meq via ORAL
  Filled 2019-09-18: qty 2

## 2019-09-18 MED ORDER — POTASSIUM CHLORIDE CRYS ER 20 MEQ PO TBCR: 40.0000 meq | EXTENDED_RELEASE_TABLET | Freq: Two times a day (BID) | ORAL | 0 refills | Status: DC

## 2019-09-18 MED ORDER — ONDANSETRON HCL 4 MG PO TABS: 4.0000 mg | ORAL_TABLET | Freq: Four times a day (QID) | ORAL | 0 refills | Status: DC | PRN

## 2019-09-18 NOTE — Progress Notes (Signed)
Subjective: Denies abdominal pain.  Wants her diet to be advanced.  Is requesting to go home.  Objective: Vital signs in last 24 hours: Temp:  [97.6 F (36.4 C)-99 F (37.2 C)] 97.8 F (36.6 C) (05/06 0543) Pulse Rate:  [57-74] 61 (05/06 0543) Resp:  [15-21] 16 (05/06 0543) BP: (121-191)/(52-77) 121/70 (05/06 0543) SpO2:  [94 %-100 %] 95 % (05/06 0543) Weight:  [69.4 kg-73.5 kg] 73.5 kg (05/06 0500) Weight change: -3.7 kg Last BM Date: 09/15/19  PE: Appears comfortable GENERAL: No pallor, no icterus ABDOMEN: Soft, nondistended, nontender EXTREMITIES: No deformity, no edema  Lab Results: Results for orders placed or performed during the hospital encounter of 09/15/19 (from the past 48 hour(s))  CBC     Status: Abnormal   Collection Time: 09/16/19  9:02 AM  Result Value Ref Range   WBC 2.3 (L) 4.0 - 10.5 K/uL   RBC 4.41 3.87 - 5.11 MIL/uL   Hemoglobin 12.6 12.0 - 15.0 g/dL   HCT 40.4 36.0 - 46.0 %   MCV 91.6 80.0 - 100.0 fL   MCH 28.6 26.0 - 34.0 pg   MCHC 31.2 30.0 - 36.0 g/dL   RDW 13.1 11.5 - 15.5 %   Platelets 227 150 - 400 K/uL   nRBC 0.0 0.0 - 0.2 %    Comment: Performed at [facility], [address], [place], [place] [address]  Comprehensive metabolic panel     Status: Abnormal   Collection Time: 09/16/19  9:02 AM  Result Value Ref Range   Sodium 144 135 - 145 mmol/L   Potassium 3.3 (L) 3.5 - 5.1 mmol/L   Chloride 111 98 - 111 mmol/L   CO2 26 22 - 32 mmol/L   Glucose, Bld 103 (H) 70 - 99 mg/dL    Comment: Glucose reference range applies only to samples taken after fasting for at least 8 hours.   BUN 10 8 - 23 mg/dL   Creatinine, Ser 0.61 0.44 - 1.00 mg/dL   Calcium 9.0 8.9 - 10.3 mg/dL   Total Protein 6.2 (L) 6.5 - 8.1 g/dL   Albumin 3.5 3.5 - 5.0 g/dL   AST 425 (H) 15 - 41 U/L   ALT 331 (H) 0 - 44 U/L   Alkaline Phosphatase 173 (H) 38 - 126 U/L   Total Bilirubin 2.8 (H) 0.3 - 1.2 mg/dL   GFR calc non Af Amer >60 >60 mL/min   GFR calc Af Amer >60  >60 mL/min   Anion gap 7 5 - 15    Comment: Performed at [facility] [address], [place] [address]  CBC     Status: Abnormal   Collection Time: 09/17/19  3:25 AM  Result Value Ref Range   WBC 2.6 (L) 4.0 - 10.5 K/uL   RBC 4.16 3.87 - 5.11 MIL/uL   Hemoglobin 12.2 12.0 - 15.0 g/dL   HCT 38.2 36.0 - 46.0 %   MCV 91.8 80.0 - 100.0 fL   MCH 29.3 26.0 - 34.0 pg   MCHC 31.9 30.0 - 36.0 g/dL   RDW 13.2 11.5 - 15.5 %   Platelets 210 150 - 400 K/uL   nRBC 0.0 0.0 - 0.2 %    Comment: Performed at [facility], [place] [address], [place], [place] [address]  Comprehensive metabolic panel     Status: Abnormal   Collection Time: 09/17/19  3:25 AM  Result Value Ref Range   Sodium 142 135 -  145 mmol/L   Potassium 2.9 (L) 3.5 - 5.1 mmol/L   Chloride 106 98 - 111 mmol/L   CO2 25 22 - 32 mmol/L   Glucose, Bld 87 70 - 99 mg/dL    Comment: Glucose reference range applies only to samples taken after fasting for at least 8 hours.   BUN 9 8 - 23 mg/dL   Creatinine, Ser 0.49 0.44 - 1.00 mg/dL   Calcium 8.8 (L) 8.9 - 10.3 mg/dL   Total Protein 5.5 (L) 6.5 - 8.1 g/dL   Albumin 3.2 (L) 3.5 - 5.0 g/dL   AST 233 (H) 15 - 41 U/L   ALT 241 (H) 0 - 44 U/L   Alkaline Phosphatase 171 (H) 38 - 126 U/L   Total Bilirubin 3.1 (H) 0.3 - 1.2 mg/dL   GFR calc non Af Amer >60 >60 mL/min   GFR calc Af Amer >60 >60 mL/min   Anion gap 11 5 - 15    Comment: Performed at [facility], [place] [address] [place], [place] [address]  Magnesium     Status: None   Collection Time: 09/17/19  3:25 AM  Result Value Ref Range   Magnesium 1.9 1.7 - 2.4 mg/dL    Comment: Performed at [facility], [place] [address], [place], [place] [address]  I-STAT, [place] [address]     Status: Abnormal   Collection Time: 09/17/19 11:47 AM  Result Value Ref Range   Sodium 143 135 - 145 mmol/L   Potassium 3.3 (L) 3.5 - 5.1 mmol/L   Chloride 106 98 - 111 mmol/L   BUN 8 8 - 23 mg/dL   Creatinine, Ser 0.50 0.44 - 1.00  mg/dL   Glucose, Bld 82 70 - 99 mg/dL    Comment: Glucose reference range applies only to samples taken after fasting for at least 8 hours.   Calcium, Ion 1.09 (L) 1.15 - 1.40 mmol/L   TCO2 27 22 - 32 mmol/L   Hemoglobin 13.9 12.0 - 15.0 g/dL   HCT 41.0 36.0 - 46.0 %  CBC     Status: Abnormal   Collection Time: 09/18/19  1:29 AM  Result Value Ref Range   WBC 3.9 (L) 4.0 - 10.5 K/uL   RBC 4.40 3.87 - 5.11 MIL/uL   Hemoglobin 12.6 12.0 - 15.0 g/dL   HCT 39.6 36.0 - 46.0 %   MCV 90.0 80.0 - 100.0 fL   MCH 28.6 26.0 - 34.0 pg   MCHC 31.8 30.0 - 36.0 g/dL   RDW 13.0 11.5 - 15.5 %   Platelets 246 150 - 400 K/uL   nRBC 0.0 0.0 - 0.2 %    Comment: Performed at [facility], [place] [address], [place], [place] [address]  Comprehensive metabolic panel     Status: Abnormal   Collection Time: 09/18/19  1:29 AM  Result Value Ref Range   Sodium 138 135 - 145 mmol/L   Potassium 3.3 (L) 3.5 - 5.1 mmol/L   Chloride 107 98 - 111 mmol/L   CO2 26 22 - 32 mmol/L   Glucose, Bld 202 (H) 70 - 99 mg/dL    Comment: Glucose reference range applies only to samples taken after fasting for at least 8 hours.   BUN 13 8 - 23 mg/dL   Creatinine, Ser 0.62 0.44 - 1.00 mg/dL   Calcium 9.1 8.9 - 10.3 mg/dL   Total Protein 5.7 (L) 6.5 - 8.1 g/dL   Albumin 3.2 (L) 3.5 -  5.0 g/dL   AST 118 (H) 15 - 41 U/L   ALT 184 (H) 0 - 44 U/L   Alkaline Phosphatase 195 (H) 38 - 126 U/L   Total Bilirubin 1.3 (H) 0.3 - 1.2 mg/dL   GFR calc non Af Amer >60 >60 mL/min   GFR calc Af Amer >60 >60 mL/min   Anion gap 5 5 - 15    Comment: Performed at [facility] [address], [place], [place] [address]    Studies/Results: DG ERCP BILIARY & PANCREATIC DUCTS  Result Date: 09/17/2019 CLINICAL DATA:  Choledocholithiasis.  ERCP with sphincterotomy EXAM: ERCP TECHNIQUE: Multiple spot images obtained with the fluoroscopic device and submitted for interpretation post-procedure. COMPARISON:  Right upper quadrant abdominal  ultrasound-09/15/2019 FINDINGS: Three spot intraoperative fluoroscopic images of the right upper abdominal quadrant during ERCP are provided for review. Initial image demonstrates an ERCP probe overlying the right upper abdominal quadrant with selective cannulation of the CBD and minimal opacification intrahepatic biliary tree. Subsequent images demonstrate further opacification of the CBD which appears at least moderately dilated. Subsequent images demonstrate insufflation of a balloon within the mid aspect of the CBD with subsequent sweeping and presumed sphincterotomy. There is minimal opacification of the intrahepatic biliary tree which appears at least mildly dilated. IMPRESSION: ERCP with biliary sweeping and presumed sphincterotomy. These images were submitted for radiologic interpretation only. Please see the procedural report for the amount of contrast and the fluoroscopy time utilized. Electronically Signed   By: Sandi Mariscal M.D.   On: 09/17/2019 13:07    Medications: I have reviewed the patient's current medications.  Assessment: Status post ERCP with sphincterotomy and balloon sweep with removal of 1 CBD stone LFTs improving, T bili 1.3, AST 118, ALT 184, ALP 195  Plan: Start regular diet. Okay to discharge home today.  Ronnette Juniper, MD 09/18/2019, 8:01 AM

## 2019-09-18 NOTE — Progress Notes (Signed)
Patient discharged to home with instructions. 

## 2019-09-19 ENCOUNTER — Emergency Department (HOSPITAL_COMMUNITY): Payer: Medicare Other

## 2019-09-19 ENCOUNTER — Other Ambulatory Visit: Payer: Self-pay

## 2019-09-19 ENCOUNTER — Encounter (HOSPITAL_COMMUNITY): Payer: Self-pay

## 2019-09-19 ENCOUNTER — Emergency Department (HOSPITAL_COMMUNITY)
Admission: EM | Admit: 2019-09-19 | Discharge: 2019-09-19 | Disposition: A | Discharge status: Home / Self Care | Payer: Medicare Other | Attending: Emergency Medicine | Admitting: Emergency Medicine

## 2019-09-19 DIAGNOSIS — Z79899 Other long term (current) drug therapy: Secondary | ICD-10-CM | POA: Diagnosis not present

## 2019-09-19 DIAGNOSIS — I4891 Unspecified atrial fibrillation: Secondary | ICD-10-CM

## 2019-09-19 DIAGNOSIS — I10 Essential (primary) hypertension: Secondary | ICD-10-CM | POA: Diagnosis not present

## 2019-09-19 DIAGNOSIS — I480 Paroxysmal atrial fibrillation: Secondary | ICD-10-CM | POA: Insufficient documentation

## 2019-09-19 DIAGNOSIS — R002 Palpitations: Secondary | ICD-10-CM | POA: Diagnosis present

## 2019-09-19 LAB — CBC WITH DIFFERENTIAL/PLATELET
Abs Immature Granulocytes: 0.01 10*3/uL (ref 0.00–0.07)
Basophils Absolute: 0 10*3/uL (ref 0.0–0.1)
Basophils Relative: 1 %
Eosinophils Absolute: 0.1 10*3/uL (ref 0.0–0.5)
Eosinophils Relative: 3 %
HCT: 44.7 % (ref 36.0–46.0)
Hemoglobin: 14 g/dL (ref 12.0–15.0)
Immature Granulocytes: 0 %
Lymphocytes Relative: 28 %
Lymphs Abs: 1 10*3/uL (ref 0.7–4.0)
MCH: 28.9 pg (ref 26.0–34.0)
MCHC: 31.3 g/dL (ref 30.0–36.0)
MCV: 92.4 fL (ref 80.0–100.0)
Monocytes Absolute: 0.3 10*3/uL (ref 0.1–1.0)
Monocytes Relative: 9 %
Neutro Abs: 2.2 10*3/uL (ref 1.7–7.7)
Neutrophils Relative %: 59 %
Platelets: 263 10*3/uL (ref 150–400)
RBC: 4.84 MIL/uL (ref 3.87–5.11)
RDW: 13.4 % (ref 11.5–15.5)
WBC: 3.7 10*3/uL — ABNORMAL LOW (ref 4.0–10.5)
nRBC: 0 % (ref 0.0–0.2)

## 2019-09-19 LAB — COMPREHENSIVE METABOLIC PANEL
ALT: 114 U/L — ABNORMAL HIGH (ref 0–44)
AST: 63 U/L — ABNORMAL HIGH (ref 15–41)
Albumin: 3.2 g/dL — ABNORMAL LOW (ref 3.5–5.0)
Alkaline Phosphatase: 150 U/L — ABNORMAL HIGH (ref 38–126)
Anion gap: 8 (ref 5–15)
BUN: 14 mg/dL (ref 8–23)
CO2: 22 mmol/L (ref 22–32)
Calcium: 7.9 mg/dL — ABNORMAL LOW (ref 8.9–10.3)
Chloride: 116 mmol/L — ABNORMAL HIGH (ref 98–111)
Creatinine, Ser: 0.46 mg/dL (ref 0.44–1.00)
GFR calc Af Amer: >60 mL/min (ref >60)
GFR calc non Af Amer: >60 mL/min (ref >60)
Glucose, Bld: 110 mg/dL — ABNORMAL HIGH (ref 70–99)
Potassium: 3.5 mmol/L (ref 3.5–5.1)
Sodium: 146 mmol/L — ABNORMAL HIGH (ref 135–145)
Total Bilirubin: 1.1 mg/dL (ref 0.3–1.2)
Total Protein: 5.4 g/dL — ABNORMAL LOW (ref 6.5–8.1)

## 2019-09-19 LAB — MAGNESIUM: Magnesium: 1.6 mg/dL — ABNORMAL LOW (ref 1.7–2.4)

## 2019-09-19 MED ORDER — ETOMIDATE 2 MG/ML IV SOLN
INTRAVENOUS | Status: AC | PRN
  Administered 2019-09-19 (×2): 5 mg via INTRAVENOUS

## 2019-09-19 MED ORDER — CALCIUM GLUCONATE-NACL 1-0.675 GM/50ML-% IV SOLN
1.0000 g | Freq: Once | INTRAVENOUS | Status: AC
  Administered 2019-09-19: 1000 mg via INTRAVENOUS
  Filled 2019-09-19: qty 50

## 2019-09-19 MED ORDER — METOPROLOL TARTRATE 25 MG PO TABS: 12.5000 mg | ORAL_TABLET | Freq: Two times a day (BID) | ORAL | Status: DC

## 2019-09-19 MED ORDER — SODIUM CHLORIDE 0.9 % IV SOLN: 1.0000 g | Freq: Once | INTRAVENOUS | Status: DC

## 2019-09-19 MED ORDER — HEPARIN (PORCINE) 25000 UT/250ML-% IV SOLN: 1050.0000 [IU]/h | INTRAVENOUS | Status: DC

## 2019-09-19 MED ORDER — APIXABAN 5 MG PO TABS
5.0000 mg | ORAL_TABLET | Freq: Two times a day (BID) | ORAL | 0 refills | Status: DC
Start: 2019-09-19 — End: 2019-10-21

## 2019-09-19 MED ORDER — IOHEXOL 350 MG/ML SOLN
75.0000 mL | Freq: Once | INTRAVENOUS | Status: AC | PRN
  Administered 2019-09-19: 75 mL via INTRAVENOUS

## 2019-09-19 MED ORDER — ETOMIDATE 2 MG/ML IV SOLN
5.0000 mg | Freq: Once | INTRAVENOUS | Status: AC
  Administered 2019-09-19: 5 mg via INTRAVENOUS
  Filled 2019-09-19: qty 10

## 2019-09-19 MED ORDER — APIXABAN 5 MG PO TABS
5.0000 mg | ORAL_TABLET | Freq: Two times a day (BID) | ORAL | Status: DC
  Filled 2019-09-19: qty 1

## 2019-09-19 MED ORDER — METOPROLOL TARTRATE 25 MG PO TABS
12.5000 mg | ORAL_TABLET | Freq: Two times a day (BID) | ORAL | 0 refills | Status: DC
Start: 2019-09-19 — End: 2019-09-26

## 2019-09-19 MED ORDER — HEPARIN BOLUS VIA INFUSION
4000.0000 [IU] | Freq: Once | INTRAVENOUS | Status: DC
  Filled 2019-09-19: qty 4000

## 2019-09-19 MED ORDER — DILTIAZEM LOAD VIA INFUSION
10.0000 mg | Freq: Once | INTRAVENOUS | Status: AC
  Administered 2019-09-19: 10 mg via INTRAVENOUS
  Filled 2019-09-19: qty 10

## 2019-09-19 MED ORDER — MAGNESIUM SULFATE IN D5W 1-5 GM/100ML-% IV SOLN
1.0000 g | Freq: Once | INTRAVENOUS | Status: AC
  Administered 2019-09-19: 1 g via INTRAVENOUS
  Filled 2019-09-19: qty 100

## 2019-09-19 MED ORDER — DILTIAZEM HCL-DEXTROSE 125-5 MG/125ML-% IV SOLN (PREMIX)
5.0000 mg/h | INTRAVENOUS | Status: DC
  Administered 2019-09-19: 5 mg/h via INTRAVENOUS
  Filled 2019-09-19: qty 125

## 2019-09-19 NOTE — Discharge Instructions (Addendum)
Pick up medications and take as prescribed Follow up with cardiology next week for further evaluation Return to the ED for any worsening symptoms including chest pain, palpitations, shortness of breath, or if you suffer any falls while on the blood thinner/see new source of bleeding

## 2019-09-19 NOTE — ED Provider Notes (Signed)
7:41 PM Cardiology came to see me. States patient is cleared for d/c to home. Pt has been prescribed metoprolol and Eliquis per their direction.  They will arrange follow-up for patient.  Discharge papers printed.  BP (!) 141/65   Pulse 68   Temp 97.8 F (36.6 C) (Oral)   Resp (!) 21   Ht 5\' 9"  (1.753 m)   Wt 72.6 kg   SpO2 97%   BMI 23.63 kg/m    Carlisle Cater, PA-C 09/19/19 1941    Maudie Flakes, MD 09/25/19 0030

## 2019-09-19 NOTE — ED Provider Notes (Addendum)
[facility] Provider Note   CSN: [id number] Arrival date & time: 09/19/19  1113     History Chief Complaint  Patient presents with  . Atrial Fibrillation    [facility] is a 70 y.o. female with PMHx HTN, HLD, hx of aortic valv regurg (last ECHO 2020 with preserved EF) who presents to the ED today via EMS in new onset A fib. Pt reports that around 10 AM she felt sudden onset palpitations prompting her to call EMS. Pt was noted to be in A fib with RVR with HR in the 150s. She was given 500 CC fluids which decreased HR to 120 however it quickly increased to 150 again. 10 mg IV cardizem en route. Pt reports she still feels like her heart is beating fast. She denies chest pain or SOB.   Pt was discharged from the hospital yesterday after presenting on 05/04 with abdominal pain and nausea found to have a 5.6 mm stone in the distal common bile duct with dilated CBD of 10.3 mm. Pt underwent ERCP showing choledocholithiasis.; biliary sphincterotomy and balloon extraction performed. Pt reports feeling overall well after discharge. Was eating breakfast this morning when she began having symptoms.    The history is provided by the patient and medical records.       Past Medical History:  Diagnosis Date  . Abnormal stress test    a. 09/2008, ST depression on ETT @ [place], no further w/u.  . Adenomatous colon polyp   . Anxiety   . Anxiety and depression   . Aortic insufficiency    a. 06/2012 Echo: EF 55-60%, Gr 1 DD, Mild AI, Triv MR, mildly dil LA, nl RV fxn.  Marland Kitchen History of melanoma    Followed with Dr. Tonia Brooms  . HTN (hypertension)   . Hyperlipidemia   . Leukocytosis    Mild  . Migraines   . Osteopenia   . Varicose veins     Patient Active Problem List   Diagnosis Date Noted  . Anxiety and depression   . Choledocholithiasis   . Elevated liver enzymes   . Hypotension   . Osteopenia   . Leukocytopenia 06/23/2014  .  HYPERLIPIDEMIA-MIXED 10/12/2009  . HYPERTENSION, BENIGN 10/12/2009  . AORTIC INSUFFICIENCY 10/12/2009  . ANXIETY DEPRESSION 10/08/2009  . GERD 10/08/2009  . PMS 10/08/2009  . Disorder of bone and cartilage 10/08/2009  . COLONIC POLYPS, ADENOMATOUS, HX OF 10/08/2009  . MIGRAINES, HX OF 10/08/2009    Past Surgical History:  Procedure Laterality Date  . BTL    . CHOLECYSTECTOMY    . DILATION AND CURETTAGE OF UTERUS    . ERCP N/A 09/17/2019   Procedure: ENDOSCOPIC RETROGRADE CHOLANGIOPANCREATOGRAPHY (ERCP);  Surgeon: Ronnette Juniper, MD;  Location: [facility];  Service: Gastroenterology;  Laterality: N/A;  . FEMORAL HERNIA REPAIR    . High endocervical polyp removed    . LIPOMA EXCISION Right 03/23/2015   Procedure: EXCISION LARGE LIPOMA RIGHT UPPER ARM  ;  Surgeon: Youlanda Roys, MD;  Location: [facility];  Service: Plastics;  Laterality: Right;  . MELANOMA EXCISION  2003   right calf  . OVARIAN CYST REMOVAL    . REMOVAL OF STONES  09/17/2019   Procedure: REMOVAL OF STONES;  Surgeon: Ronnette Juniper, MD;  Location[facility]COPY;  Service: Gastroenterology;;  . [facility]  09/17/2019   Procedure: [facility];  Surgeon: Ronnette Juniper, MD;  Location: [facility];  Service: Gastroenterology;;  OB History   No obstetric history on file.     Family History  Problem Relation Age of Onset  . Hypertension Mother   . Migraines Mother   . Hypertension Father   . Lung cancer Father     Social History   Tobacco Use  . Smoking status: Never Smoker  . Smokeless tobacco: Never Used  Substance Use Topics  . Alcohol use: Yes    Alcohol/week: 1.0 standard drinks    Types: 1 Standard drinks or equivalent per week    Comment: 1 glass of wine per week  . Drug use: No    Home Medications Prior to Admission medications   Medication Sig Start Date End Date Taking? Authorizing Provider  amLODipine (NORVASC) 2.5 MG tablet Take 2.5 mg by mouth daily.   Yes [provider]  Calcium Carbonate-Vitamin D (CALCIUM-VITAMIN D3) 600-200 MG-UNIT TABS Take 2 tablets by mouth daily.    Yes [provider]  Cholecalciferol (VITAMIN D) 1000 UNITS capsule Take 1,000 Units by mouth daily.    Yes [provider]  estradiol (ESTRACE) 0.1 MG/GM vaginal cream Place 1 Applicatorful vaginally as needed (vaginal discomfort).    Yes [provider]  fish oil-omega-3 fatty acids 1000 MG capsule Take 1 g by mouth daily.     Yes [provider]  fluticasone (FLONASE) 50 MCG/ACT nasal spray Place 1 spray into both nostrils daily as needed for allergies or rhinitis.   Yes [provider]  multivitamin [facility]) per tablet Take 1 tablet by mouth daily.     Yes [provider]  sertraline (ZOLOFT) 50 MG tablet Take 50 mg by mouth daily.   Yes [provider]  ondansetron (ZOFRAN) 4 MG tablet Take 1 tablet (4 mg total) by mouth every 6 (six) hours as needed for nausea. 09/18/19   Hosie Poisson, MD  potassium chloride SA (KLOR-CON) 20 MEQ tablet Take 2 tablets (40 mEq total) by mouth 2 (two) times daily. 09/18/19   Hosie Poisson, MD  risedronate (ACTONEL) 35 MG tablet Take 35 mg by mouth once a week. 09/05/19   [provider]    Allergies    Indocin [indomethacin], Ibuprofen, Naproxen, Penicillins, Lisinopril, Naproxen sodium, and Triamterene  Review of Systems   Review of Systems  Constitutional: Negative for chills and fever.  Respiratory: Negative for cough and shortness of breath.   Cardiovascular: Positive for palpitations. Negative for chest pain and leg swelling.  Gastrointestinal: Negative for abdominal pain, nausea and vomiting.  All other systems reviewed and are negative.   Physical Exam Updated Vital Signs BP (!) 155/94   Pulse (!) 114   Temp 97.8 F (36.6 C) (Oral)   Resp 17   Ht 5\' 9"  (1.753 m)   Wt 72.6 kg   SpO2 97%   BMI 23.63 kg/m   Physical Exam Vitals and nursing note  reviewed.  Constitutional:      Appearance: She is not ill-appearing or diaphoretic.  HENT:     Head: Normocephalic and atraumatic.  Eyes:     Conjunctiva/sclera: Conjunctivae normal.  Cardiovascular:     Comments: Irregularly irregular with HR 110-130 Pulmonary:     Effort: Pulmonary effort is normal.     Breath sounds: Normal breath sounds. No wheezing, rhonchi or rales.  Abdominal:     Palpations: Abdomen is soft.     Tenderness: There is no abdominal tenderness. There is no guarding or rebound.  Musculoskeletal:     Cervical back:  Neck supple.  Skin:    General: Skin is warm and dry.  Neurological:     Mental Status: She is alert.     ED Results / Procedures / Treatments   Labs (all labs ordered are listed, but only abnormal results are displayed) Labs Reviewed  COMPREHENSIVE METABOLIC PANEL - Abnormal; Notable for the following components:      Result Value   Sodium 146 (*)    Chloride 116 (*)    Glucose, Bld 110 (*)    Calcium 7.9 (*)    Total Protein 5.4 (*)    Albumin 3.2 (*)    AST 63 (*)    ALT 114 (*)    Alkaline Phosphatase 150 (*)    All other components within normal limits  CBC WITH DIFFERENTIAL/PLATELET - Abnormal; Notable for the following components:   WBC 3.7 (*)    All other components within normal limits  MAGNESIUM - Abnormal; Notable for the following components:   Magnesium 1.6 (*)    All other components within normal limits    EKG EKG Interpretation  Date/Time:  Friday Sep 19 2019 11:24:12 EDT Ventricular Rate:  102 PR Interval:    QRS Duration: 98 QT Interval:  340 QTC Calculation: 443 R Axis:   32 Text Interpretation: Atrial fibrillation LVH with secondary repolarization abnormality Anterior Q waves, possibly due to LVH Confirmed by Davonna Belling [phone]) on 09/19/2019 11:30:41 AM   Radiology DG Chest Port 1 View  Result Date: 09/19/2019 CLINICAL DATA:  Palpitations, new atrial fibrillation EXAM: PORTABLE CHEST 1 VIEW  COMPARISON:  2014 FINDINGS: The heart size and mediastinal contours are within normal limits. Both lungs are clear. No pleural effusion. The visualized skeletal structures are unremarkable. IMPRESSION: No acute process in the chest. Electronically Signed   By: Macy Mis M.D.   On: 09/19/2019 14:27    Procedures Procedures (including critical care time)  Medications Ordered in ED Medications  magnesium sulfate IVPB 1 g 100 mL (has no administration in time range)  calcium gluconate 1 g/ 50 mL sodium chloride IVPB (1,000 mg Intravenous New Bag/Given 09/19/19 1456)  diltiazem (CARDIZEM) 1 mg/mL load via infusion 10 mg (has no administration in time range)    And  diltiazem (CARDIZEM) 125 mg in dextrose 5% 125 mL (1 mg/mL) infusion (has no administration in time range)  etomidate (AMIDATE) injection 5 mg (5 mg Intravenous Given 09/19/19 1453)  etomidate (AMIDATE) injection (5 mg Intravenous Given 09/19/19 1440)    ED Course  I have reviewed the triage vital signs and the nursing notes.  Pertinent labs & imaging results that were available during my care of the patient were reviewed by me and considered in my medical decision making (see chart for details).    MDM Rules/Calculators/A&P     CHA2DS2-VASc Score: 30                 69 year old female presents to the ED today with complaint of palpitations and found to be in new onset A. fib with RVR with heart rate in the 150s.  Given Cardizem 10 mg IV in route and on arrival heart rate is in the 100s however during the exam patient's heart rate jumps from the 110s to 130s.  Still currently in A. fib.  She was just recently discharged from the hospital after ERCP with common bile duct stone.  Even recent hospitalization and palpitations will work-up for PE at this time as well as new onset A. fib.  Patient denies any chest pain.  Feel troponins will be of use today.  Will obtain EKG and continue to monitor. Feel patient may be good candidate for  cardioversion given symptoms began this morning at definitive time and no other contraindications.   Dr. Alvino Chapel attending physician discussed case with Dr. Therisa Doyne with GI reports no contraindication 2 days post op from surgery for anticoagulation.   CBC without leukocytosis.  Hemoglobin stable. CMP with sodium 146, chloride 116, and glucose 110. Calcium noted to be 7.9 Mag 1.6 Will replete mag and calcium in the ED today CXR negative  Cardioversion attempted twice however unsuccessful. Started on cardizem drip. Dr. Alvino Chapel discussed case with Trish from cardiology for admission; will have someone come to evaluate patient.   7:33 PM Cardiology has evaluated patient; she converted with cardizem drip and they feel she is stable for discharge home from the ED. Pt given eliquis in the ED and discharged home with same. Also discharged with metoprolol 12.5 mg BID. At shift change Alecia Lemming, PA-C, dispo'd patient.   Final Clinical Impression(s) / ED Diagnoses Final diagnoses:  Atrial fibrillation with RVR [facility])    Rx / [place] Orders ED Discharge Orders    None       Eustaquio Maize, PA-C 09/19/19 1517    Davonna Belling, MD 09/19/19 1650    Eustaquio Maize, PA-C 09/19/19 1934    Davonna Belling, MD 09/24/19 [phone]

## 2019-09-19 NOTE — Progress Notes (Addendum)
Addendum  -Patient converted back to NSR - Heparin D/cd and decision to start on Apixaban as outpatient  - Apixaban 5mg  PO BID  - Pt educated prior to discharge    [place] for Heparin Indication: atrial fibrillation  Allergies  Allergen Reactions  . Indocin [Indomethacin] Other (See Comments)    Stomach upset  . Ibuprofen Other (See Comments)    Grogginess  . Naproxen Other (See Comments)    CAUSE GI [place] OR HAZZY  . Penicillins Other (See Comments)    "flu like symptoms"  . Lisinopril Other (See Comments)  . Naproxen Sodium Other (See Comments)  . Triamterene Other (See Comments)    False gout    Patient Measurements: Height: 5\' 9"  (175.3 cm) Weight: 72.6 kg (160 lb) IBW/kg (Calculated) : 66.2 Heparin Dosing Weight: 72.6 kg  Vital Signs: Temp: 97.8 F (36.6 C) (05/07 1125) Temp Source: Oral (05/07 1125) BP: 140/70 (05/07 1530) Pulse Rate: 95 (05/07 1530)  Labs: Recent Labs    09/17/19 0325 09/17/19 0325 09/17/19 1147 09/17/19 1147 09/18/19 0129 09/19/19 1133  HGB 12.2   < > 13.9   < > 12.6 14.0  HCT 38.2   < > 41.0  --  39.6 44.7  PLT 210  --   --   --  246 263  CREATININE 0.49   < > 0.50  --  0.62 0.46   < > = values in this interval not displayed.    Estimated Creatinine Clearance: 69.4 mL/min (by C-G formula based on SCr of 0.46 mg/dL).   Medical History: Past Medical History:  Diagnosis Date  . Abnormal stress test    a. 09/2008, ST depression on ETT @ [place], no further w/u.  . Adenomatous colon polyp   . Anxiety   . Anxiety and depression   . Aortic insufficiency    a. 06/2012 Echo: EF 55-60%, Gr 1 DD, Mild AI, Triv MR, mildly dil LA, nl RV fxn.  Marland Kitchen History of melanoma    Followed with Dr. Tonia Brooms  . HTN (hypertension)   . Hyperlipidemia   . Leukocytosis    Mild  . Migraines   . Osteopenia   . Varicose veins     Medications:  Scheduled:  . heparin  4,000 Units Intravenous  Once    Assessment: Patient is a 33 yof that presents to the ED with new onset Afib. Pharmacy has been asked to dose heparin at this time as CHADsVASc score is ~ 4. Patient did have recent GI procedure done 2 days prior however GI was okay with AC.  Goal of Therapy:  Heparin level 0.3-0.7 units/ml Monitor platelets by anticoagulation protocol: Yes   Plan:  - Heparin bolus 4000 units IV x 1 dose - Heparin drip @ 1050 units/hr - Heparin level in ~ 6 hours  - Monitor patient for s/s of bleeding and CBC while on heparin   Duanne Limerick PharmD. BCPS  09/19/2019,4:44 PM

## 2019-09-19 NOTE — Consult Note (Signed)
Cardiology Consult   Patient ID: [name] MRN: [id number]; DOB: [birth year]   Admission date: 09/19/2019  Primary Care Provider: Deland Pretty, MD Primary Cardiologist: Lauree Chandler, MD  Primary Electrophysiologist:  None   Chief Complaint:  palpitation  Patient Profile:   Kristine Jones is a 69 y.o. female with HTN, HLD, thoracic aortic aneurysm and history of aortic regurgitation with possible bicuspid aortic valve presented with palpitation and found to be in new atrial fibrillation  History of Present Illness:   [name] is a 69 yo female with PMH of HTN, HLD, thoracic aortic aneurysm and history of aortic regurgitation.  She underwent stress echocardiogram he August 2014 that showed hypokinesis of the apex consistent with ischemia.  Subsequent cardiac catheterization in 2014 showed no evidence of coronary artery disease.  Chest MRA in April 2019 showed 4.1 cm ascending thoracic aorta.  Echocardiogram the October 2019 showed EF 65 to 70%, moderate AI, mild MR, no regional wall motion abnormality.  She was last seen by Richardson Dopp on 03/21/2019 at which time she was doing well.  Echocardiogram was repeated on 04/01/2019 that showed EF 60 to 65%, mild dilatation of the ascending aorta measuring at 38 mm.  More recently, patient was admitted on 09/15/2019 with epigastric pain.  CTA was negative for PE, however does show stable 4.0 cm fusiform aneurysmal dilatation of the ascending thoracic aorta.  She previously had a cholecystectomy about 18 years ago however recent imaging showed a 5.6 mm stone in the distal common bile duct was dilated common bile duct.  She underwent ERCP showing choledocholithiasis, and subsequently underwent biliary sphincterotomy and balloon extraction.  She was discharged home yesterday on 09/18/2019.  Initially after she got home, she felt well other than fatigue.  She woke up this morning without any symptoms.  Around 10 AM this morning, she had a  sudden onset of palpitation which prompted her to come to [facility] for further evaluation.  Although she mentioned she has intermittent palpitation in the past, however it was never this long nor this intense.  While in the ED, she was noted to be in atrial fibrillation with mildly elevated heart rate.  She has been placed on IV diltiazem.  Cardiology was consulted for atrial fibrillation.    Past Medical History:  Diagnosis Date  . Abnormal stress test    a. 09/2008, ST depression on ETT @ [place], no further w/u.  . Adenomatous colon polyp   . Anxiety   . Anxiety and depression   . Aortic insufficiency    a. 06/2012 Echo: EF 55-60%, Gr 1 DD, Mild AI, Triv MR, mildly dil LA, nl RV fxn.  Marland Kitchen History of melanoma    Followed with Dr. Tonia Brooms  . HTN (hypertension)   . Hyperlipidemia   . Leukocytosis    Mild  . Migraines   . Osteopenia   . Varicose veins     Past Surgical History:  Procedure Laterality Date  . BTL    . CHOLECYSTECTOMY    . DILATION AND CURETTAGE OF UTERUS    . ERCP N/A 09/17/2019   Procedure: ENDOSCOPIC RETROGRADE CHOLANGIOPANCREATOGRAPHY (ERCP);  Surgeon: Ronnette Juniper, MD;  Location: [place];  Service: Gastroenterology;  Laterality: N/A;  . FEMORAL HERNIA REPAIR    . High endocervical polyp removed    . LIPOMA EXCISION Right 03/23/2015   Procedure: EXCISION LARGE LIPOMA RIGHT UPPER ARM  ;  Surgeon: Youlanda Roys, MD;  Location: [facility];  Service: Plastics;  Laterality: Right;  . MELANOMA EXCISION  2003   right calf  . OVARIAN CYST REMOVAL    . REMOVAL OF STONES  09/17/2019   Procedure: REMOVAL OF STONES;  Surgeon: Ronnette Juniper, MD;  Location: [facility];  Service: Gastroenterology;;  . [name]  09/17/2019   Procedure: SPHINCTEROTOMY;  Surgeon: Ronnette Juniper, MD;  Location: [facility] ENDOSCOPY;  Service: Gastroenterology;;     Medications Prior to Admission: Prior to Admission medications   Medication Sig Start Date End Date  Taking? Authorizing Provider  amLODipine (NORVASC) 2.5 MG tablet Take 2.5 mg by mouth daily.   Yes [provider]  Calcium Carbonate-Vitamin D (CALCIUM-VITAMIN D3) 600-200 MG-UNIT TABS Take 2 tablets by mouth daily.    Yes [provider]  Cholecalciferol (VITAMIN D) 1000 UNITS capsule Take 1,000 Units by mouth daily.    Yes [provider]  estradiol (ESTRACE) 0.1 MG/GM vaginal cream Place 1 Applicatorful vaginally as needed (vaginal discomfort).    Yes [provider]  fish oil-omega-3 fatty acids 1000 MG capsule Take 1 g by mouth daily.     Yes [provider]  fluticasone (FLONASE) 50 MCG/ACT nasal spray Place 1 spray into both nostrils daily as needed for allergies or rhinitis.   Yes [provider]  multivitamin [facility]) per tablet Take 1 tablet by mouth daily.     Yes [provider]  sertraline (ZOLOFT) 50 MG tablet Take 50 mg by mouth daily.   Yes [provider]  ondansetron (ZOFRAN) 4 MG tablet Take 1 tablet (4 mg total) by mouth every 6 (six) hours as needed for nausea. 09/18/19   Hosie Poisson, MD  potassium chloride SA (KLOR-CON) 20 MEQ tablet Take 2 tablets (40 mEq total) by mouth 2 (two) times daily. 09/18/19   Hosie Poisson, MD  risedronate (ACTONEL) 35 MG tablet Take 35 mg by mouth once a week. 09/05/19   [provider]     Allergies:    Allergies  Allergen Reactions  . Indocin [Indomethacin] Other (See Comments)    Stomach upset  . Ibuprofen Other (See Comments)    Grogginess  . Naproxen Other (See Comments)    CAUSE GI [place] OR HAZZY  . Penicillins Other (See Comments)    "flu like symptoms"  . Lisinopril Other (See Comments)  . Naproxen Sodium Other (See Comments)  . Triamterene Other (See Comments)    False gout    Social History:   Social History   Socioeconomic History  . Marital status: Married    Spouse name: Not on file  . Number of children: Not on file  .  Years of education: Not on file  . Highest education level: Not on file  Occupational History  . Occupation: Futures trader    Comment: Freelance  Tobacco Use  . Smoking status: Never Smoker  . Smokeless tobacco: Never Used  Substance and Sexual Activity  . Alcohol use: Yes    Alcohol/week: 1.0 standard drinks    Types: 1 Standard drinks or equivalent per week    Comment: 1 glass of wine per week  . Drug use: No  . Sexual activity: Not on file  Other Topics Concern  . Not on file  Social History Narrative   Married   1 daughter   Gets regular exercise: Walking, yoga, weight lifting   Social Determinants of Health   Financial Resource Strain:   . Difficulty of Paying Living Expenses:   Food  Insecurity:   . Worried About Charity fundraiser in the Last Year:   . Arboriculturist in the Last Year:   Transportation Needs:   . Film/video editor (Medical):   Marland Kitchen Lack of Transportation (Non-Medical):   Physical Activity:   . Days of Exercise per Week:   . Minutes of Exercise per Session:   Stress:   . Feeling of Stress :   Social Connections:   . Frequency of Communication with Friends and Family:   . Frequency of Social Gatherings with Friends and Family:   . Attends Religious Services:   . Active Member of Clubs or Organizations:   . Attends Archivist Meetings:   Marland Kitchen Marital Status:   Intimate Partner Violence:   . Fear of Current or Ex-Partner:   . Emotionally Abused:   Marland Kitchen Physically Abused:   . Sexually Abused:     Family History:   The patient's family history includes Hypertension in her father and mother; Lung cancer in her father; Migraines in her mother.    ROS:  Please see the history of present illness.  All other ROS reviewed and negative.     Physical Exam/Data:   Vitals:   09/19/19 1435 09/19/19 1438 09/19/19 1450 09/19/19 1530  BP: (!) 143/72 (!) 143/72 (!) 141/95 140/70  Pulse: (!) 131 (!) 131 (!) 110 95  Resp: (!) 22 (!) 21 18 18     Temp:      TempSrc:      SpO2: 96% 99% 96% 94%  Weight:      Height:       No intake or output data in the 24 hours ending 09/19/19 1635 Last 3 Weights 09/19/2019 09/18/2019 09/17/2019  Weight (lbs) 160 lb 162 lb 0.6 oz 153 lb  Weight (kg) 72.576 kg 73.5 kg 69.4 kg     Body mass index is 23.63 kg/m.  General:  Well nourished, well developed, in no acute distress HEENT: normal Lymph: no adenopathy Neck: no JVD Endocrine:  No thryomegaly Vascular: No carotid bruits; FA pulses 2+ bilaterally without bruits  Cardiac:  normal S1, S2; irregularly irregular; no murmur  Lungs:  clear to auscultation bilaterally, no wheezing, rhonchi or rales  Abd: soft, nontender, no hepatomegaly  Ext: no edema Musculoskeletal:  No deformities, BUE and BLE strength normal and equal Skin: warm and dry  Neuro:  CNs 2-12 intact, no focal abnormalities noted Psych:  Normal affect    EKG:  The ECG that was done in the ED and was personally reviewed and demonstrates atrial fibrillation with LVH, no significant ST-T wave changes  Relevant CV Studies:  Echo 04/01/2019 1. Left ventricular ejection fraction, by visual estimation, is 60 to  65%. The left ventricle has normal function. There is no left ventricular  hypertrophy.  2. Global right ventricle has normal systolic function.The right  ventricular size is normal. No increase in right ventricular wall  thickness.  3. Left atrial size was normal.  4. Right atrial size was normal.  5. The aortic valve is tricuspid. Aortic valve regurgitation is moderate.  No evidence of aortic valve sclerosis or stenosis.  6. The mitral valve is normal in structure. Trace mitral valve  regurgitation.  7. The tricuspid valve is normal in structure. Tricuspid valve  regurgitation is trivial.  8. Aortic valve regurgitation is moderate.  9. The pulmonic valve was not well visualized. Pulmonic valve  regurgitation is not visualized.  10. There is mild dilatation of  the ascending aorta measuring 38 mm.  11. The inferior vena cava is normal in size with greater than 50%  respiratory variability, suggesting right atrial pressure of 3 mmHg.  12. The average left ventricular global longitudinal strain is -18.1 %.  13. Compared to prior echo 02/25/18, no significant change    Laboratory Data:  High Sensitivity Troponin:   Recent Labs  Lab 09/15/19 0951 09/15/19 1330  TROPONINIHS 3 5      Chemistry Recent Labs  Lab 09/18/19 0129 09/19/19 1133  NA 138 146*  K 3.3* 3.5  CL 107 116*  CO2 26 22  GLUCOSE 202* 110*  BUN 13 14  CREATININE 0.62 0.46  CALCIUM 9.1 7.9*  GFRNONAA >60 >60  GFRAA >60 >60  ANIONGAP 5 8    Recent Labs  Lab 09/18/19 0129 09/19/19 1133  PROT 5.7* 5.4*  ALBUMIN 3.2* 3.2*  AST 118* 63*  ALT 184* 114*  ALKPHOS 195* 150*  BILITOT 1.3* 1.1   Hematology Recent Labs  Lab 09/18/19 0129 09/19/19 1133  WBC 3.9* 3.7*  RBC 4.40 4.84  HGB 12.6 14.0  HCT 39.6 44.7  MCV 90.0 92.4  MCH 28.6 28.9  MCHC 31.8 31.3  RDW 13.0 13.4  PLT 246 263   BNPNo results for input(s): BNP, PROBNP in the last 168 hours.  DDimer No results for input(s): DDIMER in the last 168 hours.   Radiology/Studies:  CT Angio Chest PE W/Cm &/Or Wo Cm  Result Date: 09/19/2019 CLINICAL DATA:  Acute onset of chest pain and tachycardia. EXAM: CT ANGIOGRAPHY CHEST WITH CONTRAST TECHNIQUE: Multidetector CT imaging of the chest was performed using the standard protocol during bolus administration of intravenous contrast. Multiplanar CT image reconstructions and MIPs were obtained to evaluate the vascular anatomy. CONTRAST:  53mL OMNIPAQUE IOHEXOL 350 MG/ML SOLN COMPARISON:  CT scan 09/15/2019 FINDINGS: Cardiovascular: The heart is upper limits of normal in size. No pericardial effusion. Stable fusiform aneurysmal dilatation of the ascending thoracic aorta measuring a maximum of 4.0 cm. No obvious dissection. The branch vessels are patent. No obvious  coronary artery calcifications. The pulmonary arterial tree is well opacified. No filling defects to suggest pulmonary embolism. Mediastinum/Nodes: No mediastinal or hilar mass or adenopathy. Lungs/Pleura: No significant pulmonary findings. Dependent subpleural atelectasis is noted but no infiltrates, edema or effusions. No worrisome pulmonary lesions or pulmonary nodules. Upper Abdomen: No significant upper abdominal findings. Stable simple hepatic cyst. Musculoskeletal: No breast masses, supraclavicular or axillary adenopathy. The bony thorax is intact. Review of the MIP images confirms the above findings. IMPRESSION: 1. No CT findings for pulmonary embolism. 2. Stable fusiform aneurysmal dilatation of the ascending thoracic aorta measuring a maximum of 4.0 cm. No obvious dissection. Recommend annual imaging followup by CTA or MRA. This recommendation follows 2010 ACCF/AHA/AATS/ACR/ASA/SCA/SCAI/SIR/STS/SVM Guidelines for the Diagnosis and Management of Patients with Thoracic Aortic Disease. Circulation. 2010; [id number]. Aortic aneurysm NOS (ICD10-I71.9) 3. No significant pulmonary findings. Aortic aneurysm NOS (ICD10-I71.9). Aortic aneurysm NOS (ICD10-I71.9). Electronically Signed   By: Marijo Sanes M.D.   On: 09/19/2019 16:23   DG Chest Port 1 View  Result Date: 09/19/2019 CLINICAL DATA:  Palpitations, new atrial fibrillation EXAM: PORTABLE CHEST 1 VIEW COMPARISON:  2014 FINDINGS: The heart size and mediastinal contours are within normal limits. Both lungs are clear. No pleural effusion. The visualized skeletal structures are unremarkable. IMPRESSION: No acute process in the chest. Electronically Signed   By: Macy Mis M.D.   On: 09/19/2019 14:27    Assessment  and Plan:   1. Paroxysmal atrial fibrillation: Patient recently underwent ERCP, sphincterotomy and balloon extraction for choledocholithiasis on 09/17/2019.  She was discharged on 5/6 and came back to the ED on 5/7 with complaint of  palpitations started around 10 AM this morning. Converted to NSR on IV diltiazem in the ED, total duration of afib about 6 hours.  - TSH normal.   - start on Metoprolol tartrate 12.5mg  BID.   - This patients CHA2DS2-VASc Score and unadjusted Ischemic Stroke Rate (% per year) is equal to 4.8 % stroke rate/year from a score of 4  Above score calculated as 1 point each if present [CHF, HTN, DM, Vascular=MI/PAD/Aortic Plaque, Age if 65-74, or Female] Above score calculated as 2 points each if present [Age > 75, or Stroke/TIA/TE]  - discussed with Dr. Debara Pickett, plan to start on Eliquis 5mg  BID  - may discharge from ED and followup with cardiology office in 2 weeks  2. Moderate AI: stable on last echo in Nov 2020  3. Thoracic aortic aneurysm: around 4 cm on CTA  4. HTN: on amlodipine at home, add low dose metoprolol tartrate 12.5 mg BID  5. HLD: on fish oil   For questions or updates, please contact [place] Please consult [email or web address] for contact info under        Hilbert Corrigan, [place]  09/19/2019 4:35 PM

## 2019-09-19 NOTE — ED Triage Notes (Signed)
Patient arrives with ems for new onset a. Fib/rvr. Per ems, the patient started having palpitations around 1000 and called ems. The patient does have a history of a leaking aortic valve, but no history of A.Fib. Patient heart rate was to be in the 150's on the scene with ems. She was given a 500cc fluid bolus that decreased her heart rate to the 120s. Heart rate then spiked back up to the 150's and she was given 10mg  of IV cardizem en route.   Patient was also recently discharge for bile duct issue.

## 2019-09-19 NOTE — ED Provider Notes (Signed)
  Physical Exam  BP 140/70   Pulse 95   Temp 97.8 F (36.6 C) (Oral)   Resp 18   Ht 5\' 9"  (1.753 m)   Wt 72.6 kg   SpO2 94%   BMI 23.63 kg/m   Physical Exam  ED Course/Procedures     .Cardioversion  Date/Time: 09/19/2019 4:46 PM Performed by: Davonna Belling, MD Authorized by: Davonna Belling, MD   Consent:    Consent obtained:  Written   Consent given by:  Patient   Risks discussed:  Cutaneous burn, death, induced arrhythmia and pain   Alternatives discussed:  No treatment, anti-coagulation medication and rate-control medication Pre-procedure details:    Cardioversion basis:  Elective   Rhythm:  Atrial fibrillation Patient sedated: Yes. Refer to sedation procedure documentation for details of sedation.  Attempt one:    Cardioversion mode:  Synchronous   Waveform:  Biphasic   Shock (Joules):  150   Shock outcome:  No change in rhythm Attempt two:    Cardioversion mode:  Synchronous   Waveform:  Biphasic   Shock (Joules):  200   Shock outcome:  No change in rhythm Post-procedure details:    Patient status:  Awake   Patient tolerance of procedure:  Tolerated well, no immediate complications .Sedation  Date/Time: 09/19/2019 4:46 PM Performed by: Davonna Belling, MD Authorized by: Davonna Belling, MD   Consent:    Consent obtained:  Written   Consent given by:  Patient   Risks discussed:  Allergic reaction, dysrhythmia, inadequate sedation, nausea, vomiting, respiratory compromise necessitating ventilatory assistance and intubation and prolonged hypoxia resulting in organ damage   Alternatives discussed:  Analgesia without sedation Universal protocol:    Immediately prior to procedure a time out was called: yes     Patient identity confirmation method:  Arm band and verbally with patient Indications:    Procedure necessitating sedation performed by:  Physician performing sedation Pre-sedation assessment:    Time since last food or drink:  4 hrs   ASA  classification: class 2 - patient with mild systemic disease     Neck mobility: normal     Mouth opening:  2 finger widths   Thyromental distance:  3 finger widths   Mallampati score:  II - soft palate, uvula, fauces visible   Pre-sedation assessments completed and reviewed: airway patency, cardiovascular function, hydration status, mental status, pain level and respiratory function   Immediate pre-procedure details:    Reassessment: Patient reassessed immediately prior to procedure     Reviewed: vital signs     Verified: bag valve mask available, emergency equipment available, intubation equipment available, IV patency confirmed and oxygen available   Procedure details (see MAR for exact dosages):    Preoxygenation:  Nasal cannula   Sedation:  Etomidate   Intended level of sedation: deep   Intra-procedure monitoring:  Blood pressure monitoring, cardiac monitor, continuous pulse oximetry, frequent LOC assessments and frequent vital sign checks   Intra-procedure events: none     Total Provider sedation time (minutes):  10 Post-procedure details:    Attendance: Constant attendance by certified staff until patient recovered     Recovery: Patient returned to pre-procedure baseline     Patient is stable for discharge or admission: yes     Patient tolerance:  Tolerated well, no immediate complications           Davonna Belling, MD 09/19/19 [phone]

## 2019-09-19 NOTE — Discharge Summary (Signed)
Physician Discharge Summary  Kristine Jones [id number] DOB: 1950/11/24 DOA: 09/15/2019  PCP: Deland Pretty, MD  Admit date: 09/15/2019 Discharge date: 09/18/2019  Admitted From: Home Disposition:  Home.   Recommendations for Outpatient Follow-up:  1. Follow up with PCP in 1-2 weeks 2. Please obtain BMP/CBC in one week Please follow up with GI as needed.  Please follow up with PCP regarding the aortic aneurysm.   Discharge Condition:stable.  CODE STATUS:full code.  Diet recommendation: Heart Healthy    Brief/Interim Summary: 69 year old lady with prior history of hypertension, hyperlipidemia, anxiety and depression presented to ED with nausea, abdominal pain.  Patient reports she had a cholecystectomy done about 18 years ago.  Imaging shows 5.6 mm stone in the distal common bile duct with dilated CBD of 10.3 mm.  Liver enzymes elevated but improving when compared to yesterday.  Patient is seen and examined today she reports her nausea is better and abdominal pain has improved. She is underwent ERCP showing Choledocholithiasis. Complete removal was accomplished by biliary sphincterotomy and balloon extraction. A biliary sphincterotomy was performed. She was started on a diet and discharged home .  Discharge Diagnoses:  Principal Problem:   Choledocholithiasis Active Problems:   HYPERTENSION, BENIGN   Anxiety and depression   Elevated liver enzymes   Osteopenia  Abdominal pain secondary to choledocholithiasis: Underwent ERCP showing Choledocholithiasis. Complete removal was accomplished by biliary sphincterotomy and balloon extraction. A biliary sphincterotomy was performed. Pt is started ona diet and advanced as tolerated.  Liver enzymes are improving . She denies any abd pain, nausea or vomiting on discharge.    Mild leukopenia probably secondary to acute illness. Continue to monitor.    Essential hypertension Blood pressure parameters are stable.  Resume home  medications.   Hypokalemia Replaced.    Thyroid nodule on CT TSH within normal limits Recommend outpatient follow-up.   Ascending thoracic aortic aneurysm measuring about 4 cm. Recommend outpatient follow-up with an ultrasound yearly.  3.9 cm hypodense, fluid attenuating right hepatic mass consistent with a cyst unchanged compared with MRI of the thoracic aorta dated 09/03/2017.   Chronic anxiety and depression Continue Zoloft.  Discharge Instructions  Discharge Instructions    Diet - low sodium heart healthy   Complete by: As directed    Discharge instructions   Complete by: As directed    Please follow up with PCP in one week.  Please follow up with gastroenterology as needed.     Allergies as of 09/18/2019      Reactions   Indocin [indomethacin] Other (See Comments)   Stomach upset   Ibuprofen Other (See Comments)   Grogginess   Naproxen Other (See Comments)   CAUSE GI PROBLEMS,SLEEPING OR HAZZY   Penicillins Other (See Comments)   "flu like symptoms"   Lisinopril Other (See Comments)   Naproxen Sodium Other (See Comments)   Triamterene Other (See Comments)   False gout      Medication List    STOP taking these medications   alendronate 70 MG tablet Commonly known as: FOSAMAX   ibandronate 150 MG tablet Commonly known as: BONIVA     TAKE these medications   amLODipine 2.5 MG tablet Commonly known as: NORVASC Take 2.5 mg by mouth daily.   Calcium-Vitamin D3 600-200 MG-UNIT Tabs Take 2 tablets by mouth daily.   estradiol 0.1 MG/GM vaginal cream Commonly known as: ESTRACE Place 1 Applicatorful vaginally as needed (vaginal discomfort).   fish oil-omega-3 fatty acids 1000 MG capsule Take 1 g  by mouth daily.   fluticasone 50 MCG/ACT nasal spray Commonly known as: FLONASE Place 1 spray into both nostrils daily as needed for allergies or rhinitis.   multivitamin per tablet Take 1 tablet by mouth daily.   ondansetron 4 MG  tablet Commonly known as: ZOFRAN Take 1 tablet (4 mg total) by mouth every 6 (six) hours as needed for nausea.   potassium chloride SA 20 MEQ tablet Commonly known as: KLOR-CON Take 2 tablets (40 mEq total) by mouth 2 (two) times daily.   risedronate 35 MG tablet Commonly known as: ACTONEL Take 35 mg by mouth once a week.   sertraline 50 MG tablet Commonly known as: ZOLOFT Take 50 mg by mouth daily.   Vitamin D 1000 units capsule Take 1,000 Units by mouth daily.      Follow-up Information    Deland Pretty, MD. Schedule an appointment as soon as possible for a visit in 1 week(s).   Specialty: Internal Medicine Contact information: [address] [place] [address] [phone]        Burnell Blanks, MD .   Specialty: Cardiology Contact information: [place] [place] [address] [phone]          Allergies  Allergen Reactions  . Indocin [Indomethacin] Other (See Comments)    Stomach upset  . Ibuprofen Other (See Comments)    Grogginess  . Naproxen Other (See Comments)    CAUSE GI [place] OR HAZZY  . Penicillins Other (See Comments)    "flu like symptoms"  . Lisinopril Other (See Comments)  . Naproxen Sodium Other (See Comments)  . Triamterene Other (See Comments)    False gout    Consultations:  Gastroenterology.    Procedures/Studies: DG ERCP BILIARY & PANCREATIC DUCTS  Result Date: 09/17/2019 CLINICAL DATA:  Choledocholithiasis.  ERCP with sphincterotomy EXAM: ERCP TECHNIQUE: Multiple spot images obtained with the fluoroscopic device and submitted for interpretation post-procedure. COMPARISON:  Right upper quadrant abdominal ultrasound-09/15/2019 FINDINGS: Three spot intraoperative fluoroscopic images of the right upper abdominal quadrant during ERCP are provided for review. Initial image demonstrates an ERCP probe overlying the right upper abdominal quadrant with selective cannulation of  the CBD and minimal opacification intrahepatic biliary tree. Subsequent images demonstrate further opacification of the CBD which appears at least moderately dilated. Subsequent images demonstrate insufflation of a balloon within the mid aspect of the CBD with subsequent sweeping and presumed sphincterotomy. There is minimal opacification of the intrahepatic biliary tree which appears at least mildly dilated. IMPRESSION: ERCP with biliary sweeping and presumed sphincterotomy. These images were submitted for radiologic interpretation only. Please see the procedural report for the amount of contrast and the fluoroscopy time utilized. Electronically Signed   By: Sandi Mariscal M.D.   On: 09/17/2019 13:07   CT Angio Chest/Abd/Pel for Dissection W and/or Wo Contrast  Result Date: 09/15/2019 CLINICAL DATA:  Chest pain, low back pain, no thoracic aortic aneurysm repair. EXAM: CT ANGIOGRAPHY CHEST, ABDOMEN AND PELVIS TECHNIQUE: Non-contrast CT of the chest was initially obtained. Multidetector CT imaging through the chest, abdomen and pelvis was performed using the standard protocol during bolus administration of intravenous contrast. Multiplanar reconstructed images and MIPs were obtained and reviewed to evaluate the vascular anatomy. CONTRAST:  134mL OMNIPAQUE IOHEXOL 350 MG/ML SOLN COMPARISON:  MRI thoracic aorta 09/03/2017 FINDINGS: CTA CHEST FINDINGS Cardiovascular: Preferential opacification of the thoracic aorta. Ascending thoracic aortic aneurysm measuring 4 cm in maximum diameter. The aortic root remains normal in size.  Normal 3 vessel are aortic arch anatomy. Normal pulmonary arteries. Normal heart size. No pericardial effusion. Mediastinum/Nodes: No enlarged mediastinal, hilar, or axillary lymph nodes. 2 mm right thyroid hypodense nodule. Trachea is normal. Mild esophageal wall thickening as can be seen with esophagitis. Lungs/Pleura: Lungs are clear. No pleural effusion or pneumothorax. Musculoskeletal: No chest  wall abnormality. No acute or significant osseous findings. Review of the MIP images confirms the above findings. CTA ABDOMEN AND PELVIS FINDINGS VASCULAR Aorta: Normal caliber aorta without aneurysm, dissection, vasculitis or significant stenosis. Celiac: Patent without evidence of aneurysm, dissection, vasculitis or significant stenosis. SMA: Patent without evidence of aneurysm, dissection, vasculitis or significant stenosis. Renals: Both renal arteries are patent without evidence of aneurysm, dissection, vasculitis, fibromuscular dysplasia or significant stenosis. IMA: Patent without evidence of aneurysm, dissection, vasculitis or significant stenosis. Inflow: Patent without evidence of aneurysm, dissection, vasculitis or significant stenosis. Scattered mild atherosclerotic plaque. Veins: No obvious venous abnormality within the limitations of this arterial phase study. Review of the MIP images confirms the above findings. NON-VASCULAR Hepatobiliary: 3.9 cm hypodense, fluid attenuating right hepatic mass consistent with a cyst unchanged compared with MRI of the thoracic aorta dated 09/03/2017. Mild intrahepatic biliary ductal dilatation likely related to a post cholecystectomy state. Gallbladder surgically absent. Pancreas: Unremarkable. No pancreatic ductal dilatation or surrounding inflammatory changes. Spleen: Normal in size without focal abnormality. Adrenals/Urinary Tract: Adrenal glands are unremarkable. Kidneys are normal, without renal calculi, focal lesion, or hydronephrosis. Bladder is unremarkable. Stomach/Bowel: Stomach is within normal limits. Appendix appears normal. No evidence of bowel wall thickening, distention, or inflammatory changes. Lymphatic: No lymphadenopathy. Reproductive: Uterus and bilateral adnexa are unremarkable. Other: No abdominal wall hernia or abnormality. No abdominopelvic ascites. Musculoskeletal: No acute osseous abnormality. No aggressive osseous lesion. Moderate  osteoarthritis of the right hip. Mild osteoarthritis of the left hip. 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 Review of the MIP images confirms the above findings. IMPRESSION: 1. No aortic dissection. 2. Ascending thoracic aortic aneurysm measuring 4 cm in maximum diameter. 3. Mild esophageal wall thickening as can be seen with esophagitis. 4. 2 mm right thyroid nodule. Not clinically significant; no follow-up imaging recommended (ref: J Am Coll Radiol. 2015 Feb;12(2): 143-50). Electronically Signed   By: Kathreen Devoid   On: 09/15/2019 12:58   US Abdomen Limited RUQ  Result Date: 09/15/2019 CLINICAL DATA:  Epigastric pain.  Previous cholecystectomy. EXAM: ULTRASOUND ABDOMEN LIMITED RIGHT UPPER QUADRANT COMPARISON:  CT scan dated 09/15/2019 FINDINGS: Gallbladder: Surgically absent. Common bile duct: Diameter: There is a 5.6 mm stone in the distal common bile duct. Common bile duct is dilated to a diameter 10.3 mm. Liver: 4.3 cm simple cyst in the anterior aspect of the right lobe of the liver. No other focal liver lesions. Within normal limits in parenchymal echogenicity. Portal vein is patent on color Doppler imaging with normal direction of blood flow towards the liver. Other: None. IMPRESSION: 5.6 mm stone in the distal common bile duct. Common bile duct is dilated to a diameter of 10.3 mm. Electronically Signed   By: Lorriane Shire M.D.   On: 09/15/2019 16:07     Subjective: No new complaints.   Discharge Exam: Vitals:   09/18/19 0543 09/18/19 0828  BP: 121/70 129/64  Pulse: 61 74  Resp: 16   Temp: 97.8 F (36.6 C) 98.5 F (36.9 C)  SpO2: 95% 98%   Vitals:   09/18/19 0137 09/18/19 0500 09/18/19 0543 09/18/19 0828  BP: (!) 121/52  121/70  129/64  Pulse: 64  61 74  Resp: 17  16   Temp: 98.1 F (36.7 C)  97.8 F (36.6 C) 98.5 F (36.9 C)  TempSrc: Oral  Oral Oral  SpO2: 94%  95% 98%  Weight:  73.5 kg    Height:        General: Pt is alert, awake, not in acute  distress Cardiovascular: RRR, S1/S2 +, no rubs, no gallops Respiratory: CTA bilaterally, no wheezing, no rhonchi Abdominal: Soft, NT, ND, bowel sounds + Extremities: no edema, no cyanosis    The results of significant diagnostics from this hospitalization (including imaging, microbiology, ancillary and laboratory) are listed below for reference.     Microbiology: Recent Results (from the past 240 hour(s))  Respiratory Panel by RT PCR (Flu A&B, Covid) - Nasopharyngeal Swab     Status: None   Collection Time: 09/15/19  5:25 PM   Specimen: Nasopharyngeal Swab  Result Value Ref Range Status   SARS Coronavirus 2 by RT PCR NEGATIVE NEGATIVE Final    Comment: (NOTE) SARS-CoV-2 target nucleic acids are NOT DETECTED. The SARS-CoV-2 RNA is generally detectable in upper respiratoy specimens during the acute phase of infection. The lowest concentration of SARS-CoV-2 viral copies this assay can detect is 131 copies/mL. A negative result does not preclude SARS-Cov-2 infection and should not be used as the sole basis for treatment or other patient management decisions. A negative result may occur with  improper specimen collection/handling, submission of specimen other than nasopharyngeal swab, presence of viral mutation(s) within the areas targeted by this assay, and inadequate number of viral copies (<131 copies/mL). A negative result must be combined with clinical observations, patient history, and epidemiological information. The expected result is Negative. Fact Sheet for Patients:  PinkCheek.be Fact Sheet for Healthcare Providers:  GravelBags.it This test is not yet ap proved or cleared by the [place] FDA and  has been authorized for detection and/or diagnosis of SARS-CoV-2 by FDA under an Emergency Use Authorization (EUA). This EUA will remain  in effect (meaning this test can be used) for the duration of the COVID-19  declaration under Section 564(b)(1) of the Act, 21 U.S.C. section 360bbb-3(b)(1), unless the authorization is terminated or revoked sooner.    Influenza A by PCR NEGATIVE NEGATIVE Final   Influenza B by PCR NEGATIVE NEGATIVE Final    Comment: (NOTE) The Xpert Xpress SARS-CoV-2/FLU/RSV assay is intended as an aid in  the diagnosis of influenza from Nasopharyngeal swab specimens and  should not be used as a sole basis for treatment. Nasal washings and  aspirates are unacceptable for Xpert Xpress SARS-CoV-2/FLU/RSV  testing. Fact Sheet for Patients: PinkCheek.be Fact Sheet for Healthcare Providers: GravelBags.it This test is not yet approved or cleared by the [place] FDA and  has been authorized for detection and/or diagnosis of SARS-CoV-2 by  FDA under an Emergency Use Authorization (EUA). This EUA will remain  in effect (meaning this test can be used) for the duration of the  Covid-19 declaration under Section 564(b)(1) of the Act, 21  U.S.C. section 360bbb-3(b)(1), unless the authorization is  terminated or revoked. Performed at [facility], [place] [address] [place], [place] [address]      Labs: BNP (last 3 results) No results for input(s): BNP in the last 8760 hours. Basic Metabolic Panel: Recent Labs  Lab 09/15/19 0951 09/16/19 0902 09/17/19 0325 09/17/19 1147 09/18/19 0129  NA 139 144 142 143 138  K 3.5 3.3* 2.9* 3.3* 3.3*  CL  101 111 106 106 107  CO2 30 26 25   --  26  GLUCOSE 117* 103* 87 82 202*  BUN 15 10 9 8 13   CREATININE 0.60 0.61 0.49 0.50 0.62  CALCIUM 8.9 9.0 8.8*  --  9.1  MG 1.8  --  1.9  --   --    Liver Function Tests: Recent Labs  Lab 09/15/19 0951 09/16/19 0902 09/17/19 0325 09/18/19 0129  AST 164* 425* 233* 118*  ALT 64* 331* 241* 184*  ALKPHOS 93 173* 171* 195*  BILITOT 1.1 2.8* 3.1* 1.3*  PROT 5.9* 6.2* 5.5* 5.7*  ALBUMIN 3.5 3.5 3.2* 3.2*   Recent Labs  Lab  09/15/19 0951  LIPASE 26   No results for input(s): AMMONIA in the last 168 hours. CBC: Recent Labs  Lab 09/15/19 0951 09/16/19 0902 09/17/19 0325 09/17/19 1147 09/18/19 0129  WBC 4.8 2.3* 2.6*  --  3.9*  NEUTROABS 3.6  --   --   --   --   HGB 12.6 12.6 12.2 13.9 12.6  HCT 40.1 40.4 38.2 41.0 39.6  MCV 92.6 91.6 91.8  --  90.0  PLT 220 227 210  --  246   Cardiac Enzymes: No results for input(s): CKTOTAL, CKMB, CKMBINDEX, TROPONINI in the last 168 hours. BNP: Invalid input(s): POCBNP CBG: No results for input(s): GLUCAP in the last 168 hours. D-Dimer No results for input(s): DDIMER in the last 72 hours. Hgb A1c No results for input(s): HGBA1C in the last 72 hours. Lipid Profile No results for input(s): CHOL, HDL, LDLCALC, TRIG, CHOLHDL, LDLDIRECT in the last 72 hours. Thyroid function studies No results for input(s): TSH, T4TOTAL, T3FREE, THYROIDAB in the last 72 hours.  Invalid input(s): FREET3 Anemia work up No results for input(s): VITAMINB12, FOLATE, FERRITIN, TIBC, IRON, RETICCTPCT in the last 72 hours. Urinalysis    Component Value Date/Time   COLORURINE AMBER (A) 09/15/2019 2115   APPEARANCEUR CLEAR 09/15/2019 2115   LABSPEC 1.026 09/15/2019 2115   PHURINE 7.0 09/15/2019 2115   GLUCOSEU NEGATIVE 09/15/2019 2115   HGBUR NEGATIVE 09/15/2019 2115   BILIRUBINUR NEGATIVE 09/15/2019 2115   KETONESUR 20 (A) 09/15/2019 2115   PROTEINUR NEGATIVE 09/15/2019 2115   NITRITE NEGATIVE 09/15/2019 2115   LEUKOCYTESUR NEGATIVE 09/15/2019 2115   Sepsis Labs Invalid input(s): PROCALCITONIN,  WBC,  LACTICIDVEN Microbiology Recent Results (from the past 240 hour(s))  Respiratory Panel by RT PCR (Flu A&B, Covid) - Nasopharyngeal Swab     Status: None   Collection Time: 09/15/19  5:25 PM   Specimen: Nasopharyngeal Swab  Result Value Ref Range Status   SARS Coronavirus 2 by RT PCR NEGATIVE NEGATIVE Final    Comment: (NOTE) SARS-CoV-2 target nucleic acids are NOT  DETECTED. The SARS-CoV-2 RNA is generally detectable in upper respiratoy specimens during the acute phase of infection. The lowest concentration of SARS-CoV-2 viral copies this assay can detect is 131 copies/mL. A negative result does not preclude SARS-Cov-2 infection and should not be used as the sole basis for treatment or other patient management decisions. A negative result may occur with  improper specimen collection/handling, submission of specimen other than nasopharyngeal swab, presence of viral mutation(s) within the areas targeted by this assay, and inadequate number of viral copies (<131 copies/mL). A negative result must be combined with clinical observations, patient history, and epidemiological information. The expected result is Negative. Fact Sheet for Patients:  PinkCheek.be Fact Sheet for Healthcare Providers:  GravelBags.it This test is not yet ap proved or  cleared by the [place] and  has been authorized for detection and/or diagnosis of SARS-CoV-2 by FDA under an Emergency Use Authorization (EUA). This EUA will remain  in effect (meaning this test can be used) for the duration of the COVID-19 declaration under Section 564(b)(1) of the Act, 21 U.S.C. section 360bbb-3(b)(1), unless the authorization is terminated or revoked sooner.    Influenza A by PCR NEGATIVE NEGATIVE Final   Influenza B by PCR NEGATIVE NEGATIVE Final    Comment: (NOTE) The Xpert Xpress SARS-CoV-2/FLU/RSV assay is intended as an aid in  the diagnosis of influenza from Nasopharyngeal swab specimens and  should not be used as a sole basis for treatment. Nasal washings and  aspirates are unacceptable for Xpert Xpress SARS-CoV-2/FLU/RSV  testing. Fact Sheet for Patients: PinkCheek.be Fact Sheet for Healthcare Providers: GravelBags.it This test is not yet approved or cleared  by the [place] FDA and  has been authorized for detection and/or diagnosis of SARS-CoV-2 by  FDA under an Emergency Use Authorization (EUA). This EUA will remain  in effect (meaning this test can be used) for the duration of the  Covid-19 declaration under Section 564(b)(1) of the Act, 21  U.S.C. section 360bbb-3(b)(1), unless the authorization is  terminated or revoked. Performed at [facility], [place] [address], [place], [place] [address]      Time coordinating discharge: Over 32 minutes.   SIGNED:   Hosie Poisson, MD  [facility]

## 2019-09-23 NOTE — Progress Notes (Signed)
Cardiology Office Note:    Date:  09/24/2019   ID:  Kristine Jones, DOB 05/01/1951, MRN [id number]  PCP:  Pharr, Walter, MD  Cardiologist:  Christopher McAlhany, MD  Electrophysiologist:  None   Referring MD: Pharr, Walter, MD   Chief Complaint:  Hospitalization Follow-up (AFib w RVR)    Patient Profile:    Kristine Jones is a 68 y.o. female with:   Aortic insufficiency  ? Possible bicuspid AoV ? Echocardiogram 10/19: EF 65-70, Gr 1 DD, mod AI, mild MR  Paroxysmal atrial fibrillation  Noted several days after admx w/ choledocholithiasis   DCCV initially failed in ED  CHA2DS2-VASc=4 (age x 1, female, HTN, vasc dz) >> Apixaban started (will need to consider whether to continue long term or not)   Hypertension   Hyperlipidemia   Thoracic aortic aneurysm   Cardiac catheterization 2014: no CAD   Prior CV studies: Chest CTA 09/19/2019 Asc TAA 4 cm  Echocardiogram 04/01/2019 EF 60-65, normal RVSF, mod AI, trace MR, trivial TR, Asc Aorta 38 mm, GLS -18.1%  Echo 02/25/18 EF 65-70, no RWMA, Gr 1 DD, mod AI, mild MR, mild LAE, normal RVSF, mild TR  Chest MRA 09/03/17 IMPRESSION: VASCULAR 1. Mild fusiform aneurysmal dilatation of the tubular portion of the ascending thoracic aorta with a maximal diameter of 4.1 cm. No evidence of aortic root dilatation or effacement of the sino-tubular junction. NON-VASCULAR 1. No acute abnormality or clinically significant finding. 2. Incidental note is made of a 3.9 cm simple hepatic cyst.  Cardiac Catheterization8/22/14 Impression: 1. No angiographic evidence of CAD 2. Normal LV systolic function  Stress Echo 12/20/12 Impressions - Stress echocardiogram with no chest pain; nondiagnostic, upsloping ST depression; there appears to be hypokinesis of the apex with peak exercise on apical 4 chamber view consistent with ischemia.  History of Present Illness:    Ms. Arther was last seen in clinic in 03/2019.   She was admitted in 09/2019 with choledocholithiasis tx with ERCP, biliary sphincterotomy and balloon extraction.  She was then seen in the ED 09/19/2019 with AFib with RVR.  She had an unsuccessful DCCV.  She was seen by Cardiology and placed on Apixaban and Metoprolol for rate control.  She returns for follow up.   She is here alone.  She has not really had any recurrent palpitations since leaving the hospital.  She has not had chest pain, shortness of breath, syncope, leg swelling.  Past Medical History:  Diagnosis Date  . Abnormal stress test    a. 09/2008, ST depression on ETT @ [facility], no further w/u. // Cath 8/14: no CAD  . Adenomatous colon polyp   . Anxiety and depression   . Aortic insufficiency    a. 06/2012 Echo: EF 55-60%, Gr 1 DD, Mild AI, Triv MR, mildly dil LA, nl RV fxn. // Echo 03/2019: EF 60-65, normal RVSF, mod AI, trace MR, trivial TR, Asc Aorta 38 mm, GLS -18.1%  . History of melanoma    Followed with Dr. Gruber  . HTN (hypertension)   . Hyperlipidemia   . Leukocytosis    Mild  . Migraines   . Osteopenia   . Paroxysmal atrial fibrillation (HCC)    admx in 09/2019 w/ choledocho lithiasis >> ERCP // several days later presented to ED with AF with RVR >> DCCV in ED unsuccessful; CHADS-VASc 4 >> Apixaban started for at least 1 month - ?? long term Rx  . Thoracic aortic aneurysm (HCC)      MRA 4/19: 4.1 cm // CT 09/2019: 4 cm  . Varicose veins     Current Medications: Current Meds  Medication Sig  . amLODipine (NORVASC) 2.5 MG tablet Take 2.5 mg by mouth daily.  . apixaban (ELIQUIS) 5 MG TABS tablet Take 1 tablet (5 mg total) by mouth 2 (two) times daily.  . Calcium Carbonate-Vitamin D (CALCIUM-VITAMIN D3) 600-200 MG-UNIT TABS Take 2 tablets by mouth daily.   . Cholecalciferol (VITAMIN D) 1000 UNITS capsule Take 1,000 Units by mouth daily.   . estradiol (ESTRACE) 0.1 MG/GM vaginal cream Place 1 Applicatorful vaginally as needed (vaginal discomfort).   . fish  oil-omega-3 fatty acids 1000 MG capsule Take 1 g by mouth daily.    . fluticasone (FLONASE) 50 MCG/ACT nasal spray Place 1 spray into both nostrils daily as needed for allergies or rhinitis.  . metoprolol tartrate (LOPRESSOR) 25 MG tablet Take 0.5 tablets (12.5 mg total) by mouth 2 (two) times daily.  . multivitamin (THERAGRAN) per tablet Take 1 tablet by mouth daily.    . sertraline (ZOLOFT) 50 MG tablet Take 50 mg by mouth daily.     Allergies:   Indocin [indomethacin], Ibuprofen, Lisinopril, Naproxen, Naproxen sodium, Penicillins, and Triamterene   Social History   Tobacco Use  . Smoking status: Never Smoker  . Smokeless tobacco: Never Used  Substance Use Topics  . Alcohol use: Yes    Alcohol/week: 1.0 standard drinks    Types: 1 Standard drinks or equivalent per week    Comment: 1 glass of wine per week  . Drug use: No     Family Hx: The patient's family history includes Hypertension in her father and mother; Lung cancer in her father; Migraines in her mother.  Review of Systems  Hematologic/Lymphatic: Bruises/bleeds easily.     EKGs/Labs/Other Test Reviewed:    EKG:  EKG is  ordered today.  The ekg ordered today demonstrates sinus bradycardia, HR 58, normal axis, septal Q waves, QTC 406  Recent Labs: 09/15/2019: TSH 2.539 09/19/2019: ALT 114; BUN 14; Creatinine, Ser 0.46; Hemoglobin 14.0; Magnesium 1.6; Platelets 263; Potassium 3.5; Sodium 146   Recent Lipid Panel No results found for: CHOL, TRIG, HDL, CHOLHDL, LDLCALC, LDLDIRECT  Physical Exam:    VS:  BP 138/80   Pulse (!) 58   Ht 5' 9" (1.753 m)   Wt 158 lb 12.8 oz (72 kg)   SpO2 99%   BMI 23.45 kg/m     Wt Readings from Last 3 Encounters:  09/24/19 158 lb 12.8 oz (72 kg)  09/19/19 160 lb (72.6 kg)  09/18/19 162 lb 0.6 oz (73.5 kg)     Constitutional:      Appearance: Healthy appearance. Not in distress.  Neck:     Thyroid: No thyromegaly.     Vascular: JVD normal.  Pulmonary:     Effort: Pulmonary  effort is normal.     Breath sounds: No wheezing. No rales.  Cardiovascular:     Normal rate. Regular rhythm. Normal S1. Normal S2.     Murmurs: There is no murmur.  Edema:    Peripheral edema absent.  Abdominal:     Palpations: Abdomen is soft. There is no hepatomegaly.  Skin:    General: Skin is warm and dry.  Neurological:     Mental Status: Alert and oriented to person, place and time.     Cranial Nerves: Cranial nerves are intact.       ASSESSMENT & PLAN:    1. PAF (  paroxysmal atrial fibrillation) (HCC) She presented to the emergency room with atrial fibrillation with rapid ventricular rate.  She is currently back in sinus rhythm.  CHA2DS2-VASc=4 (vasc dz, age x 1, female, HTN).  She had an attempted cardioversion in the emergency room.  At a minimum, she will need to remain on continuous anticoagulation for at least 1 month post cardioversion.  It is somewhat unusual that her atrial fibrillation started several days after her presentation with choledocholithiasis.  However, it is certainly possible that her atrial fibrillation was related to her acute illness.  I will bring her back in 6 to 8 weeks.  We can decide at that appointment if we should continue her on long-term anticoagulation.  We may want to consider a 30-day event monitor.  2. Nonrheumatic aortic valve insufficiency Moderate by most recent echocardiogram.  She will need continued surveillance with annual echocardiograms.  3. Thoracic aortic aneurysm without rupture (HCC) 4.0 cm by recent CT.  She will need follow-up CT in May 2022.  4. Elevated liver enzymes She had elevated LFTs related to choledocholithiasis.  She saw gastroenterology recently and needs follow-up LFTs.  I will obtain a c-Met today and send a copy to her gastroenterologist (Dr. Magod).  5. Hypokalemia K+ 3.5 in the hospital.  Obtain follow up CMET.   Dispo:  Return in about 8 weeks (around 11/19/2019) for Routine Follow Up w/ Dr. McAlhany, in  person.   Medication Adjustments/Labs and Tests Ordered: Current medicines are reviewed at length with the patient today.  Concerns regarding medicines are outlined above.  Tests Ordered: Orders Placed This Encounter  Procedures  . Comprehensive metabolic panel  . EKG 12-Lead   Medication Changes: No orders of the defined types were placed in this encounter.   Signed, Scott Weaver, PA-C  09/24/2019 5:00 PM    [facility] [address], [place], [place]  [address] Phone: [phone]; Fax: [phone]    

## 2019-09-24 ENCOUNTER — Ambulatory Visit: Payer: Medicare Other | Admitting: Physician Assistant

## 2019-09-24 ENCOUNTER — Other Ambulatory Visit: Payer: Self-pay

## 2019-09-24 ENCOUNTER — Encounter: Payer: Self-pay | Admitting: Physician Assistant

## 2019-09-24 VITALS — BP 138/80 | HR 58 | Ht 69.0 in | Wt 158.8 lb

## 2019-09-24 DIAGNOSIS — I480 Paroxysmal atrial fibrillation: Secondary | ICD-10-CM

## 2019-09-24 DIAGNOSIS — I712 Thoracic aortic aneurysm, without rupture, unspecified: Secondary | ICD-10-CM

## 2019-09-24 DIAGNOSIS — R748 Abnormal levels of other serum enzymes: Secondary | ICD-10-CM

## 2019-09-24 DIAGNOSIS — I351 Nonrheumatic aortic (valve) insufficiency: Secondary | ICD-10-CM | POA: Diagnosis not present

## 2019-09-24 DIAGNOSIS — E876 Hypokalemia: Secondary | ICD-10-CM

## 2019-09-24 NOTE — Patient Instructions (Addendum)
Medication Instructions:   Your physician recommends that you continue on your current medications as directed. Please refer to the Current Medication list given to you today.  *If you need a refill on your cardiac medications before your next appointment, please call your pharmacy*  Lab Work:  You will have labs drawn today: CMET  If you have labs (blood work) drawn today and your tests are completely normal, you will receive your results only by: Marland Kitchen MyChart Message (if you have MyChart) OR . A paper copy in the mail If you have any lab test that is abnormal or we need to change your treatment, we will call you to review the results.  Testing/Procedures:  None ordered today  Follow-Up:  On 11/12/19 at 12:00PM with Lauree Chandler, MD

## 2019-09-25 LAB — COMPREHENSIVE METABOLIC PANEL
ALT: 61 IU/L — ABNORMAL HIGH (ref 0–32)
AST: 30 IU/L (ref 0–40)
Albumin/Globulin Ratio: 2 (ref 1.2–2.2)
Albumin: 4.6 g/dL (ref 3.8–4.8)
Alkaline Phosphatase: 146 IU/L — ABNORMAL HIGH (ref 39–117)
BUN/Creatinine Ratio: 24 (ref 12–28)
BUN: 15 mg/dL (ref 8–27)
Bilirubin Total: 0.6 mg/dL (ref 0.0–1.2)
CO2: 30 mmol/L — ABNORMAL HIGH (ref 20–29)
Calcium: 10 mg/dL (ref 8.7–10.3)
Chloride: 102 mmol/L (ref 96–106)
Creatinine, Ser: 0.63 mg/dL (ref 0.57–1.00)
GFR calc Af Amer: 106 mL/min/{1.73_m2} (ref >59)
GFR calc non Af Amer: 92 mL/min/{1.73_m2} (ref >59)
Globulin, Total: 2.3 g/dL (ref 1.5–4.5)
Glucose: 75 mg/dL (ref 65–99)
Potassium: 4.6 mmol/L (ref 3.5–5.2)
Sodium: 144 mmol/L (ref 134–144)
Total Protein: 6.9 g/dL (ref 6.0–8.5)

## 2019-09-26 MED ORDER — METOPROLOL TARTRATE 25 MG PO TABS: 12.5000 mg | ORAL_TABLET | Freq: Every day | ORAL | 0 refills | Status: DC

## 2019-10-03 MED ORDER — METOPROLOL TARTRATE 25 MG PO TABS
12.5000 mg | ORAL_TABLET | ORAL | 3 refills | Status: DC | PRN
Start: 2019-10-03 — End: 2019-11-12

## 2019-10-10 ENCOUNTER — Other Ambulatory Visit: Payer: Self-pay

## 2019-10-10 ENCOUNTER — Telehealth: Payer: Self-pay | Admitting: Radiology

## 2019-10-10 DIAGNOSIS — R001 Bradycardia, unspecified: Secondary | ICD-10-CM

## 2019-10-10 NOTE — Telephone Encounter (Signed)
Enrolled patient for a 14 day Zio Monitor to be mailed to patients home  

## 2019-10-10 NOTE — Telephone Encounter (Signed)
See note from patient. Question if symptoms are recurrent AFib or if she is having symptomatic bradycardia. Let's get a 14 day Zio patch (Live) monitor on her to evaluate.  Use Dx paroxysmal atrial fibrillation. Richardson Dopp, PA-C    10/10/2019 2:15 PM

## 2019-10-20 ENCOUNTER — Ambulatory Visit (INDEPENDENT_AMBULATORY_CARE_PROVIDER_SITE_OTHER): Payer: Medicare Other

## 2019-10-20 ENCOUNTER — Other Ambulatory Visit: Payer: Self-pay | Admitting: Cardiovascular Disease

## 2019-10-20 DIAGNOSIS — R001 Bradycardia, unspecified: Secondary | ICD-10-CM | POA: Diagnosis not present

## 2019-10-20 NOTE — Telephone Encounter (Signed)
New Message     *STAT* If patient is at the pharmacy, call can be transferred to refill team.   1. Which medications need to be refilled? (please list name of each medication and dose if known) apixaban (ELIQUIS) 5 MG TABS tablet(Expired)  2. Which pharmacy/location (including street and city if local pharmacy) is medication to be sent to?  [place], [place]  3. Do they need a 30 day or 90 day supply?  30 or 90    Pt is out of medication

## 2019-10-21 MED ORDER — APIXABAN 5 MG PO TABS: 5.0000 mg | ORAL_TABLET | Freq: Two times a day (BID) | ORAL | 5 refills | Status: DC

## 2019-10-21 NOTE — Telephone Encounter (Signed)
Pt last saw Richardson Dopp, PA on 09/24/19, last labs 09/24/19 Creat 0.63, age 69, weight 72kg, based on specified criteria pt is on appropriate dosage of Eliquis 5mg  BID.  Will refill rx.

## 2019-11-12 ENCOUNTER — Ambulatory Visit: Payer: Medicare Other | Admitting: Cardiovascular Disease

## 2019-11-12 ENCOUNTER — Other Ambulatory Visit: Payer: Self-pay

## 2019-11-12 ENCOUNTER — Encounter: Payer: Self-pay | Admitting: Cardiovascular Disease

## 2019-11-12 VITALS — BP 146/86 | HR 80 | Wt 158.6 lb

## 2019-11-12 DIAGNOSIS — I10 Essential (primary) hypertension: Secondary | ICD-10-CM | POA: Diagnosis not present

## 2019-11-12 DIAGNOSIS — I351 Nonrheumatic aortic (valve) insufficiency: Secondary | ICD-10-CM | POA: Diagnosis not present

## 2019-11-12 DIAGNOSIS — I712 Thoracic aortic aneurysm, without rupture, unspecified: Secondary | ICD-10-CM

## 2019-11-12 DIAGNOSIS — I480 Paroxysmal atrial fibrillation: Secondary | ICD-10-CM

## 2019-11-12 NOTE — Patient Instructions (Signed)
Medication Instructions:  No changes *If you need a refill on your cardiac medications before your next appointment, please call your pharmacy*   Lab Work: none If you have labs (blood work) drawn today and your tests are completely normal, you will receive your results only by: Marland Kitchen MyChart Message (if you have MyChart) OR . A paper copy in the mail If you have any lab test that is abnormal or we need to change your treatment, we will call you to review the results.   Testing/Procedures: ECHO IN NOVEMBER Your physician has requested that you have an echocardiogram. Echocardiography is a painless test that uses sound waves to create images of your heart. It provides your doctor with information about the size and shape of your heart and how well your heart's chambers and valves are working. This procedure takes approximately one hour. There are no restrictions for this procedure.   Follow-Up: At [facility], you and your health needs are our priority.  As part of our continuing mission to provide you with exceptional heart care, we have created designated Provider Care Teams.  These Care Teams include your primary Cardiologist (physician) and Advanced Practice Providers (APPs -  Physician Assistants and Nurse Practitioners) who all work together to provide you with the care you need, when you need it.  Your next appointment:   6 month(s)  The format for your next appointment:   In Person  Provider:   You may see Lauree Chandler, MD or one of the following Advanced Practice Providers on your designated Care Team:    Melina Copa, PA-C  Ermalinda Barrios, PA-C   Other Instructions

## 2019-11-12 NOTE — Progress Notes (Signed)
Chief Complaint  Patient presents with  . Follow-up    HTN   History of Present Illness: 69 yo female with history of atrial fibrillation, aortic insufficiency with possible bicuspid aortic valve, HTN, HLD and thoracic aortic aneurysm who is here today for cardiac follow up. Patient was previously followed by Dr. Glade Lloyd. She established with me in 2011. She has known mild AI. She was seen in our office 12/06/12 by Richardson Dopp, PA-C and noted syncopal events with bradycardia. No exertional discomfort but she did describe resting left sided chest discomfort. Stress echo on 12/20/12 with upsloping ST depression with exercise and apical hypokinesis, worrisome for ischemia. I arranged a cardiac cath on 01/03/13 which showed no evidence of coronary artery disease. Echo November 2020 with LVEF=60-65%. Moderate AI. She was admitted to [facility] 2021 with choledocholithiasis treated with ERCP, biliary sphincterotomy and balloon extraction. She was seen in the ED 09/19/19 with atrial fib with RVR. DCCV unsuccessful. She was seen by the cardiology team and started on metoprolol and Eliquis. CHADS VASC score 4 (vasc dis, age x 1, female, HTN). Her blood pressure was low and she felt fatigued so   She is here today for follow up. The patient denies any chest pain, dyspnea, palpitations, lower extremity edema, orthopnea, PND, dizziness, near syncope or syncope. Overall feeling well.    Primary Care Physician: Deland Pretty, MD  Past Medical History:  Diagnosis Date  . Abnormal stress test    a. 09/2008, ST depression on ETT @ [facility], no further w/u. // Cath 8/14: no CAD  . Adenomatous colon polyp   . Anxiety and depression   . Aortic insufficiency    a. 06/2012 Echo: EF 55-60%, Gr 1 DD, Mild AI, Triv MR, mildly dil LA, nl RV fxn. // Echo 03/2019: EF 60-65, normal RVSF, mod AI, trace MR, trivial TR, Asc Aorta 38 mm, GLS -18.1%  . History of melanoma    Followed with Dr. Tonia Brooms  . HTN (hypertension)    . Hyperlipidemia   . Leukocytosis    Mild  . Migraines   . Osteopenia   . Paroxysmal atrial fibrillation (HCC)    admx in 09/2019 w/ choledocho lithiasis >> ERCP // several days later presented to ED with AF with RVR >> DCCV in ED unsuccessful; CHADS-VASc 4 >> Apixaban started for at least 1 month - ?? long term Rx  . Thoracic aortic aneurysm (Thor)    MRA 4/19: 4.1 cm // CT 09/2019: 4 cm  . Varicose veins     Past Surgical History:  Procedure Laterality Date  . BTL    . CHOLECYSTECTOMY    . DILATION AND CURETTAGE OF UTERUS    . ERCP N/A 09/17/2019   Procedure: ENDOSCOPIC RETROGRADE CHOLANGIOPANCREATOGRAPHY (ERCP);  Surgeon: Ronnette Juniper, MD;  Location: [facility];  Service: Gastroenterology;  Laterality: N/A;  . FEMORAL HERNIA REPAIR    . High endocervical polyp removed    . LIPOMA EXCISION Right 03/23/2015   Procedure: EXCISION LARGE LIPOMA RIGHT UPPER ARM  ;  Surgeon: Youlanda Roys, MD;  Location: [facility];  Service: Plastics;  Laterality: Right;  . MELANOMA EXCISION  2003   right calf  . OVARIAN CYST REMOVAL    . REMOVAL OF STONES  09/17/2019   Procedure: REMOVAL OF STONES;  Surgeon: Ronnette Juniper, MD;  Location: [facility] ENDOSCOPY;  Service: Gastroenterology;;  . [name]  09/17/2019   Procedure: [name];  Surgeon: Ronnette Juniper, MD;  Location: [facility] ENDOSCOPY;  Service: Gastroenterology;;    Current Outpatient Medications  Medication Sig Dispense Refill  . amLODipine (NORVASC) 2.5 MG tablet Take 2.5 mg by mouth daily.    Marland Kitchen apixaban (ELIQUIS) 5 MG TABS tablet Take 1 tablet (5 mg total) by mouth 2 (two) times daily. 60 tablet 5  . Calcium Carbonate-Vitamin D (CALCIUM-VITAMIN D3) 600-200 MG-UNIT TABS Take 2 tablets by mouth daily.     . Cholecalciferol (VITAMIN D) 1000 UNITS capsule Take 1,000 Units by mouth daily.     Marland Kitchen estradiol (ESTRACE) 0.1 MG/GM vaginal cream Place 1 Applicatorful vaginally as needed (vaginal discomfort).     . fish oil-omega-3 fatty  acids 1000 MG capsule Take 1 g by mouth daily.      . fluticasone (FLONASE) 50 MCG/ACT nasal spray Place 1 spray into both nostrils daily as needed for allergies or rhinitis.    . multivitamin (THERAGRAN) per tablet Take 1 tablet by mouth daily.      . risedronate (ACTONEL) 150 MG tablet Take 150 mg by mouth every 30 (thirty) days.    Marland Kitchen sertraline (ZOLOFT) 50 MG tablet Take 50 mg by mouth daily.     No current facility-administered medications for this visit.    Allergies  Allergen Reactions  . Indocin [Indomethacin] Other (See Comments)    Stomach upset  . Ibuprofen Other (See Comments)    Grogginess  . Lisinopril Other (See Comments)  . Naproxen Other (See Comments)    CAUSE GI [place] OR HAZZY  . Naproxen Sodium Other (See Comments)  . Penicillins Other (See Comments)    "flu like symptoms"  . Triamterene Other (See Comments)    False gout    Social History   Socioeconomic History  . Marital status: Married    Spouse name: Not on file  . Number of children: Not on file  . Years of education: Not on file  . Highest education level: Not on file  Occupational History  . Occupation: Futures trader    Comment: Freelance  Tobacco Use  . Smoking status: Never Smoker  . Smokeless tobacco: Never Used  Vaping Use  . Vaping Use: Never used  Substance and Sexual Activity  . Alcohol use: Yes    Alcohol/week: 1.0 standard drink    Types: 1 Standard drinks or equivalent per week    Comment: 1 glass of wine per week  . Drug use: No  . Sexual activity: Not on file  Other Topics Concern  . Not on file  Social History Narrative   Married   1 daughter   Gets regular exercise: Walking, yoga, weight lifting   Social Determinants of Health   Financial Resource Strain:   . Difficulty of Paying Living Expenses:   Food Insecurity:   . Worried About Charity fundraiser in the Last Year:   . Arboriculturist in the Last Year:   Transportation Needs:   . Lexicographer (Medical):   Marland Kitchen Lack of Transportation (Non-Medical):   Physical Activity:   . Days of Exercise per Week:   . Minutes of Exercise per Session:   Stress:   . Feeling of Stress :   Social Connections:   . Frequency of Communication with Friends and Family:   . Frequency of Social Gatherings with Friends and Family:   . Attends Religious Services:   . Active Member of Clubs or Organizations:   . Attends Archivist Meetings:   Marland Kitchen Marital  Status:   Intimate Partner Violence:   . Fear of Current or Ex-Partner:   . Emotionally Abused:   Marland Kitchen Physically Abused:   . Sexually Abused:     Family History  Problem Relation Age of Onset  . Hypertension Mother   . Migraines Mother   . Hypertension Father   . Lung cancer Father     Review of Systems:  As stated in the HPI and otherwise negative.   BP (!) 146/86   Pulse 80   Wt 158 lb 9.6 oz (71.9 kg)   SpO2 98%   BMI 23.42 kg/m   Physical Examination:  General: Well developed, well nourished, NAD  HEENT: OP clear, mucus membranes moist  SKIN: warm, dry. No rashes. Neuro: No focal deficits  Musculoskeletal: Muscle strength 5/5 all ext  Psychiatric: Mood and affect normal  Neck: No JVD, no carotid bruits, no thyromegaly, no lymphadenopathy.  Lungs:Clear bilaterally, no wheezes, rhonci, crackles Cardiovascular: Regular rate and rhythm. No murmurs, gallops or rubs. Abdomen:Soft. Bowel sounds present. Non-tender.  Extremities: No lower extremity edema. Pulses are 2 + in the bilateral DP/PT.  Echo November 2020:   1. Left ventricular ejection fraction, by visual estimation, is 60 to  65%. The left ventricle has normal function. There is no left ventricular  hypertrophy.  2. Global right ventricle has normal systolic function.The right  ventricular size is normal. No increase in right ventricular wall  thickness.  3. Left atrial size was normal.  4. Right atrial size was normal.  5. The aortic valve is  tricuspid. Aortic valve regurgitation is moderate.  No evidence of aortic valve sclerosis or stenosis.  6. The mitral valve is normal in structure. Trace mitral valve  regurgitation.  7. The tricuspid valve is normal in structure. Tricuspid valve  regurgitation is trivial.  8. Aortic valve regurgitation is moderate.  9. The pulmonic valve was not well visualized. Pulmonic valve  regurgitation is not visualized.  10. There is mild dilatation of the ascending aorta measuring 38 mm.  11. The inferior vena cava is normal in size with greater than 50%  respiratory variability, suggesting right atrial pressure of 3 mmHg.  12. The average left ventricular global longitudinal strain is -18.1 %.  13. Compared to prior echo 02/25/18, no significant change   Cardiac cath 01/03/13: 1. No angiographic evidence of CAD  2. Normal LV systolic function  EKG:  EKG is not ordered today. The ekg ordered today demonstrates   Recent Labs: 09/15/2019: TSH 2.539 09/19/2019: Hemoglobin 14.0; Magnesium 1.6; Platelets 263 09/24/2019: ALT 61; BUN 15; Creatinine, Ser 0.63; Potassium 4.6; Sodium 144   Lipid Panel No results found for: CHOL, TRIG, HDL, CHOLHDL, VLDL, LDLCALC, LDLDIRECT   Wt Readings from Last 3 Encounters:  11/12/19 158 lb 9.6 oz (71.9 kg)  09/24/19 158 lb 12.8 oz (72 kg)  09/19/19 160 lb (72.6 kg)     Other studies Reviewed: Additional studies/ records that were reviewed today include: . Review of the above records demonstrates:    Assessment and Plan:   1. HTN: Managed in primary care. BP controlled at home. Continue current therapy  2. Aortic insufficiency: Moderate by echo in November 2020.  Will repeat echo in November 2021  3. Thoracic aortic aneurysm: Stable at 4.0 cm by chest CTA May 2021. I reviewed that report today  4. PAF: Sinus today. Her atrial fibrillation may have been due to her acute GI illness. She wishes to remain on Eliquis and I agree  with that. She cannot  tolerate metoprolol.  Her cardiac monitor is pending. Will review when available. If she has recurrence of atrial fib, will need to consider Cardizem or Flecainide.   Current medicines are reviewed at length with the patient today.  The patient does not have concerns regarding medicines.  The following changes have been made:  no change  Labs/ tests ordered today include:   Orders Placed This Encounter  Procedures  . ECHOCARDIOGRAM COMPLETE   Disposition:   FU with me in 6  months  Signed, Lauree Chandler, MD 11/12/2019 12:46 PM    [address], [place], [place]  [address] Phone: [phone]; Fax: [phone]

## 2019-11-13 ENCOUNTER — Other Ambulatory Visit: Payer: Self-pay | Admitting: *Deleted

## 2019-11-13 DIAGNOSIS — R001 Bradycardia, unspecified: Secondary | ICD-10-CM

## 2019-11-21 MED ORDER — DILTIAZEM HCL ER COATED BEADS 120 MG PO CP24
120.0000 mg | ORAL_CAPSULE | Freq: Every day | ORAL | 3 refills | Status: DC
Start: 2019-11-21 — End: 2020-03-22

## 2019-11-21 NOTE — Telephone Encounter (Signed)
Amlodipine D/C'd. Cardizem 120 mg QD called in to preferred pharmacy.

## 2019-12-12 ENCOUNTER — Telehealth: Payer: Self-pay | Admitting: Cardiovascular Disease

## 2019-12-12 NOTE — Telephone Encounter (Signed)
Patient c/o Palpitations:  High priority if patient c/o lightheadedness, shortness of breath, or chest pain  1) How long have you had palpitations/irregular HR/ Afib? Are you having the symptoms now? Patient states she is in afib   2) Are you currently experiencing lightheadedness, SOB or CP? No  3) Do you have a history of afib (atrial fibrillation) or irregular heart rhythm? Yes  4) Have you checked your BP or HR? (document readings if available):  BP: 144/82  HR:129  5) Are you experiencing any other symptoms? No

## 2019-12-12 NOTE — Telephone Encounter (Signed)
Agree. Thank you. Kristine Jones

## 2019-12-12 NOTE — Telephone Encounter (Signed)
Called patient who reports she has a funny feeling in her chest this morning. States "something didn't feel quite right" when she woke up this morning, was not awakened by symptoms. Describes palpitations and admits to being anxious about heart rate and BP. Took cardizem 30 minutes ago. HR currently 84 bpm, was 129 bpm earlier this morning. I advised her that monitoring for 5-7 days would be very beneficial in helping [place] determine the appropriate dose of cardizem. I asked her to record HR and BP 1-2 hours after taking cardizem daily and send that information through MyChart next week. I encouraged good hydration and nutrition  as she admits she traveled yesterday and may not have had enough water, avoiding excess caffeine and alcohol, and good sleep habits. She asks if she can call back to report an additional elevated HR and I gave her parameters to follow in addition to symptoms that would be alarming. I encouraged her not to be alarmed by isolated incidences of tachycardia or hypertension and that getting a week's worth of information would be most beneficial to rate and BP control. Advised her to call back anytime with additional questions or concerns and she thanked me for the call.

## 2019-12-24 NOTE — Telephone Encounter (Signed)
Has history of afib.  Started Cardizem 120 mg 11/21/19  Stopped amlodipine. Called office on 7/30 describing palpitations and was instructed to record BP/HR in case change in Cardizem dose is recommended.

## 2020-01-01 ENCOUNTER — Other Ambulatory Visit: Payer: Self-pay | Admitting: Interventional Radiology

## 2020-01-01 DIAGNOSIS — I83813 Varicose veins of bilateral lower extremities with pain: Secondary | ICD-10-CM

## 2020-01-15 ENCOUNTER — Other Ambulatory Visit: Payer: Self-pay

## 2020-01-15 MED ORDER — APIXABAN 5 MG PO TABS: 5.0000 mg | ORAL_TABLET | Freq: Two times a day (BID) | ORAL | 1 refills | Status: DC

## 2020-01-15 NOTE — Telephone Encounter (Signed)
Faxed 90 day rx refill request for Kristine Jones received via fax from [place].  Pt last saw Dr Angelena Form 11/12/19, last labs 09/24/19 Creat 0.63, age 69, weight 71.9kg, based on specified criteria pt is on appropriate dosage of Kristine Jones 5mg  BID.  Will refill rx.

## 2020-02-26 ENCOUNTER — Other Ambulatory Visit: Payer: Self-pay | Admitting: Interventional Radiology

## 2020-02-26 ENCOUNTER — Ambulatory Visit
Admission: RE | Admit: 2020-02-26 | Discharge: 2020-02-26 | Disposition: A | Discharge status: Home / Self Care | Payer: Medicare Other | Source: Ambulatory Visit | Attending: Interventional Radiology | Admitting: Interventional Radiology

## 2020-02-26 DIAGNOSIS — I83813 Varicose veins of bilateral lower extremities with pain: Secondary | ICD-10-CM

## 2020-02-26 NOTE — Progress Notes (Signed)
Patient ID: Kristine Jones, female   DOB: 1951/01/19, 68 y.o.   MRN: [id number]       Chief Complaint:  Venous insufficiency and varicose veins   Referring Physician(s): Karishma Unrein  History of Present Illness: Kristine Jones is a 69 y.o. female with a history of venous insufficiency and left lower extremity varicosities.  Specifically, she has a chronic left distal thigh varicosity extending across the knee and anterior tibial region.  Previous ultrasound demonstrated left GSV venous insufficiency resulting in the symptomatic varicosity.  This continues to slowly progress.  She continues with a conservative management plan including compression stockings and daily walking.  She has an excellent functional status.  There is some associated discomfort over the left lower extremity varicosity.  Past Medical History:  Diagnosis Date  . Abnormal stress test    a. 09/2008, ST depression on ETT @ [facility], no further w/u. // Cath 8/14: no CAD  . Adenomatous colon polyp   . Anxiety and depression   . Aortic insufficiency    a. 06/2012 Echo: EF 55-60%, Gr 1 DD, Mild AI, Triv MR, mildly dil LA, nl RV fxn. // Echo 03/2019: EF 60-65, normal RVSF, mod AI, trace MR, trivial TR, Asc Aorta 38 mm, GLS -18.1%  . History of melanoma    Followed with Dr. Tonia Brooms  . HTN (hypertension)   . Hyperlipidemia   . Leukocytosis    Mild  . Migraines   . Osteopenia   . Paroxysmal atrial fibrillation (HCC)    admx in 09/2019 w/ choledocho lithiasis >> ERCP // several days later presented to ED with AF with RVR >> DCCV in ED unsuccessful; CHADS-VASc 4 >> Apixaban started for at least 1 month - ?? long term Rx  . Thoracic aortic aneurysm ([place])    MRA 4/19: 4.1 cm // CT 09/2019: 4 cm  . Varicose veins     Past Surgical History:  Procedure Laterality Date  . BTL    . CHOLECYSTECTOMY    . DILATION AND CURETTAGE OF UTERUS    . ERCP N/A 09/17/2019   Procedure: ENDOSCOPIC RETROGRADE  CHOLANGIOPANCREATOGRAPHY (ERCP);  Surgeon: Ronnette Juniper, MD;  Location: [facility];  Service: Gastroenterology;  Laterality: N/A;  . FEMORAL HERNIA REPAIR    . High endocervical polyp removed    . LIPOMA EXCISION Right 03/23/2015   Procedure: EXCISION LARGE LIPOMA RIGHT UPPER ARM  ;  Surgeon: Youlanda Roys, MD;  Location: [facility];  Service: Plastics;  Laterality: Right;  . MELANOMA EXCISION  2003   right calf  . OVARIAN CYST REMOVAL    . REMOVAL OF STONES  09/17/2019   Procedure: REMOVAL OF STONES;  Surgeon: Ronnette Juniper, MD;  Location: [facility] ENDOSCOPY;  Service: Gastroenterology;;  . Kristine Jones  09/17/2019   Procedure: SPHINCTEROTOMY;  Surgeon: Ronnette Juniper, MD;  Location: [facility] ENDOSCOPY;  Service: Gastroenterology;;    Allergies: Indocin [indomethacin], Ibuprofen, Lisinopril, Naproxen, Naproxen sodium, Penicillins, and Triamterene  Medications: Prior to Admission medications   Medication Sig Start Date End Date Taking? Authorizing Provider  apixaban (ELIQUIS) 5 MG TABS tablet Take 1 tablet (5 mg total) by mouth 2 (two) times daily. 01/15/20 02/14/20  Burnell Blanks, MD  Calcium Carbonate-Vitamin D (CALCIUM-VITAMIN D3) 600-200 MG-UNIT TABS Take 2 tablets by mouth daily.     [provider]  Cholecalciferol (VITAMIN D) 1000 UNITS capsule Take 1,000 Units by mouth daily.     [provider]  diltiazem (CARDIZEM CD) 120 MG  24 hr capsule Take 1 capsule (120 mg total) by mouth daily. 11/21/19   Burnell Blanks, MD  estradiol (ESTRACE) 0.1 MG/GM vaginal cream Place 1 Applicatorful vaginally as needed (vaginal discomfort).     [provider]  fish oil-omega-3 fatty acids 1000 MG capsule Take 1 g by mouth daily.      [provider]  fluticasone (FLONASE) 50 MCG/ACT nasal spray Place 1 spray into both nostrils daily as needed for allergies or rhinitis.    [provider]  multivitamin [facility]) per tablet Take 1 tablet  by mouth daily.      [provider]  risedronate (ACTONEL) 150 MG tablet Take 150 mg by mouth every 30 (thirty) days. 10/17/19   [provider]  sertraline (ZOLOFT) 50 MG tablet Take 50 mg by mouth daily.    [provider]     Family History  Problem Relation Age of Onset  . Hypertension Mother   . Migraines Mother   . Hypertension Father   . Lung cancer Father     Social History   Socioeconomic History  . Marital status: Married    Spouse name: Not on file  . Number of children: Not on file  . Years of education: Not on file  . Highest education level: Not on file  Occupational History  . Occupation: Futures trader    Comment: Freelance  Tobacco Use  . Smoking status: Never Smoker  . Smokeless tobacco: Never Used  Vaping Use  . Vaping Use: Never used  Substance and Sexual Activity  . Alcohol use: Yes    Alcohol/week: 1.0 standard drink    Types: 1 Standard drinks or equivalent per week    Comment: 1 glass of wine per week  . Drug use: No  . Sexual activity: Not on file  Other Topics Concern  . Not on file  Social History Narrative   Married   1 daughter   Gets regular exercise: Walking, yoga, weight lifting   Social Determinants of Health   Financial Resource Strain:   . Difficulty of Paying Living Expenses: Not on file  Food Insecurity:   . Worried About Charity fundraiser in the Last Year: Not on file  . Ran Out of Food in the Last Year: Not on file  Transportation Needs:   . Lack of Transportation (Medical): Not on file  . Lack of Transportation (Non-Medical): Not on file  Physical Activity:   . Days of Exercise per Week: Not on file  . Minutes of Exercise per Session: Not on file  Stress:   . Feeling of Stress : Not on file  Social Connections:   . Frequency of Communication with Friends and Family: Not on file  . Frequency of Social Gatherings with Friends and Family: Not on file  . Attends Religious Services: Not on  file  . Active Member of Clubs or Organizations: Not on file  . Attends Archivist Meetings: Not on file  . Marital Status: Not on file     Review of Systems: A 12 point ROS discussed and pertinent positives are indicated in the HPI above.  All other systems are negative.  Review of Systems  Vital Signs: There were no vitals taken for this visit.  Physical Exam Constitutional:      General: She is not in acute distress.    Appearance: She is normal weight.  Musculoskeletal:        General: No swelling.  Comments: Left lower extremity varicosity begins in the mid thigh region and courses across the knee into the anterior tibial area.  No acute signs of phlebitis or thrombophlebitis.  No signs of cellulitis.  Intact skin.  No ulcerations.  Minor scattered spider veins.  Overall stable exam.  Neurological:     Mental Status: She is alert.     Imaging: US Venous Img Lower Unilateral Left (DVT)  Result Date: 02/26/2020 CLINICAL DATA:  Left lower extremity venous insufficiency, symptomatic varicosities EXAM: LEFT LOWER EXTREMITY VENOUS DOPPLER ULTRASOUND TECHNIQUE: Gray-scale sonography with graded compression, as well as color Doppler and duplex ultrasound were performed to evaluate the lower extremity deep venous systems from the level of the common femoral vein and including the common femoral, femoral, profunda femoral, popliteal and calf veins including the posterior tibial, peroneal and gastrocnemius veins when visible. The superficial great saphenous vein was also interrogated. Spectral Doppler was utilized to evaluate flow at rest and with distal augmentation maneuvers in the common femoral, femoral and popliteal veins. COMPARISON:  None. FINDINGS: Contralateral Common Femoral Vein: Respiratory phasicity is normal and symmetric with the symptomatic side. No evidence of thrombus. Normal compressibility. Common Femoral Vein: No evidence of thrombus. Normal compressibility,  respiratory phasicity and response to augmentation. Saphenofemoral Junction: No evidence of thrombus. Normal compressibility and flow on color Doppler imaging. Remains negative for reflux Profunda Femoral Vein: No evidence of thrombus. Normal compressibility and flow on color Doppler imaging. Femoral Vein: No evidence of thrombus. Normal compressibility, respiratory phasicity and response to augmentation. Popliteal Vein: No evidence of thrombus. Normal compressibility, respiratory phasicity and response to augmentation. Calf Veins: No evidence of thrombus. Normal compressibility and flow on color Doppler imaging. Superficial Great Saphenous Vein: Left GSV redemonstrates venous insufficiency beginning in the mid thigh level where there is a branching varicosity. Mid thigh varicosity extends across the thigh, knee and anterior tibial region. This remains the patient's symptomatic varicosity. GSV anatomy again remains amenable to laser occlusion. Varicosity is also amenable to injection sclerotherapy. No aneurysmal segments are tortuosity. Venous Reflux:  As above Other Findings:  None. IMPRESSION: Negative for DVT in the left lower extremity. Left distal thigh persistent GSV venous insufficiency with associated branching thigh, knee and tibial varicosities. This remains the patient's symptomatic varicosity and reflux pathway. Electronically Signed   By: Jerilynn Mages.  [place] Gamero M.D.   On: 02/26/2020 15:10   [place] RAD EVAL AND MGMT  Result Date: 02/26/2020 Please refer to "Notes" to see consult details.   Labs:  CBC: Recent Labs    09/16/19 0902 09/16/19 0902 09/17/19 0325 09/17/19 1147 09/18/19 0129 09/19/19 1133  WBC 2.3*  --  2.6*  --  3.9* 3.7*  HGB 12.6   < > 12.2 13.9 12.6 14.0  HCT 40.4   < > 38.2 41.0 39.6 44.7  PLT 227  --  210  --  246 263   < > = values in this interval not displayed.    COAGS: Recent Labs    09/15/19 2000  INR 1.0  APTT 25    BMP: Recent Labs    09/17/19 0325  09/17/19 0325 09/17/19 1147 09/18/19 0129 09/19/19 1133 09/24/19 1216  NA 142   < > 143 138 146* 144  K 2.9*   < > 3.3* 3.3* 3.5 4.6  CL 106   < > 106 107 116* 102  CO2 25  --   --  26 22 30*  GLUCOSE 87   < > 82 202* 110* 75  BUN 9   < > 8 13 14 15   CALCIUM 8.8*  --   --  9.1 7.9* 10.0  CREATININE 0.49   < > 0.50 0.62 0.46 0.63  GFRNONAA >60  --   --  >60 >60 92  GFRAA >60  --   --  >60 >60 106   < > = values in this interval not displayed.    LIVER FUNCTION TESTS: Recent Labs    09/17/19 0325 09/18/19 0129 09/19/19 1133 09/24/19 1216  BILITOT 3.1* 1.3* 1.1 0.6  AST 233* 118* 63* 30  ALT 241* 184* 114* 61*  ALKPHOS 171* 195* 150* 146*  PROT 5.5* 5.7* 5.4* 6.9  ALBUMIN 3.2* 3.2* 3.2* 4.6    TUMOR MARKERS: No results for input(s): AFPTM, CEA, CA199, CHROMGRNA in the last 8760 hours.  Assessment and Plan:  Symptomatic left distal thigh, knee and tibial tortuous varicosity secondary to left mid thigh GSV venous insufficiency.  Left GSV venous anatomy remains amenable to transcatheter laser occlusion.  Varicosities amenable to ultrasound foam sclerotherapy.  Procedure was reviewed in detail including recovery phase and outcomes.  All questions addressed.  Plan: Scheduled for left lower extremity GSV transcatheter laser occlusion and symptomatic varicosity ultrasound foam sclerotherapy in the next few weeks.  Electronically Signed: Greggory Keen 02/26/2020, 3:23 PM   I spent a total of    15 Minutes in face to face in clinical consultation, greater than 50% of which was counseling/coordinating care for this patient with venous insufficiency and varicose veins.

## 2020-03-22 ENCOUNTER — Ambulatory Visit: Payer: Medicare Other | Admitting: Physician Assistant

## 2020-03-22 ENCOUNTER — Encounter: Payer: Self-pay | Admitting: Physician Assistant

## 2020-03-22 ENCOUNTER — Other Ambulatory Visit: Payer: Self-pay

## 2020-03-22 VITALS — BP 148/88 | HR 67 | Ht 69.0 in | Wt 156.6 lb

## 2020-03-22 DIAGNOSIS — I471 Supraventricular tachycardia, unspecified: Secondary | ICD-10-CM

## 2020-03-22 DIAGNOSIS — I712 Thoracic aortic aneurysm, without rupture, unspecified: Secondary | ICD-10-CM

## 2020-03-22 DIAGNOSIS — I10 Essential (primary) hypertension: Secondary | ICD-10-CM

## 2020-03-22 DIAGNOSIS — I480 Paroxysmal atrial fibrillation: Secondary | ICD-10-CM | POA: Diagnosis not present

## 2020-03-22 DIAGNOSIS — I351 Nonrheumatic aortic (valve) insufficiency: Secondary | ICD-10-CM | POA: Diagnosis not present

## 2020-03-22 DIAGNOSIS — Z79899 Other long term (current) drug therapy: Secondary | ICD-10-CM

## 2020-03-22 MED ORDER — DILTIAZEM HCL ER COATED BEADS 180 MG PO CP24
180.0000 mg | ORAL_CAPSULE | Freq: Every day | ORAL | 3 refills | Status: DC
Start: 2020-03-22 — End: 2021-04-06

## 2020-03-22 NOTE — Patient Instructions (Addendum)
Medication Instructions:  Your physician has recommended you make the following change in your medication:   INCREASE: Diltiazem 180mg  daily  *If you need a refill on your cardiac medications before your next appointment, please call your pharmacy*   Lab Work: TODAY: BMET, CBC  If you have labs (blood work) drawn today and your tests are completely normal, you will receive your results only by: Marland Kitchen MyChart Message (if you have MyChart) OR . A paper copy in the mail If you have any lab test that is abnormal or we need to change your treatment, we will call you to review the results.  Testing: Non-Cardiac CT Angiography (CTA), is a special type of CT scan that uses a computer to produce multi-dimensional views of major blood vessels throughout the body. In CT angiography, a contrast material is injected through an IV to help visualize the blood vessels  Follow-Up: At [facility], you and your health needs are our priority.  As part of our continuing mission to provide you with exceptional heart care, we have created designated Provider Care Teams.  These Care Teams include your primary Cardiologist (physician) and Advanced Practice Providers (APPs -  Physician Assistants and Nurse Practitioners) who all work together to provide you with the care you need, when you need it.  We recommend signing up for the patient portal called "MyChart".  Sign up information is provided on this After Visit Summary.  MyChart is used to connect with patients for Virtual Visits (Telemedicine).  Patients are able to view lab/test results, encounter notes, upcoming appointments, etc.  Non-urgent messages can be sent to your provider as well.   To learn more about what you can do with MyChart, go to [email or web address].    Your next appointment:   6 month(s)  The format for your next appointment:   In Person  Provider:   You may see Lauree Chandler, MD or one of the following Advanced Practice  Providers on your designated Care Team:    Melina Copa, PA-C  Ermalinda Barrios, PA-C   Other Instructions Check your blood pressure 1-2 times per day for 2 weeks and send [place] the readings through [facility].  How to Take Your Blood Pressure You can take your blood pressure at home with a machine. You may need to check your blood pressure at home:  To check if you have high blood pressure (hypertension).  To check your blood pressure over time.  To make sure your blood pressure medicine is working. Supplies needed: You will need a blood pressure machine, or monitor. You can buy one at a drugstore or online. When choosing one:  Choose one with an arm cuff.  Choose one that wraps around your upper arm. Only one finger should fit between your arm and the cuff.  Do not choose one that measures your blood pressure from your wrist or finger. Your doctor can suggest a monitor. How to prepare Avoid these things for 30 minutes before checking your blood pressure:  Drinking caffeine.  Drinking alcohol.  Eating.  Smoking.  Exercising. Five minutes before checking your blood pressure:  Pee.  Sit in a dining chair. Avoid sitting in a soft couch or armchair.  Be quiet. Do not talk. How to take your blood pressure Follow the instructions that came with your machine. If you have a digital blood pressure monitor, these may be the instructions: 1. Sit up straight. 2. Place your feet on the floor. Do not cross your ankles or legs.  3. Rest your left arm at the level of your heart. You may rest it on a table, desk, or chair. 4. Pull up your shirt sleeve. 5. Wrap the blood pressure cuff around the upper part of your left arm. The cuff should be 1 inch (2.5 cm) above your elbow. It is best to wrap the cuff around bare skin. 6. Fit the cuff snugly around your arm. You should be able to place only one finger between the cuff and your arm. 7. Put the cord inside the groove of your elbow. 8. Press  the power button. 9. Sit quietly while the cuff fills with air and loses air. 10. Write down the numbers on the screen. 11. Wait 2-3 minutes and then repeat steps 1-10. What do the numbers mean? Two numbers make up your blood pressure. The first number is called systolic pressure. The second is called diastolic pressure. An example of a blood pressure reading is "120 over 80" (or 120/80). If you are an adult and do not have a medical condition, use this guide to find out if your blood pressure is normal: Normal  First number: below 120.  Second number: below 80. Elevated  First number: 120-129.  Second number: below 80. Hypertension stage 1  First number: 130-139.  Second number: 80-89. Hypertension stage 2  First number: 140 or above.  Second number: 64 or above. Your blood pressure is above normal even if only the top or bottom number is above normal. Follow these instructions at home:  Check your blood pressure as often as your doctor tells you to.  Take your monitor to your next doctor's appointment. Your doctor will: ? Make sure you are using it correctly. ? Make sure it is working right.  Make sure you understand what your blood pressure numbers should be.  Tell your doctor if your medicines are causing side effects. Contact a doctor if:  Your blood pressure keeps being high. Get help right away if:  Your first blood pressure number is higher than 180.  Your second blood pressure number is higher than 120. This information is not intended to replace advice given to you by your health care provider. Make sure you discuss any questions you have with your health care provider. Document Revised: 04/13/2017 Document Reviewed: 10/08/2015 Elsevier Patient Education  2020 [employer].

## 2020-03-22 NOTE — Progress Notes (Signed)
Cardiology Office Note:    Date:  03/22/2020   ID:  Kristine, Jones 11/05/1950, MRN [id number]  PCP:  Deland Pretty, MD  [facility] Cardiologist:  Lauree Chandler, MD   [facility] Electrophysiologist:  None   Referring MD: Deland Pretty, MD   Chief Complaint:  Follow-up (AFib, HTN)    Patient Profile:    Kristine Jones is a 69 y.o. female with:   Aortic insufficiency  ? Possible bicuspid AoV ? Echocardiogram 10/19: EF 65-70, Gr 1 DD, mod AI, mild MR ? Echo 03/2019: EF 60-65, moderate AI  Paroxysmal atrial fibrillation ? Noted several days after admx w/ choledocholithiasis  ? DCCV initially failed in ED  CHA2DS2-VASc=4 (age x 1, female, HTN, vasc dz) >> Apixaban   Intol of beta-blocker (fatigue) >> Diltiazem   Hypertension   Hyperlipidemia   Thoracic aortic aneurysm   Cardiac catheterization 2014: no CAD  Prior CV studies: Monitor 11/2019 Several short episodes of SVT (18 beats - longest) PACs, PVCs - rare  Dr. Angelena Form rec starting Diltiazem if recurrent palpitations   Chest CTA 09/19/2019 Asc TAA 4 cm  Echocardiogram 04/01/2019 EF 60-65, normal RVSF, mod AI, trace MR, trivial TR, Asc Aorta 38 mm, GLS -18.1%  Echo 02/25/18 EF 65-70, no RWMA, Gr 1 DD, mod AI, mild MR, mild LAE, normal RVSF, mild TR  Chest MRA 09/03/17 IMPRESSION: VASCULAR 1. Mild fusiform aneurysmal dilatation of the tubular portion of the ascending thoracic aorta with a maximal diameter of 4.1 cm. No evidence of aortic root dilatation or effacement of the sino-tubular junction. NON-VASCULAR 1. No acute abnormality or clinically significant finding. 2. Incidental note is made of a 3.9 cm simple hepatic cyst.  Cardiac Catheterization8/22/14 Impression: 1. No angiographic evidence of CAD 2. Normal LV systolic function  Stress Echo 12/20/12 Impressions - Stress echocardiogram with no chest pain; nondiagnostic, upsloping ST depression; there appears to  be hypokinesis of the apex with peak exercise on apical 4 chamber view consistent with ischemia.  History of Present Illness:    Kristine Jones was last seen by Dr. Angelena Form in 10/2019.  She had not tolerated beta-blocker Rx and this was stopped.  An event monitor showed brief runs of SVT. The patient preferred to remain on anticoagulation.  After her monitor, she had continued palpitations and she was started on Diltiazem.  She returns for follow up.    She is here alone. She is overall doing well.  She has had a few episodes of palpitations since changing to Diltiazem. She had one episode that she thought was AFib in the summer that lasted about 45 mins. She has not had chest pain, shortness of breath, syncope, orthopnea, leg swelling.  She does feel her pulse in her head at times.  Her BP is usually normal when she checks it at those times.        Past Medical History:  Diagnosis Date  . Abnormal stress test    a. 09/2008, ST depression on ETT @ [facility], no further w/u. // Cath 8/14: no CAD  . Adenomatous colon polyp   . Anxiety and depression   . Aortic insufficiency    a. 06/2012 Echo: EF 55-60%, Gr 1 DD, Mild AI, Triv MR, mildly dil LA, nl RV fxn. // Echo 03/2019: EF 60-65, normal RVSF, mod AI, trace MR, trivial TR, Asc Aorta 38 mm, GLS -18.1%  . History of melanoma    Followed with Dr. Tonia Brooms  . HTN (hypertension)   .  Hyperlipidemia   . Leukocytosis    Mild  . Migraines   . Osteopenia   . Paroxysmal atrial fibrillation (HCC)    admx in 09/2019 w/ choledocho lithiasis >> ERCP // several days later presented to ED with AF with RVR >> DCCV in ED unsuccessful; CHADS-VASc 4 >> Apixaban started for at least 1 month - ?? long term Rx  . Thoracic aortic aneurysm ([place])    MRA 4/19: 4.1 cm // CT 09/2019: 4 cm  . Varicose veins     Current Medications: Current Meds  Medication Sig  . Calcium Carbonate-Vitamin D (CALCIUM-VITAMIN D3) 600-200 MG-UNIT TABS Take 2 tablets by mouth daily.    . Cholecalciferol (VITAMIN D) 1000 UNITS capsule Take 1,000 Units by mouth daily.   Marland Kitchen diltiazem (CARDIZEM CD) 180 MG 24 hr capsule Take 1 capsule (180 mg total) by mouth daily.  Marland Kitchen estradiol (ESTRACE) 0.1 MG/GM vaginal cream Place 1 Applicatorful vaginally as needed (vaginal discomfort).   . fish oil-omega-3 fatty acids 1000 MG capsule Take 1 g by mouth daily.    . fluticasone (FLONASE) 50 MCG/ACT nasal spray Place 1 spray into both nostrils daily as needed for allergies or rhinitis.  . multivitamin (THERAGRAN) per tablet Take 1 tablet by mouth daily.    . sertraline (ZOLOFT) 50 MG tablet Take 50 mg by mouth daily.  . [DISCONTINUED] diltiazem (CARDIZEM CD) 120 MG 24 hr capsule Take 1 capsule (120 mg total) by mouth daily.     Allergies:   Indocin [indomethacin], Ibuprofen, Lisinopril, Naproxen, Naproxen sodium, Penicillins, and Triamterene   Social History   Tobacco Use  . Smoking status: Never Smoker  . Smokeless tobacco: Never Used  Vaping Use  . Vaping Use: Never used  Substance Use Topics  . Alcohol use: Yes    Alcohol/week: 1.0 standard drink    Types: 1 Standard drinks or equivalent per week    Comment: 1 glass of wine per week  . Drug use: No     Family Hx: The patient's family history includes Hypertension in her father and mother; Lung cancer in her father; Migraines in her mother.  Review of Systems  Gastrointestinal: Negative for hematochezia and melena.  Genitourinary: Negative for hematuria.     EKGs/Labs/Other Test Reviewed:    EKG:  EKG is  ordered today.  The ekg ordered today demonstrates normal sinus rhythm, HR 67, normal axis, septal Q waves, non-specific ST-TW changes, QTc 422 ms  Recent Labs: 09/15/2019: TSH 2.539 09/19/2019: Hemoglobin 14.0; Magnesium 1.6; Platelets 263 09/24/2019: ALT 61; BUN 15; Creatinine, Ser 0.63; Potassium 4.6; Sodium 144   Recent Lipid Panel No results found for: CHOL, TRIG, HDL, CHOLHDL, LDLCALC, LDLDIRECT    Risk  Assessment/Calculations:     CHA2DS2-VASc Score = 4  This indicates a 4.8% annual risk of stroke. The patient's score is based upon: CHF History: 0 HTN History: 1 Diabetes History: 0 Stroke History: 0 Vascular Disease History: 1 Age Score: 1 Gender Score: 1     Physical Exam:    VS:  BP (!) 148/88   Pulse 67   Ht 5\' 9"  (1.753 m)   Wt 156 lb 9.6 oz (71 kg)   SpO2 97%   BMI 23.13 kg/m     Wt Readings from Last 3 Encounters:  03/22/20 156 lb 9.6 oz (71 kg)  11/12/19 158 lb 9.6 oz (71.9 kg)  09/24/19 158 lb 12.8 oz (72 kg)     Constitutional:      Appearance:  Healthy appearance. Not in distress.  Pulmonary:     Effort: Pulmonary effort is normal.     Breath sounds: No wheezing. No rales.  Cardiovascular:     Normal rate. Regular rhythm. Normal S1. Normal S2.     Murmurs: There is no murmur.  Edema:    Peripheral edema absent.  Abdominal:     Palpations: Abdomen is soft.  Musculoskeletal:     Cervical back: Neck supple. Skin:    General: Skin is warm and dry.  Neurological:     Mental Status: Alert and oriented to person, place and time.     Cranial Nerves: Cranial nerves are intact.       ASSESSMENT & PLAN:    1. PAF (paroxysmal atrial fibrillation) ([place]) 2. SVT (supraventricular tachycardia) ([place]) 3. High risk medication use CHA2DS2-VASc Score = 4 [CHF History: 0, HTN History: 1, Diabetes History: 0, Stroke History: 0, Vascular Disease History: 1, Age Score: 1, Gender Score: 1].  Therefore, the patient's annual risk of stroke is 4.8 %.   Overall, she has had just a few episodes of palpitations.  We discussed the results of her monitor.  She has occasional episodes of feeling her heartbeat in her head.  Question if this is related to PACs or brief SVT.  We discussed adjusting her diltiazem further.  She is comfortable with this.  Therefore, I will increase her diltiazem to 180 mg daily.  If she feels poorly with this, we can always switch back to 120 mg daily.   Follow-up with Dr. Angelena Form in 6 months.  4. Nonrheumatic aortic valve insufficiency Moderate AI by most recent echocardiogram.  She is due for follow-up echocardiogram this month.  5. Thoracic aortic aneurysm without rupture (HCC) 4 cm by CT in May 2021.  She needs a follow-up chest CT in May 2022.  This will be arranged.  6. Essential hypertension We reviewed several of her blood pressures from home.  Most of her blood pressures seem to be optimal.  She does use a wrist cuff.  We discussed that the optimal blood pressure is less than 130/80.  We also discussed the proper way to check blood pressure for monitoring.  I have recommended that she continue to monitor blood pressure for the next couple of weeks and send those readings in for review.    Dispo:  Return in about 6 months (around 09/19/2020) for Routine Follow Up with Dr. Angelena Form, in person.   Medication Adjustments/Labs and Tests Ordered: Current medicines are reviewed at length with the patient today.  Concerns regarding medicines are outlined above.  Tests Ordered: Orders Placed This Encounter  Procedures  . CT ANGIO CHEST AORTA W/CM & OR WO/CM  . Basic metabolic panel  . CBC  . Basic metabolic panel  . EKG 12-Lead   Medication Changes: Meds ordered this encounter  Medications  . diltiazem (CARDIZEM CD) 180 MG 24 hr capsule    Sig: Take 1 capsule (180 mg total) by mouth daily.    Dispense:  90 capsule    Refill:  3    D/C amlodipine    Signed, Richardson Dopp, PA-C  03/22/2020 3:42 PM    [facility] [address], [place], [place]  [address] Phone: [phone]; Fax: [phone]

## 2020-03-23 LAB — CBC
Hematocrit: 44.1 % (ref 34.0–46.6)
Hemoglobin: 14.8 g/dL (ref 11.1–15.9)
MCH: 29.6 pg (ref 26.6–33.0)
MCHC: 33.6 g/dL (ref 31.5–35.7)
MCV: 88 fL (ref 79–97)
Platelets: 321 10*3/uL (ref 150–450)
RBC: 5 x10E6/uL (ref 3.77–5.28)
RDW: 13 % (ref 11.7–15.4)
WBC: 4.8 10*3/uL (ref 3.4–10.8)

## 2020-03-23 LAB — BASIC METABOLIC PANEL
BUN/Creatinine Ratio: 22 (ref 12–28)
BUN: 15 mg/dL (ref 8–27)
CO2: 31 mmol/L — ABNORMAL HIGH (ref 20–29)
Calcium: 11.1 mg/dL — ABNORMAL HIGH (ref 8.7–10.3)
Chloride: 99 mmol/L (ref 96–106)
Creatinine, Ser: 0.69 mg/dL (ref 0.57–1.00)
GFR calc Af Amer: 103 mL/min/{1.73_m2} (ref >59)
GFR calc non Af Amer: 89 mL/min/{1.73_m2} (ref >59)
Glucose: 97 mg/dL (ref 65–99)
Potassium: 4.3 mmol/L (ref 3.5–5.2)
Sodium: 141 mmol/L (ref 134–144)

## 2020-03-29 ENCOUNTER — Telehealth: Payer: Self-pay | Admitting: *Deleted

## 2020-03-29 NOTE — Telephone Encounter (Signed)
   [facility] Pre-operative Risk Assessment    HEARTCARE STAFF: - Please ensure there is not already an duplicate clearance open for this procedure. - Under Visit Info/Reason for Call, type in Other and utilize the format Clearance MM/DD/YY or Clearance TBD. Do not use dashes or single digits. - If request is for dental extraction, please clarify the # of teeth to be extracted.  Request for surgical clearance:  1. What type of surgery is being performed? COLONOSCOPY   2. When is this surgery scheduled? 04/19/20   3. What type of clearance is required (medical clearance vs. Pharmacy clearance to hold med vs. Both)? BOTH  4. Are there any medications that need to be held prior to surgery and how long? ELIQUIS    5. Practice name and name of physician performing surgery? [facility]; DR. MAGOD   6. What is the office phone number? [phone]   7.   What is the office fax number? [phone]  8.   Anesthesia type (None, local, MAC, general) ? NOT LISTED   Julaine Hua 03/29/2020, 11:25 AM  _________________________________________________________________   (provider comments below)

## 2020-03-29 NOTE — Telephone Encounter (Signed)
   Primary Cardiologist: Lauree Chandler, MD  Chart reviewed as part of pre-operative protocol coverage.   Seen by Nicki Reaper weaver 03/22/20. Few episode of palpitation. overall stable.   Given past medical history and time since last visit, based on ACC/AHA guidelines, ANDELYN SPADE would be at acceptable risk for the planned procedure without further cardiovascular testing.   Recommended continuation of  Cardizem during peri-op.   Pharmacy to review anticoagulation.   [place], [place] 03/29/2020, 11:52 AM

## 2020-03-29 NOTE — Telephone Encounter (Signed)
Patient with diagnosis of afib on Eliquis for anticoagulation.    Procedure: colonoscopy Date of procedure: 04/19/20  CHA2DS2-VASc Score = 4  This indicates a 4.8% annual risk of stroke. The patient's score is based upon: CHF History: 0 HTN History: 1 Diabetes History: 0 Stroke History: 0 Vascular Disease History: 1 Age Score: 1 Gender Score: 1  Of note, CAD noted in MD and PA notes however cannot find clinical documentation of this.  CrCl 71mL/min Platelet count 321K  Per office protocol, patient can hold Eliquis for 1-2 days prior to procedure.

## 2020-04-01 ENCOUNTER — Other Ambulatory Visit (HOSPITAL_COMMUNITY): Payer: Medicare Other

## 2020-04-14 ENCOUNTER — Other Ambulatory Visit: Payer: Self-pay | Admitting: Cardiovascular Disease

## 2020-04-14 NOTE — Telephone Encounter (Signed)
Eliquis 5mg  refill request received. Patient is 69 years old, weight-71kg, Crea-0.69 on 03/22/2020, Diagnosis-Afib, and last seen by [place] PA on 03/22/2020. Dose is appropriate based on dosing criteria. Will send in refill to requested pharmacy.

## 2020-04-15 ENCOUNTER — Other Ambulatory Visit: Payer: Self-pay

## 2020-04-15 ENCOUNTER — Ambulatory Visit (HOSPITAL_COMMUNITY): Payer: Medicare Other | Attending: Cardiovascular Disease

## 2020-04-15 DIAGNOSIS — I351 Nonrheumatic aortic (valve) insufficiency: Secondary | ICD-10-CM | POA: Diagnosis not present

## 2020-04-15 LAB — ECHOCARDIOGRAM COMPLETE
Area-P 1/2: 4.89 cm2
P 1/2 time: 581 msec
S' Lateral: 2.7 cm

## 2020-06-28 ENCOUNTER — Other Ambulatory Visit: Payer: Self-pay | Admitting: Interventional Radiology

## 2020-06-28 DIAGNOSIS — I872 Venous insufficiency (chronic) (peripheral): Secondary | ICD-10-CM

## 2020-06-29 NOTE — Progress Notes (Signed)
5 mg Valium, 2 tabs, called into [facility] for pts upcoming procedure on 07/20/20 with Dr. Reesa Chew. Pt is aware of prescription and to take the medication with her the day of her appointment.

## 2020-07-15 ENCOUNTER — Telehealth: Payer: Self-pay | Admitting: Cardiovascular Disease

## 2020-07-15 NOTE — Telephone Encounter (Signed)
Kristine Jones with [facility] states Dr. Shelia Media would like to know if the patient should have a CT calcium score to determine treatment options. Please return call to discuss at [phone] (ext: [phone]).

## 2020-07-16 ENCOUNTER — Telehealth: Payer: Self-pay | Admitting: Physician Assistant

## 2020-07-16 NOTE — Telephone Encounter (Signed)
Mrs. Alcaide is a pleasant 70 yo female with PMH of PAF, aortic insufficiency, HTN, HLD, and TAA. She is currently out of town and her SBP went up to 204 temporarily before dropping back down to 180s. She denies any stroke like symptom. Her last dose of cardia xt 120mg  was this morning, she has the 180mg  Cardizem CT, however has not started on it yet.   I instructed her to take another 120mg  cardia XT tonight once she gets home, she will start on the 180mg  daily tomorrow morning. If her SBP is persistently >140 mmHg at home in the next few days, I would urge her to arrange a sooner follow up with either Richardson Dopp or Dr. Angelena Form

## 2020-07-16 NOTE — Telephone Encounter (Signed)
Returned call to Dr. Pennie Banter office at number provided.  No answer and was unable to leave a message.

## 2020-07-19 NOTE — Telephone Encounter (Signed)
Called pt in response to message. She was to have increased diltiazem to 180 at last ov in November.  She didn't because the readings were trending down.    See telephone note from H. Meng 07/16/20.   Pt has increased diltiazem to 180 Sat, Sun and this morning.  She gave 3 readings from this morning 146/84, 166/80, 139/72. She has no energy, slight headache and pressure and feels "out of sorts".     She is going to lay down and rest and check BP again in couple hours and send in her reading at that time. Discussed checking BP 3-4 hours after taking the medicine, or if develops any dizziness, severe headaches or vision changes and that those would be reasons to be seen urgently at the ED.   Adv that her med may need increased again but since she has only been on the 180 mg for 3 doses, we should continue to monitor BP unless it jumps up high again or she develops additional/worsening symptoms.

## 2020-07-20 ENCOUNTER — Other Ambulatory Visit: Payer: Self-pay | Admitting: Interventional Radiology

## 2020-07-20 ENCOUNTER — Ambulatory Visit
Admission: RE | Admit: 2020-07-20 | Discharge: 2020-07-20 | Disposition: A | Discharge status: Home / Self Care | Payer: Medicare Other | Source: Ambulatory Visit | Attending: Interventional Radiology | Admitting: Interventional Radiology

## 2020-07-20 DIAGNOSIS — I872 Venous insufficiency (chronic) (peripheral): Secondary | ICD-10-CM

## 2020-07-20 HISTORY — PX: IR EMBO VENOUS NOT HEMORR HEMANG  INC GUIDE ROADMAPPING: IMG5447

## 2020-07-26 ENCOUNTER — Telehealth: Payer: Self-pay | Admitting: Cardiovascular Disease

## 2020-07-26 MED ORDER — DILTIAZEM HCL ER COATED BEADS 120 MG PO CP24: 120.0000 mg | ORAL_CAPSULE | Freq: Every evening | ORAL | 3 refills | Status: DC

## 2020-07-26 NOTE — Telephone Encounter (Signed)
Pt has been noticing elevated BPs and feeling "out of sorts" with this.    She had been instructed previously to increase cardizem to 180 mg which she had not down because the BPs started to trend down.  At her PCP appt on 3/2 BP was elevated, and then on 3/4 she called w high readings, was instructed to take an extra 120 cardizem (her dose at the time) and to start taking 180 mg daily.  She started this dose on 3/7 and continues to have high readings. Most recently today 185/96 at 1:45 pm today an then 152/77 at 2:30 pm.  Denies SOB, chest pain or pressure, pounding or irregular heart beats.  Has some lightheadedness and pressure on left side of her head when pressures are elevated.   Reviewed with Dr. Lovena Le (DOD).  Recommendation to add the diltiazem 120 every evening in addition to the 180 daily and follow up in office.  Scheduled w PharmD on 3/29.  Pt will add the 120 mg tablet each evening.

## 2020-07-26 NOTE — Telephone Encounter (Signed)
Pt c/o BP issue: STAT if pt c/o blurred vision, one-sided weakness or slurred speech  1. What are your last 5 BP readings? 172/81 pule 70 at 12:30 today,  177/84 pulse 65 12:25 today, 11:59 today 151/85 pulse 62   159/85 pulse 66 11:55 today  2. Are you having any other symptoms (ex. Dizziness, headache, blurred vision, passed out)? Lightheaded- and out of sort  3. What is your BP issue? Blood pressure is high

## 2020-07-27 ENCOUNTER — Ambulatory Visit
Admission: RE | Admit: 2020-07-27 | Discharge: 2020-07-27 | Disposition: A | Discharge status: Home / Self Care | Payer: Medicare Other | Source: Ambulatory Visit | Attending: Interventional Radiology | Admitting: Interventional Radiology

## 2020-07-27 ENCOUNTER — Ambulatory Visit: Payer: Medicare Other

## 2020-07-27 DIAGNOSIS — I872 Venous insufficiency (chronic) (peripheral): Secondary | ICD-10-CM

## 2020-07-27 NOTE — Telephone Encounter (Signed)
Left VM for Brandy to call back if she needs further information or wants to discuss further.

## 2020-07-27 NOTE — Progress Notes (Signed)
Chief Complaint: Patient was seen in consultation today for follow-up of transcatheter laser ablation of the left great saphenous vein.    Referring Physician(s): Shick,Michael  History of Present Illness: Kristine Jones is a 70 y.o. female with symptomatic superficial venous insufficiency in the left lower extremity.  Patient underwent transcatheter laser occlusion of the left great saphenous vein and ultrasound-guided foam sclerotherapy to varicosities on 07/20/2020.  Patient is chronically on Eliquis and did not stop the medication for the procedure.  Patient has been wearing the compression stockings regularly and has no specific complaints following the procedure.  She does not complain of drainage, redness or swelling in the left lower extremity.  She denies fevers or chills.  Past Medical History:  Diagnosis Date  . Abnormal stress test    a. 09/2008, ST depression on ETT @ [facility], no further w/u. // Cath 8/14: no CAD  . Adenomatous colon polyp   . Anxiety and depression   . Aortic insufficiency    a. 06/2012 Echo: EF 55-60%, Gr 1 DD, Mild AI, Triv MR, mildly dil LA, nl RV fxn. // Echo 03/2019: EF 60-65, normal RVSF, mod AI, trace MR, trivial TR, Asc Aorta 38 mm, GLS -18.1%  . History of melanoma    Followed with Dr. Tonia Brooms  . HTN (hypertension)   . Hyperlipidemia   . Leukocytosis    Mild  . Migraines   . Osteopenia   . Paroxysmal atrial fibrillation (HCC)    admx in 09/2019 w/ choledocho lithiasis >> ERCP // several days later presented to ED with AF with RVR >> DCCV in ED unsuccessful; CHADS-VASc 4 >> Apixaban started for at least 1 month - ?? long term Rx  . Thoracic aortic aneurysm ([place])    MRA 4/19: 4.1 cm // CT 09/2019: 4 cm  . Varicose veins     Past Surgical History:  Procedure Laterality Date  . BTL    . CHOLECYSTECTOMY    . DILATION AND CURETTAGE OF UTERUS    . ERCP N/A 09/17/2019   Procedure: ENDOSCOPIC RETROGRADE CHOLANGIOPANCREATOGRAPHY (ERCP);   Surgeon: Ronnette Juniper, MD;  Location: [facility];  Service: Gastroenterology;  Laterality: N/A;  . FEMORAL HERNIA REPAIR    . High endocervical polyp removed    . IR EMBO VENOUS NOT HEMORR HEMANG  INC GUIDE ROADMAPPING  07/20/2020  . LIPOMA EXCISION Right 03/23/2015   Procedure: EXCISION LARGE LIPOMA RIGHT UPPER ARM  ;  Surgeon: Youlanda Roys, MD;  Location: [facility];  Service: Plastics;  Laterality: Right;  . MELANOMA EXCISION  2003   right calf  . OVARIAN CYST REMOVAL    . REMOVAL OF STONES  09/17/2019   Procedure: REMOVAL OF STONES;  Surgeon: Ronnette Juniper, MD;  Location: [facility] ENDOSCOPY;  Service: Gastroenterology;;  . [name]  09/17/2019   Procedure: SPHINCTEROTOMY;  Surgeon: Ronnette Juniper, MD;  Location: [facility] ENDOSCOPY;  Service: Gastroenterology;;    Allergies: Indocin [indomethacin], Ibuprofen, Lisinopril, Naproxen, Naproxen sodium, Penicillins, and Triamterene  Medications: Prior to Admission medications   Medication Sig Start Date End Date Taking? Authorizing Provider  Calcium Carbonate-Vitamin D (CALCIUM-VITAMIN D3) 600-200 MG-UNIT TABS Take 2 tablets by mouth daily.     [provider]  Cholecalciferol (VITAMIN D) 1000 UNITS capsule Take 1,000 Units by mouth daily.     [provider]  diltiazem (CARDIZEM CD) 120 MG 24 hr capsule Take 1 capsule (120 mg total) by mouth every evening. Take in addition to  180 mg tablet every morning 07/26/20   Evans Lance, MD  diltiazem (CARDIZEM CD) 180 MG 24 hr capsule Take 1 capsule (180 mg total) by mouth daily. 03/22/20   Kathlen Mody, Scott T, PA-C  ELIQUIS 5 MG TABS tablet TAKE ONE TABLET BY MOUTH TWICE A DAY 04/14/20   Burnell Blanks, MD  estradiol (ESTRACE) 0.1 MG/GM vaginal cream Place 1 Applicatorful vaginally as needed (vaginal discomfort).     [provider]  fish oil-omega-3 fatty acids 1000 MG capsule Take 1 g by mouth daily.      [provider]  fluticasone (FLONASE) 50  MCG/ACT nasal spray Place 1 spray into both nostrils daily as needed for allergies or rhinitis.    [provider]  multivitamin [facility]) per tablet Take 1 tablet by mouth daily.      [provider]  sertraline (ZOLOFT) 50 MG tablet Take 50 mg by mouth daily.    [provider]     Family History  Problem Relation Age of Onset  . Hypertension Mother   . Migraines Mother   . Hypertension Father   . Lung cancer Father     Social History   Socioeconomic History  . Marital status: Married    Spouse name: Not on file  . Number of children: Not on file  . Years of education: Not on file  . Highest education level: Not on file  Occupational History  . Occupation: Futures trader    Comment: Freelance  Tobacco Use  . Smoking status: Never Smoker  . Smokeless tobacco: Never Used  Vaping Use  . Vaping Use: Never used  Substance and Sexual Activity  . Alcohol use: Yes    Alcohol/week: 1.0 standard drink    Types: 1 Standard drinks or equivalent per week    Comment: 1 glass of wine per week  . Drug use: No  . Sexual activity: Not on file  Other Topics Concern  . Not on file  Social History Narrative   Married   1 daughter   Gets regular exercise: Walking, yoga, weight lifting   Social Determinants of Health   Financial Resource Strain: Not on file  Food Insecurity: Not on file  Transportation Needs: Not on file  Physical Activity: Not on file  Stress: Not on file  Social Connections: Not on file     Review of Systems  Constitutional: Negative.   Hematological: Bruises/bleeds easily.    Vital Signs: There were no vitals taken for this visit.  Physical Exam Constitutional:      Appearance: She is not ill-appearing.  Musculoskeletal:     Comments: Bruising along the medial aspect of the left thigh and the medial aspect of the left calf related to the recent procedure.  There is no evidence for erythema or hematoma formation.  No  evidence for a draining wound.  No significant swelling in the left lower extremity.       Imaging: US Venous Img Lower Unilateral Left (DVT)  Result Date: 07/27/2020 CLINICAL DATA:  70 year old with symptomatic left lower extremity varicose veins. Patient is status post transcatheter laser occlusion of the left great saphenous vein and ultrasound-guided foam sclerotherapy to left lower extremity varicosities. Procedures were performed on 07/20/2020. EXAM: LEFT LOWER EXTREMITY VENOUS DOPPLER ULTRASOUND TECHNIQUE: Gray-scale sonography with graded compression, as well as color Doppler and duplex ultrasound were performed to evaluate the lower extremity deep venous systems from the level of the common femoral vein and including the  common femoral, femoral, profunda femoral, popliteal and calf veins including the posterior tibial, peroneal and gastrocnemius veins when visible. The superficial great saphenous vein was also interrogated. Spectral Doppler was utilized to evaluate flow at rest and with distal augmentation maneuvers in the common femoral, femoral and popliteal veins. COMPARISON:  None. FINDINGS: Common Femoral Vein: No evidence of thrombus. Normal compressibility, respiratory phasicity and response to augmentation. Saphenofemoral Junction: No evidence of thrombus. Normal compressibility and flow on color Doppler imaging. Profunda Femoral Vein: No evidence of thrombus. Normal compressibility and flow on color Doppler imaging. Femoral Vein: No evidence of thrombus. Normal compressibility, respiratory phasicity and response to augmentation. Popliteal Vein: No evidence of thrombus. Normal compressibility, respiratory phasicity and response to augmentation. Calf Veins: Visualized left deep calf veins are patent without thrombus. Superficial Great Saphenous Vein: Saphenofemoral junction is patent but the great saphenous vein is occluded just beyond the saphenofemoral junction. Great saphenous vein is  occluded in the thigh and proximal calf. Great saphenous vein is occluded down to the puncture site in the mid calf. Great saphenous vein distal to the puncture site is compressible and patent. Other Findings: Patent compressible small varicose veins in the anterior calf. IMPRESSION: 1. Treated segment of the left great saphenous vein is occluded. 2.  Negative for deep venous thrombosis in left lower extremity. 3. Small patent varicosities in the anterior left calf. Electronically Signed   By: Markus Daft M.D.   On: 07/27/2020 13:18   [place] Injec Sclerotherapy Mult  Result Date: 07/20/2020 CLINICAL DATA:  Symptomatic lower extremity varicose veins. Left lower extremity GSV venous insufficiency, symptomatic thigh and anterior tibial varicose veins EXAM: TRANSCATHETER LASER OCCLUSION LEFT GREATER SAPHENOUS VEIN WITH ULTRASOUND GUIDANCE ULTRASOUND FOAM SCLEROTHERAPY OF RESIDUAL THIGH TIBIAL VARICOSE VEINS TECHNIQUE: Transcatheter laser occlusion: Survey ultrasound of the leg was performed and the course of the greater saphenous vein was marked on the skin. Overlying skin prepped with CHLORAPREP, draped in usual sterile fashion. After local anesthetic using 1% lidocaine, the greater saphenous vein was accessed at the MID CALF level under ultrasound with a 21-gauge micropuncture needle. A 018 guidewire advanced easily. This was exchanged using a transitional dilator for a 035 J wire. Over this, the 6 French delivery sheath was advanced beyond the saphenofemoral junction. The laser fiber was advanced and positioned 2 CM from the saphenofemoral junction. Tumescent dilute 0.1% lidocaine 345mL used along the planned treatment length. Ultrasound was used to confirm appropriate tumescent anesthesia and to verify that all segments were at least 1 cm from the skin surface. The laser fiber was activated while being withdrawn over the treatment length of 52 cm, delivering 1,963joules over 327seconds. The catheter and sheath were  removed and hemostasis easily achieved at the site. Ultrasound foam sclerotherapy: Survey of the residual varicose vein demonstrates patency in the anterior thigh and anterior tibial regions. Under sterile conditions, 1% polidocanol foam sclerotherapy was performed, 1 injection in the thigh and 1 injection in the calf areas. There was good dispersal throughout the varicosity. Approximately 2 cc injected. Patient was placed in graduated compression stockings and ambulated for 20 minutes without difficulty. Patient tolerated the procedure well, with no immediate complication. IMPRESSION: 1. Technically successful transcatheter laser occlusion of left greater saphenous vein. 2. Technically successful ultrasound foam sclerotherapy of residual anterior thigh and tibial region varicose veins. 3. Patient will followup in clinic in one week. Patient knows to telephone should there be any interval questions or problems. Electronically Signed   By: Jerilynn Mages.  Shick  M.D.   On: 07/20/2020 11:57   [place] RAD EVAL AND MGMT  Result Date: 07/27/2020 Please refer to "Notes" to see consult details.  IR EMBO VENOUS NOT HEMORR HEMANG  INC GUIDE ROADMAPPING  Result Date: 07/20/2020 CLINICAL DATA:  Symptomatic lower extremity varicose veins. Left lower extremity GSV venous insufficiency, symptomatic thigh and anterior tibial varicose veins EXAM: TRANSCATHETER LASER OCCLUSION LEFT GREATER SAPHENOUS VEIN WITH ULTRASOUND GUIDANCE ULTRASOUND FOAM SCLEROTHERAPY OF RESIDUAL THIGH TIBIAL VARICOSE VEINS TECHNIQUE: Transcatheter laser occlusion: Survey ultrasound of the leg was performed and the course of the greater saphenous vein was marked on the skin. Overlying skin prepped with CHLORAPREP, draped in usual sterile fashion. After local anesthetic using 1% lidocaine, the greater saphenous vein was accessed at the MID CALF level under ultrasound with a 21-gauge micropuncture needle. A 018 guidewire advanced easily. This was exchanged using a  transitional dilator for a 035 J wire. Over this, the 6 French delivery sheath was advanced beyond the saphenofemoral junction. The laser fiber was advanced and positioned 2 CM from the saphenofemoral junction. Tumescent dilute 0.1% lidocaine 390mL used along the planned treatment length. Ultrasound was used to confirm appropriate tumescent anesthesia and to verify that all segments were at least 1 cm from the skin surface. The laser fiber was activated while being withdrawn over the treatment length of 52 cm, delivering 1,963joules over 327seconds. The catheter and sheath were removed and hemostasis easily achieved at the site. Ultrasound foam sclerotherapy: Survey of the residual varicose vein demonstrates patency in the anterior thigh and anterior tibial regions. Under sterile conditions, 1% polidocanol foam sclerotherapy was performed, 1 injection in the thigh and 1 injection in the calf areas. There was good dispersal throughout the varicosity. Approximately 2 cc injected. Patient was placed in graduated compression stockings and ambulated for 20 minutes without difficulty. Patient tolerated the procedure well, with no immediate complication. IMPRESSION: 1. Technically successful transcatheter laser occlusion of left greater saphenous vein. 2. Technically successful ultrasound foam sclerotherapy of residual anterior thigh and tibial region varicose veins. 3. Patient will followup in clinic in one week. Patient knows to telephone should there be any interval questions or problems. Electronically Signed   By: Jerilynn Mages.  Shick M.D.   On: 07/20/2020 11:57    Labs:  CBC: Recent Labs    09/17/19 0325 09/17/19 1147 09/18/19 0129 09/19/19 1133 03/22/20 1547  WBC 2.6*  --  3.9* 3.7* 4.8  HGB 12.2 13.9 12.6 14.0 14.8  HCT 38.2 41.0 39.6 44.7 44.1  PLT 210  --  246 263 321    COAGS: Recent Labs    09/15/19 2000  INR 1.0  APTT 25    BMP: Recent Labs    09/18/19 0129 09/19/19 1133 09/24/19 1216  03/22/20 1547  NA 138 146* 144 141  K 3.3* 3.5 4.6 4.3  CL 107 116* 102 99  CO2 26 22 30* 31*  GLUCOSE 202* 110* 75 97  BUN 13 14 15 15   CALCIUM 9.1 7.9* 10.0 11.1*  CREATININE 0.62 0.46 0.63 0.69  GFRNONAA >60 >60 92 89  GFRAA >60 >60 106 103    LIVER FUNCTION TESTS: Recent Labs    09/17/19 0325 09/18/19 0129 09/19/19 1133 09/24/19 1216  BILITOT 3.1* 1.3* 1.1 0.6  AST 233* 118* 63* 30  ALT 241* 184* 114* 61*  ALKPHOS 171* 195* 150* 146*  PROT 5.5* 5.7* 5.4* 6.9  ALBUMIN 3.2* 3.2* 3.2* 4.6    TUMOR MARKERS: No results for input(s): AFPTM, CEA, CA199,  [place] in the last 8760 hours.  Assessment and Plan:  70 year old with history of symptomatic superficial venous insufficiency in the left lower extremity.  Patient is status post laser occlusion of the left great saphenous vein and ultrasound-guided foam sclerotherapy to varicosities in the left lower extremity.  Ultrasound confirms that the treated left great saphenous vein is occluded.  Negative for a DVT.  Small residual varicosities along the anterior left thigh.  Patient has no specific complaints at this time.  Recommend continued use of the compression stockings as much as possible particularly since she is on Eliquis.  Encourage patient to stay active and continue with ambulation.  Patient will be evaluated in approximately 3 weeks by Dr. Annamaria Boots.  Explained to the patient that she may need additional ultrasound-guided foam sclerotherapy to the varicosities in the left anterior calf.  Patient can contact us if she has any questions or concerns in the interim.   Electronically Signed: Burman Riis 07/27/2020, 1:21 PM   I spent a total of    10 Minutes in face to face in clinical consultation, greater than 50% of which was counseling/coordinating care for left lower extremity superficial venous insufficiency.  Patient ID: [name], female   DOB: 1950/09/03, 70 y.o.   MRN: [id number]

## 2020-07-29 NOTE — Telephone Encounter (Signed)
Thanks

## 2020-08-09 NOTE — Progress Notes (Signed)
Patient ID: Kristine Jones                 DOB: [birth year]                      MRN: [id number]     HPI: Kristine Jones is a 69 y.o. female referred by Dr. Angelena Form to HTN clinic. PMH is significant for aortic insufficiency with possible bicuspid aortic valve, paroxysmal atrial fibrillation, HTN, HLD, thoracic aortic aneurysm (4cm on chest CTA in 09/2019). Monitor 11/2019 showed several short episodes of SVT, rare PACs and PVCs. Started on diltiazem for recurrent palpitations.  Cardiac cath in 2014 showed no evidence of CAD. Echo 03/2019 showed EF 60-65%, moderate aortic insufficiency.   Last office visit 03/22/20 with Richardson Dopp PA, BP was 148/88. Notes patient used a wrist BP cuff and most home BP seemed to be optimal. Educated on proper blood pressure technique. Intolerant to beta blocker (fatigue). Diltiazem increased to 180 mg daily due to still having a few episodes of palpitations. Patient called in BP of 204/102 on 3/4, was recommended to take an extra 120 mg of diltiazem that evening and start taking 180 mg daily the next day (she had not yet increased the dose because her readings had been trending down). This helped her BP to trend down. On 3/14 reported her HR was 122 which decreased to normal after taking her diltiazem for the day. Still reported elevated readings 150s-170s/80s. Reported feeling lightheaded and "out of sort." Diltiazem was increased from 180 mg daily to 180 mg in the morning and 120 mg in the evening and referred to pharmacy clinic.   Today, patient reports her BP has been variable at home. It all started on 3/4 when she felt unfocused and her head didn't feel very good, she checked her BP and it was [id number] systolic. BP started to trend down after increasing to 180 mg of diltiazem. The highest after this was 167/87 but was trending down mostly in 120s/70s. On 3/7 she started to feel bad again, with systolics of 073, 710, 626, 166, 179. Then the readings were doing  better until 3/14, when it was 177/84. Since increasing to diltiazem 180 mg in the morning and 120 mg in the evening, she reports BPs of 125/73, 146/78, 124/71, lowest was 117/72. It seems to run higher in the mornings. She says about 75% of the readings are at goal in the past week, 25% elevated to 140s. She checks her BP multiple times throughout the day. Uses a bicep cuff now since she heard that the wrist cuff is not as accurate. 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 She strictly monitors her sodium intake to try to keep it below 2300 mg/day, the lowest she has been able to get it is 1117 mg per day.   Current HTN meds: diltiazem 180 mg qAM + 120 mg qPM Previously tried: Lisinopril (cough), triamterene/HCTZ (false gout), beta blockers (fatigue) BP goal: <130/80   Family History: Hypertension in her father and mother; Lung cancer in her father; Migraines in her mother.  Social History: Never smoker  Diet: 1 cup of coffee in the morning and 1 in the afternoon, otherwise only caffeine intake is some dark chocolate  Watches her sodium: 1171 mg sodium, tries to stay under 2000 mg/day   Exercise: Walks 5,000-7,000 steps per  day, has personal trainer (strength)  Home BP readings: Using bicep cuff now Readings are variable, have been trending down over the last week or so  Labs:  03/22/20: Scr 0.69, Na 141, K 4.3  Wt Readings from Last 3 Encounters:  03/22/20 156 lb 9.6 oz (71 kg)  11/12/19 158 lb 9.6 oz (71.9 kg)  09/24/19 158 lb 12.8 oz (72 kg)   BP Readings from Last 3 Encounters:  07/20/20 (!) 160/74  03/22/20 (!) 148/88  11/12/19 (!) 146/86   Pulse Readings from Last 3 Encounters:  07/20/20 80  03/22/20 67  11/12/19 80    Renal function: CrCl cannot be calculated (Patient's most recent lab result is older than the maximum 21 days allowed.).  Past  Medical History:  Diagnosis Date  . Abnormal stress test    a. 09/2008, ST depression on ETT @ [facility], no further w/u. // Cath 8/14: no CAD  . Adenomatous colon polyp   . Anxiety and depression   . Aortic insufficiency    a. 06/2012 Echo: EF 55-60%, Gr 1 DD, Mild AI, Triv MR, mildly dil LA, nl RV fxn. // Echo 03/2019: EF 60-65, normal RVSF, mod AI, trace MR, trivial TR, Asc Aorta 38 mm, GLS -18.1%  . History of melanoma    Followed with Dr. Tonia Brooms  . HTN (hypertension)   . Hyperlipidemia   . Leukocytosis    Mild  . Migraines   . Osteopenia   . Paroxysmal atrial fibrillation (HCC)    admx in 09/2019 w/ choledocho lithiasis >> ERCP // several days later presented to ED with AF with RVR >> DCCV in ED unsuccessful; CHADS-VASc 4 >> Apixaban started for at least 1 month - ?? long term Rx  . Thoracic aortic aneurysm ([place])    MRA 4/19: 4.1 cm // CT 09/2019: 4 cm  . Varicose veins     Current Outpatient Medications on File Prior to Visit  Medication Sig Dispense Refill  . Calcium Carbonate-Vitamin D (CALCIUM-VITAMIN D3) 600-200 MG-UNIT TABS Take 2 tablets by mouth daily.     . Cholecalciferol (VITAMIN D) 1000 UNITS capsule Take 1,000 Units by mouth daily.     Marland Kitchen diltiazem (CARDIZEM CD) 120 MG 24 hr capsule Take 1 capsule (120 mg total) by mouth every evening. Take in addition to 180 mg tablet every morning 90 capsule 3  . diltiazem (CARDIZEM CD) 180 MG 24 hr capsule Take 1 capsule (180 mg total) by mouth daily. 90 capsule 3  . ELIQUIS 5 MG TABS tablet TAKE ONE TABLET BY MOUTH TWICE A DAY 60 tablet 5  . estradiol (ESTRACE) 0.1 MG/GM vaginal cream Place 1 Applicatorful vaginally as needed (vaginal discomfort).     . fish oil-omega-3 fatty acids 1000 MG capsule Take 1 g by mouth daily.      . fluticasone (FLONASE) 50 MCG/ACT nasal spray Place 1 spray into both nostrils daily as needed for allergies or rhinitis.    . multivitamin (THERAGRAN) per tablet Take 1 tablet by mouth daily.      .  sertraline (ZOLOFT) 50 MG tablet Take 50 mg by mouth daily.     No current facility-administered medications on file prior to visit.    Allergies  Allergen Reactions  . Indocin [Indomethacin] Other (See Comments)    Stomach upset  . Ibuprofen Other (See Comments)    Grogginess  . Lisinopril Other (See Comments)  . Naproxen Other (See Comments)    CAUSE GI [place] OR HAZZY  .  Naproxen Sodium Other (See Comments)  . Penicillins Other (See Comments)    "flu like symptoms"  . Triamterene Other (See Comments)    False gout     Assessment/Plan:  1. Hypertension - Blood pressure of 142/84 in office today is elevated above goal <130/80 mmHg. It appears that her home BP readings are trending down but she still has some readings that are elevated at home as well. Does not have cuff with her today so we cannot validate its accuracy. Given elevated readings and that diltiazem does not have significant BP lowering effect, will start irbesartan 75 mg daily. Previously had cough with lisinopril, have added reaction to allergy list to reflect this. Reviewed proper blood pressure technique. Encouraged her to try to decrease to checking it to twice per day, once in the morning and evening about 1-2 hours after taking her BP medications. If she feels off she can check it again to see what her BP is. Will call patient in 1 week to see how she is doing on irbesartan. Follow up office visit in 3 weeks to check BP, compare home cuff to office cuff, and check bmet.   Rebbeca Paul, PharmD PGY1 Pharmacy Resident 08/10/2020 2:39 PM

## 2020-08-10 ENCOUNTER — Ambulatory Visit (INDEPENDENT_AMBULATORY_CARE_PROVIDER_SITE_OTHER): Payer: Medicare Other | Admitting: Student-PharmD

## 2020-08-10 ENCOUNTER — Other Ambulatory Visit: Payer: Self-pay

## 2020-08-10 VITALS — BP 142/84 | HR 78

## 2020-08-10 DIAGNOSIS — I10 Essential (primary) hypertension: Secondary | ICD-10-CM | POA: Diagnosis not present

## 2020-08-10 MED ORDER — IRBESARTAN 75 MG PO TABS: 75.0000 mg | ORAL_TABLET | Freq: Every day | ORAL | 11 refills | Status: DC

## 2020-08-10 NOTE — Patient Instructions (Addendum)
It was nice to see you today!  Your goal blood pressure is less than 130/80 mmHg. In clinic, your blood pressure was 142/84 mmHg.  Medication Changes: Begin irbesartan 75 mg once daily  Continue diltiazem 180 mg in the morning and 120 mg in the evening  Continue checking your blood pressure at home and keep a log. Try to check it 1-2 hours after taking your morning medications.   We will call you in about a week to see how your blood pressures are doing.   We will see you back in the office on April 18th at 10:30 am. Please bring your BP cuff.   Monitor blood pressure at home daily and keep a log (on your phone or piece of paper) to bring with you to your next visit. Write down date, time, blood pressure and pulse.  Keep up the good work with diet and exercise. Aim for a diet full of vegetables, fruit and lean meats (chicken, [place], fish). Try to limit salt intake by eating fresh or frozen vegetables (instead of canned), rinse canned vegetables prior to cooking and do not add any additional salt to meals.   Please give [place] a call at [phone] with any questions or concerns.

## 2020-08-18 ENCOUNTER — Telehealth: Payer: Self-pay | Admitting: Pharmacist

## 2020-08-18 NOTE — Telephone Encounter (Signed)
Called pt per her request - left message to see how she's tolerating recently started irbesartan. Has HTN f/u visit on 4/18 already scheduled.

## 2020-08-19 ENCOUNTER — Ambulatory Visit
Admission: RE | Admit: 2020-08-19 | Discharge: 2020-08-19 | Disposition: A | Discharge status: Home / Self Care | Payer: Medicare Other | Source: Ambulatory Visit | Attending: Interventional Radiology | Admitting: Interventional Radiology

## 2020-08-19 DIAGNOSIS — I872 Venous insufficiency (chronic) (peripheral): Secondary | ICD-10-CM

## 2020-08-19 NOTE — Telephone Encounter (Signed)
Patient returned call.  BP today: 115/73.  Reports occasional lightheadedness, more today than other days.  Has had no cough on irbesartan.  Advised to continue medication and keep recheck appointment on 4/18.  Patient voiced understanding.

## 2020-08-19 NOTE — Progress Notes (Signed)
Patient ID: Kristine Jones, female   DOB: 1950-08-31, 70 y.o.   MRN: [id number]       Chief Complaint:  1 month status post left GSV EVLT  Referring Physician(s): Kristine Jones  History of Present Illness: Kristine Jones is a 70 y.o. female who is now 1 month status post left GSV EVLT and varicose vein sclerotherapy, performed 07/20/2020.  Overall she is recovering very well as an outpatient.  No physical limitations.  She has been compliant with prescription strength compression stockings and daily walking.  No signs of phlebitis or cellulitis.  No interval illness, or fever.  Ultrasound today confirms occlusion of the left GSV treated segment.  Residual anterior tibial subsurface varicosities remain patent.  These are amenable to injection.  Additional ultrasound foam sclerotherapy scheduled for today.  Past Medical History:  Diagnosis Date  . Abnormal stress test    a. 09/2008, ST depression on ETT @ [facility], no further w/u. // Cath 8/14: no CAD  . Adenomatous colon polyp   . Anxiety and depression   . Aortic insufficiency    a. 06/2012 Echo: EF 55-60%, Gr 1 DD, Mild AI, Triv MR, mildly dil LA, nl RV fxn. // Echo 03/2019: EF 60-65, normal RVSF, mod AI, trace MR, trivial TR, Asc Aorta 38 mm, GLS -18.1%  . History of melanoma    Followed with Dr. Tonia Jones  . HTN (hypertension)   . Hyperlipidemia   . Leukocytosis    Mild  . Migraines   . Osteopenia   . Paroxysmal atrial fibrillation (HCC)    admx in 09/2019 w/ choledocho lithiasis >> ERCP // several days later presented to ED with AF with RVR >> DCCV in ED unsuccessful; CHADS-VASc 4 >> Apixaban started for at least 1 month - ?? long term Rx  . Thoracic aortic aneurysm ([place])    MRA 4/19: 4.1 cm // CT 09/2019: 4 cm  . Varicose veins     Past Surgical History:  Procedure Laterality Date  . BTL    . CHOLECYSTECTOMY    . DILATION AND CURETTAGE OF UTERUS    . ERCP N/A 09/17/2019   Procedure: ENDOSCOPIC RETROGRADE  CHOLANGIOPANCREATOGRAPHY (ERCP);  Surgeon: Kristine Juniper, MD;  Location: [facility];  Service: Gastroenterology;  Laterality: N/A;  . FEMORAL HERNIA REPAIR    . High endocervical polyp removed    . IR EMBO VENOUS NOT HEMORR HEMANG  INC GUIDE ROADMAPPING  07/20/2020  . LIPOMA EXCISION Right 03/23/2015   Procedure: EXCISION LARGE LIPOMA RIGHT UPPER ARM  ;  Surgeon: Kristine Roys, MD;  Location: [facility];  Service: Plastics;  Laterality: Right;  . MELANOMA EXCISION  2003   right calf  . OVARIAN CYST REMOVAL    . REMOVAL OF STONES  09/17/2019   Procedure: REMOVAL OF STONES;  Surgeon: Kristine Juniper, MD;  Location: [facility] ENDOSCOPY;  Service: Gastroenterology;;  . [name]  09/17/2019   Procedure: SPHINCTEROTOMY;  Surgeon: Kristine Juniper, MD;  Location: [facility] END[facility];  Service: Gastroenterology;;    Allergies: Indocin [indomethacin], Ibuprofen, Lisinopril, Naproxen, Naproxen sodium, Penicillins, and Triamterene  Medications: Prior to Admission medications   Medication Sig Start Date End Date Taking? Authorizing Provider  Calcium Carbonate-Vitamin D (CALCIUM-VITAMIN D3) 600-200 MG-UNIT TABS Take 2 tablets by mouth daily.     [provider]  Cholecalciferol (VITAMIN D) 1000 UNITS capsule Take 1,000 Units by mouth daily.     [provider]  diltiazem (CARDIZEM CD) 120 MG 24 hr capsule  Take 1 capsule (120 mg total) by mouth every evening. Take in addition to 180 mg tablet every morning 07/26/20   Evans Lance, MD  diltiazem (CARDIZEM CD) 180 MG 24 hr capsule Take 1 capsule (180 mg total) by mouth daily. 03/22/20   Kathlen Mody, Scott T, PA-C  ELIQUIS 5 MG TABS tablet TAKE ONE TABLET BY MOUTH TWICE A DAY 04/14/20   Burnell Blanks, MD  estradiol (ESTRACE) 0.1 MG/GM vaginal cream Place 1 Applicatorful vaginally as needed (vaginal discomfort).     [provider]  fish oil-omega-3 fatty acids 1000 MG capsule Take 1 g by mouth daily.      [provider]  fluticasone (FLONASE) 50 MCG/ACT nasal spray Place 1 spray into both nostrils daily as needed for allergies or rhinitis.    [provider]  irbesartan (AVAPRO) 75 MG tablet Take 1 tablet (75 mg total) by mouth daily. 08/10/20   Burnell Blanks, MD  multivitamin [facility]) per tablet Take 1 tablet by mouth daily.      [provider]  sertraline (ZOLOFT) 50 MG tablet Take 50 mg by mouth daily.    [provider]     Family History  Problem Relation Age of Onset  . Hypertension Mother   . Migraines Mother   . Hypertension Father   . Lung cancer Father     Social History   Socioeconomic History  . Marital status: Married    Spouse name: Not on file  . Number of children: Not on file  . Years of education: Not on file  . Highest education level: Not on file  Occupational History  . Occupation: Futures trader    Comment: Freelance  Tobacco Use  . Smoking status: Never Smoker  . Smokeless tobacco: Never Used  Vaping Use  . Vaping Use: Never used  Substance and Sexual Activity  . Alcohol use: Yes    Alcohol/week: 1.0 standard drink    Types: 1 Standard drinks or equivalent per week    Comment: 1 glass of wine per week  . Drug use: No  . Sexual activity: Not on file  Other Topics Concern  . Not on file  Social History Narrative   Married   1 daughter   Gets regular exercise: Walking, yoga, weight lifting   Social Determinants of Health   Financial Resource Strain: Not on file  Food Insecurity: Not on file  Transportation Needs: Not on file  Physical Activity: Not on file  Stress: Not on file  Social Connections: Not on file      Review of Systems: A 12 point ROS discussed and pertinent positives are indicated in the HPI above.  All other systems are negative.  Review of Systems  Vital Signs: There were no vitals taken for this visit.  Physical Exam Constitutional:      General: She is not in acute  distress.    Appearance: She is normal weight. She is not toxic-appearing.  Eyes:     General: No scleral icterus.    Conjunctiva/sclera: Conjunctivae normal.  Musculoskeletal:        General: No swelling, tenderness or deformity.     Comments: Residual tortuous elongated subsurface varicosity along the midline anterior tibial region.  Skin:    General: Skin is warm and dry.     Coloration: Skin is not jaundiced.     Findings: No bruising.      Imaging: US Venous Img Lower Unilateral Left (DVT)  Result Date: 08/19/2020 CLINICAL DATA:  Left lower extremity venous insufficiency, varicose veins, 1 month status post left GSV transcatheter laser occlusion and varicose vein sclerotherapy performed 07/20/2020. EXAM: LEFT LOWER EXTREMITY VENOUS DOPPLER ULTRASOUND TECHNIQUE: Gray-scale sonography with graded compression, as well as color Doppler and duplex ultrasound were performed to evaluate the lower extremity deep venous systems from the level of the common femoral vein and including the common femoral, femoral, profunda femoral, popliteal and calf veins including the posterior tibial, peroneal and gastrocnemius veins when visible. The superficial great saphenous vein was also interrogated. Spectral Doppler was utilized to evaluate flow at rest and with distal augmentation maneuvers in the common femoral, femoral and popliteal veins. COMPARISON:  None. FINDINGS: Contralateral Common Femoral Vein: Respiratory phasicity is normal and symmetric with the symptomatic side. No evidence of thrombus. Normal compressibility. Common Femoral Vein: No evidence of thrombus. Normal compressibility, respiratory phasicity and response to augmentation. Saphenofemoral Junction: No evidence of thrombus. Normal compressibility and flow on color Doppler imaging. Profunda Femoral Vein: No evidence of thrombus. Normal compressibility and flow on color Doppler imaging. Femoral Vein: No evidence of thrombus. Normal  compressibility, respiratory phasicity and response to augmentation. Popliteal Vein: No evidence of thrombus. Normal compressibility, respiratory phasicity and response to augmentation. Calf Veins: No evidence of thrombus. Normal compressibility and flow on color Doppler imaging. Superficial Great Saphenous Vein: Left GSV treated segment is occluded. No early recanalization. Small sub surface anterior calf and tibial varicosities remain patent. These would be amenable to additional ultrasound foam sclerotherapy. Venous Reflux:  None. Other Findings:  None. IMPRESSION: Negative for DVT. Left GSV treated segment remains occluded. Small anterior tibial residual varicosities. Electronically Signed   By: Jerilynn Mages.  Balraj Brayfield M.D.   On: 08/19/2020 11:11   US Venous Img Lower Unilateral Left (DVT)  Result Date: 07/27/2020 CLINICAL DATA:  70 year old with symptomatic left lower extremity varicose veins. Patient is status post transcatheter laser occlusion of the left great saphenous vein and ultrasound-guided foam sclerotherapy to left lower extremity varicosities. Procedures were performed on 07/20/2020. EXAM: LEFT LOWER EXTREMITY VENOUS DOPPLER ULTRASOUND TECHNIQUE: Gray-scale sonography with graded compression, as well as color Doppler and duplex ultrasound were performed to evaluate the lower extremity deep venous systems from the level of the common femoral vein and including the common femoral, femoral, profunda femoral, popliteal and calf veins including the posterior tibial, peroneal and gastrocnemius veins when visible. The superficial great saphenous vein was also interrogated. Spectral Doppler was utilized to evaluate flow at rest and with distal augmentation maneuvers in the common femoral, femoral and popliteal veins. COMPARISON:  None. FINDINGS: Common Femoral Vein: No evidence of thrombus. Normal compressibility, respiratory phasicity and response to augmentation. Saphenofemoral Junction: No evidence of thrombus.  Normal compressibility and flow on color Doppler imaging. Profunda Femoral Vein: No evidence of thrombus. Normal compressibility and flow on color Doppler imaging. Femoral Vein: No evidence of thrombus. Normal compressibility, respiratory phasicity and response to augmentation. Popliteal Vein: No evidence of thrombus. Normal compressibility, respiratory phasicity and response to augmentation. Calf Veins: Visualized left deep calf veins are patent without thrombus. Superficial Great Saphenous Vein: Saphenofemoral junction is patent but the great saphenous vein is occluded just beyond the saphenofemoral junction. Great saphenous vein is occluded in the thigh and proximal calf. Great saphenous vein is occluded down to the puncture site in the mid calf. Great saphenous vein distal to the puncture site is compressible and patent. Other Findings: Patent compressible small varicose veins in the anterior calf. IMPRESSION: 1. Treated  segment of the left great saphenous vein is occluded. 2.  Negative for deep venous thrombosis in left lower extremity. 3. Small patent varicosities in the anterior left calf. Electronically Signed   By: Markus Daft M.D.   On: 07/27/2020 13:18   [place] Injec Sclerotherapy Mult  Result Date: 08/19/2020 INDICATION: Left GSV venous insufficiency, 1 month status post EVLT. Residual anterior tibial sub surface varicosities. EXAM: ULTRASOUND INJECTION OF SCLEROSANT; MULTIPLE INCOMPETENT VEINS (SAME LEG) MEDICATIONS: 1% polidocanol ANESTHESIA/SEDATION: None. COMPLICATIONS: None immediate. PROCEDURE: Informed written consent was obtained from the patient after a thorough discussion of the procedural risks, benefits and alternatives. All questions were addressed. A timeout was performed prior to the initiation of the procedure. Survey ultrasound of the left lower extremity was performed and appropriate skin entry sites were marked. The sites were then prepped with ChloraPrep and draped in standard fashion.  Under direct ultrasound guidance a foam of 1% polidocanol was injected in small aliquots into the residual patent symptomatic anterior tibial varicose veins until there was good filling of the affected regions. FINDINGS: Persistent incompetent symptomatic varicose veins located left anterior tibial region in the left lower extremity. Injection of 2.0 mL sclerosant. IMPRESSION: Technically successful foam sclerotherapy of residual symptomatic left lower extremity varicose veins. Electronically Signed   By: Jerilynn Mages.  Storm Sovine M.D.   On: 08/19/2020 11:13   [place] RAD EVAL AND MGMT  Result Date: 08/19/2020 Please refer to "Notes" to see consult details.  [place] RAD EVAL AND MGMT  Result Date: 07/27/2020 Please refer to "Notes" to see consult details.   Labs:  CBC: Recent Labs    09/17/19 0325 09/17/19 1147 09/18/19 0129 09/19/19 1133 03/22/20 1547  WBC 2.6*  --  3.9* 3.7* 4.8  HGB 12.2 13.9 12.6 14.0 14.8  HCT 38.2 41.0 39.6 44.7 44.1  PLT 210  --  246 263 321    COAGS: Recent Labs    09/15/19 2000  INR 1.0  APTT 25    BMP: Recent Labs    09/18/19 0129 09/19/19 1133 09/24/19 1216 03/22/20 1547  NA 138 146* 144 141  K 3.3* 3.5 4.6 4.3  CL 107 116* 102 99  CO2 26 22 30* 31*  GLUCOSE 202* 110* 75 97  BUN 13 14 15 15   CALCIUM 9.1 7.9* 10.0 11.1*  CREATININE 0.62 0.46 0.63 0.69  GFRNONAA >60 >60 92 89  GFRAA >60 >60 106 103    LIVER FUNCTION TESTS: Recent Labs    09/17/19 0325 09/18/19 0129 09/19/19 1133 09/24/19 1216  BILITOT 3.1* 1.3* 1.1 0.6  AST 233* 118* 63* 30  ALT 241* 184* 114* 61*  ALKPHOS 171* 195* 150* 146*  PROT 5.5* 5.7* 5.4* 6.9  ALBUMIN 3.2* 3.2* 3.2* 4.6     Assessment and Plan:  1 month status post left GSV EVLT.  Treated segment remains occluded.  Negative for DVT.  No delayed complicating feature recanalization.  Residual anterior tibial lower extremity subsurface patent varicosities.  Plan: Additional ultrasound foam sclerotherapy of the residual  anterior tibial varicosities today.  Continue daily compression stockings as much as possible  Outpatient follow-up in 3 months for reassessment.   Electronically Signed: Greggory Keen 08/19/2020, 11:32 AM   I spent a total of    25 Minutes in face to face in clinical consultation, greater than 50% of which was counseling/coordinating care for this patient with venous insufficiency status post GSV EVLT

## 2020-08-30 ENCOUNTER — Other Ambulatory Visit: Payer: Medicare Other | Admitting: *Deleted

## 2020-08-30 ENCOUNTER — Other Ambulatory Visit: Payer: Self-pay

## 2020-08-30 ENCOUNTER — Ambulatory Visit (INDEPENDENT_AMBULATORY_CARE_PROVIDER_SITE_OTHER): Payer: Medicare Other | Admitting: Pharmacy Technician

## 2020-08-30 VITALS — BP 140/82 | HR 81

## 2020-08-30 DIAGNOSIS — I10 Essential (primary) hypertension: Secondary | ICD-10-CM

## 2020-08-30 LAB — BASIC METABOLIC PANEL
BUN/Creatinine Ratio: 22 (ref 12–28)
BUN: 14 mg/dL (ref 8–27)
CO2: 26 mmol/L (ref 20–29)
Calcium: 10.5 mg/dL — ABNORMAL HIGH (ref 8.7–10.3)
Chloride: 101 mmol/L (ref 96–106)
Creatinine, Ser: 0.64 mg/dL (ref 0.57–1.00)
Glucose: 88 mg/dL (ref 65–99)
Potassium: 4.2 mmol/L (ref 3.5–5.2)
Sodium: 141 mmol/L (ref 134–144)
eGFR: 95 mL/min/{1.73_m2} (ref >59)

## 2020-08-30 NOTE — Patient Instructions (Addendum)
It was great meeting you today! Your blood pressure is at goal of <130/80 after starting your new medication. We will call you when we have your lab results. Continue to take your blood pressure at home a few times a week and call us with any questions or concerns.

## 2020-08-30 NOTE — Progress Notes (Signed)
Patient ID: Kristine Jones                 DOB: Jun 16, 1950                      MRN: [id number]     HPI: ALEIGH GRUNDEN is a 70 y.o. female referred by Dr. Angelena Form to HTN clinic. PMH is significant for aortic insufficiency with possible bicuspid aortic valve, paroxysmal atrial fibrillation, HTN, HLD, thoracic aortic aneurysm (4cm on chest CTA in 09/2019). Monitor 11/2019 showed several short episodes of SVT, rare PACs and PVCs. Started on diltiazem for recurrent palpitations.  Cardiac cath in 2014 showed no evidence of CAD. Echo 03/2019 showed EF 60-65%, moderate aortic insufficiency.   Last office visit 03/22/20 with Richardson Dopp PA. Patient reported elevated blood pressures 150s-170s/80s with elevated HR with feelings of being lightheaded and "out of sort". Diltiazem was increased from 180 mg daily to 180 mg in the morning and 120 mg in the evening and patient was referred to pharmacy clinic.   At last visit with HTN clinic, patient's BP ranged from 120-140's/70s and noted about 75% of the readings were at goal with about 25% elevated to 140s. Patient was encouraged to only take blood pressure twice daily unless she feels lightheaded. Patient was started on irbesartan 75 mg daily. On follow up call a week later, patient reported BP was 115/73 mmHg with occasional lightheadedness but no cough.   Patient presents to HTN clinic for follow up. Patient endorsed some coughing over the last week but was unsure if it was related to allergies. Patient brought in her home cuff as well as her blood pressure readings at home. All were at goal of <130/80 mmHg with the lowest reading being 102/59 mmHg with HR consistently 60-70s. In clinic, patient's cuff read 146/72 mmHg and manual read 140/82 mmHg which confirms that her home cuff is accurate. Patient endorses occasional, random lightheadedness that is unrelated to movement, timing of medications, or being hungry.   Current HTN meds: diltiazem 180 mg qAM +  120 mg qPM, irbesartan 75 mg daily Previously tried: Lisinopril (cough), triamterene/HCTZ (false gout), beta blockers (fatigue)  BP goal: <130/57mmHg  Family History: Hypertension in her father and mother; Lung cancer in her father; Migraines in her mother.  Social History: Never smoker  Diet: 1 cup of coffee in the morning and 1 in the afternoon, otherwise only caffeine intake is some dark chocolate  Watches her sodium: 1171 mg sodium, tries to stay under 2000 mg/day   Exercise: Walks 5,000-7,000 steps per day, has personal trainer (strength)  Home BP readings: BP 128/74, 123/68, 124/72, 128/69, 124/70, 102/59; HR 60s-70s  Labs: BMET 08/30/20: pending; 03/22/20: Scr 0.69, Na 141, K 4.3  Wt Readings from Last 3 Encounters:  03/22/20 156 lb 9.6 oz (71 kg)  11/12/19 158 lb 9.6 oz (71.9 kg)  09/24/19 158 lb 12.8 oz (72 kg)   BP Readings from Last 3 Encounters:  08/10/20 (!) 142/84  07/20/20 (!) 160/74  03/22/20 (!) 148/88   Pulse Readings from Last 3 Encounters:  08/10/20 78  07/20/20 80  03/22/20 67    Renal function: CrCl cannot be calculated (Patient's most recent lab result is older than the maximum 21 days allowed.).  Past Medical History:  Diagnosis Date  . Abnormal stress test    a. 09/2008, ST depression on ETT @ [facility], no further w/u. // Cath 8/14: no CAD  . Adenomatous  colon polyp   . Anxiety and depression   . Aortic insufficiency    a. 06/2012 Echo: EF 55-60%, Gr 1 DD, Mild AI, Triv MR, mildly dil LA, nl RV fxn. // Echo 03/2019: EF 60-65, normal RVSF, mod AI, trace MR, trivial TR, Asc Aorta 38 mm, GLS -18.1%  . History of melanoma    Followed with Dr. Tonia Brooms  . HTN (hypertension)   . Hyperlipidemia   . Leukocytosis    Mild  . Migraines   . Osteopenia   . Paroxysmal atrial fibrillation (HCC)    admx in 09/2019 w/ choledocho lithiasis >> ERCP // several days later presented to ED with AF with RVR >> DCCV in ED unsuccessful; CHADS-VASc 4 >> Apixaban  started for at least 1 month - ?? long term Rx  . Thoracic aortic aneurysm ([place])    MRA 4/19: 4.1 cm // CT 09/2019: 4 cm  . Varicose veins     Current Outpatient Medications on File Prior to Visit  Medication Sig Dispense Refill  . Calcium Carbonate-Vitamin D (CALCIUM-VITAMIN D3) 600-200 MG-UNIT TABS Take 2 tablets by mouth daily.     . Cholecalciferol (VITAMIN D) 1000 UNITS capsule Take 1,000 Units by mouth daily.     Marland Kitchen diltiazem (CARDIZEM CD) 120 MG 24 hr capsule Take 1 capsule (120 mg total) by mouth every evening. Take in addition to 180 mg tablet every morning 90 capsule 3  . diltiazem (CARDIZEM CD) 180 MG 24 hr capsule Take 1 capsule (180 mg total) by mouth daily. 90 capsule 3  . ELIQUIS 5 MG TABS tablet TAKE ONE TABLET BY MOUTH TWICE A DAY 60 tablet 5  . estradiol (ESTRACE) 0.1 MG/GM vaginal cream Place 1 Applicatorful vaginally as needed (vaginal discomfort).     . fish oil-omega-3 fatty acids 1000 MG capsule Take 1 g by mouth daily.      . fluticasone (FLONASE) 50 MCG/ACT nasal spray Place 1 spray into both nostrils daily as needed for allergies or rhinitis.    Marland Kitchen irbesartan (AVAPRO) 75 MG tablet Take 1 tablet (75 mg total) by mouth daily. 30 tablet 11  . multivitamin (THERAGRAN) per tablet Take 1 tablet by mouth daily.      . sertraline (ZOLOFT) 50 MG tablet Take 50 mg by mouth daily.     No current facility-administered medications on file prior to visit.    Allergies  Allergen Reactions  . Indocin [Indomethacin] Other (See Comments)    Stomach upset  . Ibuprofen Other (See Comments)    Grogginess  . Lisinopril Cough  . Naproxen Other (See Comments)    CAUSE GI [place] OR HAZZY  . Naproxen Sodium Other (See Comments)  . Penicillins Other (See Comments)    "flu like symptoms"  . Triamterene Other (See Comments)    False gout     Assessment/Plan:  1. Hypertension - While patient's blood pressure was slightly above goal of <130/80 mmHg in clinic, home  readings are consistently below goal on diltiazem 180 mg qAM + 120 mg qPM and irbesartan 75 mg daily. Patient's home cuff was confirmed to be accurate and patient endorsed often having higher readings in office. Patient was encouraged to take her blood pressure a few times a week and call us with any increase or worsening of lightheadedness. We will follow up with patient over the phone when her BMET results.   Romilda Garret, PharmD PGY1 Acute Care Pharmacy Resident 08/30/2020 10:22 AM

## 2020-09-07 DIAGNOSIS — I712 Thoracic aortic aneurysm, without rupture, unspecified: Secondary | ICD-10-CM

## 2020-09-07 DIAGNOSIS — R42 Dizziness and giddiness: Secondary | ICD-10-CM

## 2020-09-07 DIAGNOSIS — I10 Essential (primary) hypertension: Secondary | ICD-10-CM

## 2020-09-07 DIAGNOSIS — I471 Supraventricular tachycardia: Secondary | ICD-10-CM

## 2020-09-09 MED ORDER — METOPROLOL TARTRATE 50 MG PO TABS: ORAL_TABLET | ORAL | 0 refills | Status: DC

## 2020-09-09 NOTE — Telephone Encounter (Signed)
Orders placed for cardiac CTA.  Instruction letter sent to patient in [place].  Recent BMET completed.  Once this is approved we can cancel her ct chest aorta.

## 2020-09-14 ENCOUNTER — Other Ambulatory Visit (HOSPITAL_COMMUNITY): Payer: Self-pay | Admitting: Internal Medicine

## 2020-09-17 ENCOUNTER — Telehealth (HOSPITAL_COMMUNITY): Payer: Self-pay | Admitting: *Deleted

## 2020-09-17 NOTE — Telephone Encounter (Signed)
Reaching out to patient to offer assistance regarding upcoming cardiac imaging study; pt verbalizes understanding of appt date/time, parking situation and where to check in, pre-test NPO status and medications ordered, and verified current allergies; name and call back number provided for further questions should they arise  Gordy Clement RN Navigator Cardiac Imaging [facility] [phone] office [phone] cell  Pt to take her daily cardizem and reports HR usually runs in the high 50's.  Pt instructed to take her one-time dose 50mg  metoprolol tartrate if HR was greater than 70bpm 2 hours prior to cardiac CT scan.

## 2020-09-20 ENCOUNTER — Other Ambulatory Visit: Payer: Self-pay

## 2020-09-20 ENCOUNTER — Inpatient Hospital Stay: Admission: RE | Admit: 2020-09-20 | Payer: Medicare Other | Source: Ambulatory Visit

## 2020-09-20 ENCOUNTER — Ambulatory Visit (HOSPITAL_COMMUNITY)
Admission: RE | Admit: 2020-09-20 | Discharge: 2020-09-20 | Disposition: A | Discharge status: Home / Self Care | Payer: Medicare Other | Source: Ambulatory Visit | Attending: Cardiovascular Disease | Admitting: Cardiovascular Disease

## 2020-09-20 DIAGNOSIS — Q238 Other congenital malformations of aortic and mitral valves: Secondary | ICD-10-CM | POA: Insufficient documentation

## 2020-09-20 DIAGNOSIS — R079 Chest pain, unspecified: Secondary | ICD-10-CM | POA: Insufficient documentation

## 2020-09-20 DIAGNOSIS — I10 Essential (primary) hypertension: Secondary | ICD-10-CM | POA: Insufficient documentation

## 2020-09-20 DIAGNOSIS — R42 Dizziness and giddiness: Secondary | ICD-10-CM | POA: Insufficient documentation

## 2020-09-20 DIAGNOSIS — I712 Thoracic aortic aneurysm, without rupture, unspecified: Secondary | ICD-10-CM

## 2020-09-20 DIAGNOSIS — I471 Supraventricular tachycardia: Secondary | ICD-10-CM

## 2020-09-20 MED ORDER — NITROGLYCERIN 0.4 MG SL SUBL
0.8000 mg | SUBLINGUAL_TABLET | Freq: Once | SUBLINGUAL | Status: AC
  Administered 2020-09-20: 0.8 mg via SUBLINGUAL

## 2020-09-20 MED ORDER — IOHEXOL 350 MG/ML SOLN
100.0000 mL | Freq: Once | INTRAVENOUS | Status: AC | PRN
  Administered 2020-09-20: 95 mL via INTRAVENOUS

## 2020-09-20 MED ORDER — NITROGLYCERIN 0.4 MG SL SUBL
SUBLINGUAL_TABLET | SUBLINGUAL | Status: AC
  Filled 2020-09-20: qty 2

## 2020-09-21 ENCOUNTER — Encounter: Payer: Self-pay | Admitting: Physician Assistant

## 2020-09-21 NOTE — Progress Notes (Signed)
Chief Complaint  Patient presents with  . Follow-up    CAD   History of Present Illness: 70 yo female with history of atrial fibrillation, aortic insufficiency with possible bicuspid aortic valve, HTN, HLD and thoracic aortic aneurysm who is here today for cardiac follow up. Patient was previously followed by Dr. Glade Lloyd. She established with me in 2011. She has known mild AI. She was seen in our office 12/06/12 by Richardson Dopp, PA-C and noted syncopal events with bradycardia. No exertional discomfort but she did describe resting left sided chest discomfort. Stress echo on 12/20/12 with upsloping ST depression with exercise and apical hypokinesis, worrisome for ischemia. I arranged a cardiac cath on 01/03/13 which showed no evidence of coronary artery disease. Echo November 2020 with LVEF=60-65%. Moderate AI. She was admitted to [facility] May 2021 with choledocholithiasis treated with ERCP, biliary sphincterotomy and balloon extraction. She was seen in the ED 09/19/19 with atrial fib with RVR. DCCV unsuccessful. She was seen by the cardiology team and started on metoprolol and Eliquis. CHADS VASC score 4 (vasc dis, age x 1, female, HTN). Cardiac monitor in June 2021 with no atrial fib but short runs of SVT noted. Echo December 2021 with LVEF=65-70%, mild LVH. Moderate aortic insufficiency. Cardiac CTA May 2022 with calcium score zero, no evidence of CAD, no dilatation of the aortic root.   She is here today for follow up. The patient denies any chest pain, dyspnea, palpitations, lower extremity edema, orthopnea, PND, dizziness, near syncope or syncope.    Primary Care Physician: Deland Pretty, MD  Past Medical History:  Diagnosis Date  . Abnormal stress test    a. 09/2008, ST depression on ETT @ [facility], no further w/u. // Cath 8/14: no CAD  . Adenomatous colon polyp   . Anxiety and depression   . Aortic insufficiency    a. 06/2012 Echo: EF 55-60%, Gr 1 DD, Mild AI, Triv MR, mildly dil LA, nl RV  fxn. // Echo 03/2019: EF 60-65, normal RVSF, mod AI, trace MR, trivial TR, Asc Aorta 38 mm, GLS -18.1%  . Enlarged thoracic aorta ([place])    MRA 4/19: 4.1 cm // CT 09/2019: 4 cm // Chest CTA 5/22: Ascending aorta 3.8 cm   . History of melanoma    Followed with Dr. Tonia Brooms  . HTN (hypertension)   . Hyperlipidemia   . Leukocytosis    Mild  . Migraines   . Osteopenia   . Paroxysmal atrial fibrillation (HCC)    admx in 09/2019 w/ choledocho lithiasis >> ERCP // several days later presented to ED with AF with RVR >> DCCV in ED unsuccessful; CHADS-VASc 4 >> Apixaban started for at least 1 month - ?? long term Rx  . Varicose veins     Past Surgical History:  Procedure Laterality Date  . BTL    . CHOLECYSTECTOMY    . DILATION AND CURETTAGE OF UTERUS    . ERCP N/A 09/17/2019   Procedure: ENDOSCOPIC RETROGRADE CHOLANGIOPANCREATOGRAPHY (ERCP);  Surgeon: Ronnette Juniper, MD;  Location: [place];  Service: Gastroenterology;  Laterality: N/A;  . FEMORAL HERNIA REPAIR    . High endocervical polyp removed    . IR EMBO VENOUS NOT HEMORR HEMANG  INC GUIDE ROADMAPPING  07/20/2020  . LIPOMA EXCISION Right 03/23/2015   Procedure: EXCISION LARGE LIPOMA RIGHT UPPER ARM  ;  Surgeon: Youlanda Roys, MD;  Location: [place];  Service: Plastics;  Laterality: Right;  . MELANOMA EXCISION  2003   right calf  . OVARIAN CYST REMOVAL    . REMOVAL OF STONES  09/17/2019   Procedure: REMOVAL OF STONES;  Surgeon: Ronnette Juniper, MD;  Location: [facility] ENDOSCOPY;  Service: Gastroenterology;;  . [name]  09/17/2019   Procedure: [name];  Surgeon: Ronnette Juniper, MD;  Location: [facility] ENDOSCOPY;  Service: Gastroenterology;;    Current Outpatient Medications  Medication Sig Dispense Refill  . Calcium Carbonate-Vitamin D (CALCIUM-VITAMIN D3) 600-200 MG-UNIT TABS Take 2 tablets by mouth daily.    . Cholecalciferol (VITAMIN D) 1000 UNITS capsule Take 1,000 Units by mouth daily.    Marland Kitchen diltiazem (CARDIZEM CD) 120  MG 24 hr capsule Take 1 capsule (120 mg total) by mouth every evening. Take in addition to 180 mg tablet every morning 90 capsule 3  . diltiazem (CARDIZEM CD) 180 MG 24 hr capsule Take 1 capsule (180 mg total) by mouth daily. 90 capsule 3  . ELIQUIS 5 MG TABS tablet TAKE ONE TABLET BY MOUTH TWICE A DAY 60 tablet 5  . estradiol (ESTRACE) 0.1 MG/GM vaginal cream Place 1 Applicatorful vaginally as needed (vaginal discomfort).     . fish oil-omega-3 fatty acids 1000 MG capsule Take 1 g by mouth daily.    . fluticasone (FLONASE) 50 MCG/ACT nasal spray Place 1 spray into both nostrils daily as needed for allergies or rhinitis.    Marland Kitchen irbesartan (AVAPRO) 75 MG tablet Take 1 tablet (75 mg total) by mouth daily. 30 tablet 11  . metoprolol tartrate (LOPRESSOR) 50 MG tablet Take one tablet 2 hours before CT scan 1 tablet 0  . multivitamin (THERAGRAN) per tablet Take 1 tablet by mouth daily.    . sertraline (ZOLOFT) 50 MG tablet Take 50 mg by mouth daily.     No current facility-administered medications for this visit.    Allergies  Allergen Reactions  . Indocin [Indomethacin] Other (See Comments)    Stomach upset  . Ibuprofen Other (See Comments)    Grogginess  . Lisinopril Cough  . Naproxen Other (See Comments)    CAUSE GI [place] OR HAZZY  . Naproxen Sodium Other (See Comments)  . Penicillins Other (See Comments)    "flu like symptoms"  . Triamterene Other (See Comments)    False gout    Social History   Socioeconomic History  . Marital status: Married    Spouse name: Not on file  . Number of children: Not on file  . Years of education: Not on file  . Highest education level: Not on file  Occupational History  . Occupation: Futures trader    Comment: Freelance  Tobacco Use  . Smoking status: Never Smoker  . Smokeless tobacco: Never Used  Vaping Use  . Vaping Use: Never used  Substance and Sexual Activity  . Alcohol use: Yes    Alcohol/week: 1.0 standard drink     Types: 1 Standard drinks or equivalent per week    Comment: 1 glass of wine per week  . Drug use: No  . Sexual activity: Not on file  Other Topics Concern  . Not on file  Social History Narrative   Married   1 daughter   Gets regular exercise: Walking, yoga, weight lifting   Social Determinants of Health   Financial Resource Strain: Not on file  Food Insecurity: Not on file  Transportation Needs: Not on file  Physical Activity: Not on file  Stress: Not on file  Social Connections: Not on file  Intimate Partner Violence: Not on  file    Family History  Problem Relation Age of Onset  . Hypertension Mother   . Migraines Mother   . Hypertension Father   . Lung cancer Father     Review of Systems:  As stated in the HPI and otherwise negative.   BP 138/72   Pulse 71   Ht 5\' 9"  (1.753 m)   Wt 151 lb 6.4 oz (68.7 kg)   SpO2 99%   BMI 22.36 kg/m   Physical Examination:  General: Well developed, well nourished, NAD  HEENT: OP clear, mucus membranes moist  SKIN: warm, dry. No rashes. Neuro: No focal deficits  Musculoskeletal: Muscle strength 5/5 all ext  Psychiatric: Mood and affect normal  Neck: No JVD, no carotid bruits, no thyromegaly, no lymphadenopathy.  Lungs:Clear bilaterally, no wheezes, rhonci, crackles Cardiovascular: Regular rate and rhythm. No murmurs, gallops or rubs. Abdomen:Soft. Bowel sounds present. Non-tender.  Extremities: No lower extremity edema. Pulses are 2 + in the bilateral DP/PT.  Echo 04/15/20: 1. Left ventricular ejection fraction, by estimation, is 65 to 70%. Left  ventricular ejection fraction by 3D volume is 68 %. The left ventricle has  normal function. The left ventricle has no regional wall motion  abnormalities. There is mild concentric  left ventricular hypertrophy. Left ventricular diastolic function could  not be evaluated. The average left ventricular global longitudinal strain  is -22.1 %. The global longitudinal strain is  normal.  2. Right ventricular systolic function is normal. The right ventricular  size is normal. There is normal pulmonary artery systolic pressure. The  estimated right ventricular systolic pressure is 84.1 mmHg.  3. The mitral valve is grossly normal. Trivial mitral valve  regurgitation. No evidence of mitral stenosis.  4. The aortic valve is tricuspid. There is mild calcification of the  aortic valve. There is mild thickening of the aortic valve. Aortic valve  regurgitation is moderate. Mild aortic valve sclerosis is present, with no  evidence of aortic valve stenosis.  5. The inferior vena cava is normal in size with greater than 50%  respiratory variability, suggesting right atrial pressure of 3 mmHg.     Cardiac cath 01/03/13: 1. No angiographic evidence of CAD  2. Normal LV systolic function  Cardiac CTA May 2022: No evidence of CAD Calcium score zero Mid ascending aorta 3.9 cm  EKG:  EKG is not ordered today. The ekg ordered today demonstrates   Recent Labs: 09/24/2019: ALT 61 03/22/2020: Hemoglobin 14.8; Platelets 321 08/30/2020: BUN 14; Creatinine, Ser 0.64; Potassium 4.2; Sodium 141   Lipid Panel No results found for: CHOL, TRIG, HDL, CHOLHDL, VLDL, LDLCALC, LDLDIRECT   Wt Readings from Last 3 Encounters:  09/22/20 151 lb 6.4 oz (68.7 kg)  03/22/20 156 lb 9.6 oz (71 kg)  11/12/19 158 lb 9.6 oz (71.9 kg)     Other studies Reviewed: Additional studies/ records that were reviewed today include: . Review of the above records demonstrates:    Assessment and Plan:   1. HTN: Managed in primary care. BP is well controlled. No changes today  2. Aortic insufficiency: Moderate by echo in December 2021. Will repeat echo in December 2022.  3. Thoracic aortic aneurysm: Stable at 3.9  cm by chest CTA May 2022. Repeat chest CTA in may 2024 given stability and very mild abnormality.   4. PAF/Paroxysmal SVT: She is in sinus today. Her atrial fibrillation may have been  due to her acute GI illness. She wishes to remain on Eliquis and I agree  with that. Continue metoprolol, Cardizem and Eliquis.   We reviewed her CT reports today.   Current medicines are reviewed at length with the patient today.  The patient does not have concerns regarding medicines.  The following changes have been made:  no change  Labs/ tests ordered today include:   Orders Placed This Encounter  Procedures  . ECHOCARDIOGRAM COMPLETE   Disposition:   FU with me in 12  months  Signed, Lauree Chandler, MD 09/22/2020 12:06 PM    [address], [place], [place]  [address] Phone: [phone]; Fax: [phone]

## 2020-09-22 ENCOUNTER — Ambulatory Visit: Payer: Medicare Other | Admitting: Cardiovascular Disease

## 2020-09-22 ENCOUNTER — Other Ambulatory Visit: Payer: Self-pay

## 2020-09-22 ENCOUNTER — Encounter: Payer: Self-pay | Admitting: Cardiovascular Disease

## 2020-09-22 VITALS — BP 138/72 | HR 71 | Ht 69.0 in | Wt 151.4 lb

## 2020-09-22 DIAGNOSIS — I10 Essential (primary) hypertension: Secondary | ICD-10-CM

## 2020-09-22 DIAGNOSIS — I471 Supraventricular tachycardia, unspecified: Secondary | ICD-10-CM

## 2020-09-22 DIAGNOSIS — I712 Thoracic aortic aneurysm, without rupture, unspecified: Secondary | ICD-10-CM

## 2020-09-22 DIAGNOSIS — I351 Nonrheumatic aortic (valve) insufficiency: Secondary | ICD-10-CM

## 2020-09-22 DIAGNOSIS — I480 Paroxysmal atrial fibrillation: Secondary | ICD-10-CM | POA: Diagnosis not present

## 2020-09-22 NOTE — Patient Instructions (Signed)
Medication Instructions:  Your physician recommends that you continue on your current medications as directed. Please refer to the Current Medication list given to you today.  *If you need a refill on your cardiac medications before your next appointment, please call your pharmacy*   Lab Work: None If you have labs (blood work) drawn today and your tests are completely normal, you will receive your results only by: Marland Kitchen MyChart Message (if you have MyChart) OR . A paper copy in the mail If you have any lab test that is abnormal or we need to change your treatment, we will call you to review the results.   Testing/Procedures: Your physician has requested that you have an echocardiogram in December 2022. Echocardiography is a painless test that uses sound waves to create images of your heart. It provides your doctor with information about the size and shape of your heart and how well your heart's chambers and valves are working. This procedure takes approximately one hour. There are no restrictions for this procedure.     Follow-Up: At [facility], you and your health needs are our priority.  As part of our continuing mission to provide you with exceptional heart care, we have created designated Provider Care Teams.  These Care Teams include your primary Cardiologist (physician) and Advanced Practice Providers (APPs -  Physician Assistants and Nurse Practitioners) who all work together to provide you with the care you need, when you need it.  We recommend signing up for the patient portal called "MyChart".  Sign up information is provided on this After Visit Summary.  MyChart is used to connect with patients for Virtual Visits (Telemedicine).  Patients are able to view lab/test results, encounter notes, upcoming appointments, etc.  Non-urgent messages can be sent to your provider as well.   To learn more about what you can do with MyChart, go to [email or web address].    Your next  appointment:   1 year(s)  The format for your next appointment:   In Person  Provider:   You may see Lauree Chandler, MD or one of the following Advanced Practice Providers on your designated Care Team:    Melina Copa, PA-C  Ermalinda Barrios, PA-C    Other Instructions

## 2020-11-24 ENCOUNTER — Other Ambulatory Visit: Payer: Self-pay | Admitting: Interventional Radiology

## 2020-11-24 DIAGNOSIS — I872 Venous insufficiency (chronic) (peripheral): Secondary | ICD-10-CM

## 2020-11-25 ENCOUNTER — Other Ambulatory Visit: Payer: Self-pay | Admitting: Cardiovascular Disease

## 2020-11-25 NOTE — Telephone Encounter (Signed)
Pt pharmacy requesting refill on this medication. Prescribed by Dr. Lovena Le, but pt only sees Dr. Angelena Form. Please advise.

## 2020-12-23 ENCOUNTER — Other Ambulatory Visit: Payer: Self-pay

## 2020-12-23 ENCOUNTER — Ambulatory Visit
Admission: RE | Admit: 2020-12-23 | Discharge: 2020-12-23 | Disposition: A | Discharge status: Home / Self Care | Payer: Medicare Other | Source: Ambulatory Visit | Attending: Interventional Radiology | Admitting: Interventional Radiology

## 2020-12-23 DIAGNOSIS — I872 Venous insufficiency (chronic) (peripheral): Secondary | ICD-10-CM

## 2020-12-23 NOTE — Progress Notes (Signed)
Patient ID: Kristine Jones, female   DOB: [birth year], 70 y.o.   MRN: [id number]       Chief Complaint:  Left GSV venous insufficiency, symptomatic varicose veins  Referring Physician(s): Nemiah Bubar  History of Present Illness: Kristine Jones is a 70 y.o. female who is now approximately 63-monthstatus post left GSV EVLT and varicose vein sclerotherapy performed 07/20/2020.  Overall she continues to do very well.  Excellent functional status.  No physical limitations.  She has been compliant with prescription strength compression stockings and routine exercise.  No residual leg pain or symptoms.  Repeat ultrasound today confirms occlusion of the left GSV treated segment with further retraction.  Majority of the tibial varicosities are thrombosed.  No significant subsurface varicose veins that warrant additional sclerotherapy today.  She does point out several bilateral scattered spider veins.  Past Medical History:  Diagnosis Date   Abnormal stress test    a. 09/2008, [facility], no further w/u. // Cath 8/14: no CAD   Adenomatous colon polyp    Anxiety and depression    Aortic insufficiency    a. 06/2012 Echo: EF 55-60%, Gr 1 DD, Mild AI, Triv MR, mildly dil LA, nl RV fxn. // Echo 03/2019: EF 60-65, normal RVSF, mod AI, trace MR, trivial TR, Asc Aorta 38 mm, GLS -18.1%   Enlarged thoracic aorta (HCC)    MRA 4/19: 4.1 cm // CT 09/2019: 4 cm // Chest CTA 5/22: Ascending aorta 3.8 cm    History of melanoma    Followed with Dr. GTonia Brooms  HTN (hypertension)    Hyperlipidemia    Leukocytosis    Mild   Migraines    Osteopenia    Paroxysmal atrial fibrillation ([facility]    admx in 09/2019 w/ choledocho lithiasis >> ERCP // several days later presented to ED with AF with RVR >> DCCV in ED unsuccessful; CHADS-VASc 4 >> Apixaban started for at least 1 month - ?? long term Rx   Varicose veins     Past Surgical History:  Procedure Laterality Date   BTL      CHOLECYSTECTOMY     DILATION AND CURETTAGE OF UTERUS     ERCP N/A 09/17/2019   Procedure: ENDOSCOPIC RETROGRADE CHOLANGIOPANCREATOGRAPHY (ERCP);  Surgeon: KRonnette Juniper MD;  [place]  Service: Gastroenterology;  Laterality: N/A;   FEMORAL HERNIA REPAIR     High endocervical polyp removed     IR EMBO VENOUS NOT HEMORR HEMANG  INC GUIDE ROADMAPPING  07/20/2020   LIPOMA EXCISION Right 03/23/2015   Procedure: EXCISION LARGE LIPOMA RIGHT UPPER ARM  ;  Surgeon: MYoulanda Roys MD;  [place]  Service: Plastics;  Laterality: Right;   MELANOMA EXCISION  2003   right calf   OVARIAN CYST REMOVAL     REMOVAL OF STONES  09/17/2019   Procedure: REMOVAL OF STONES;  Surgeon: KRonnette Juniper MD;  [facility];  Service: Gastroenterology;;   SJoan Mayans 09/17/2019   Procedure: SJoan Mayans  Surgeon: KRonnette Juniper MD;  [facility];  Service: Gastroenterology;;    Allergies: Indocin [indomethacin], Ibuprofen, Lisinopril, Naproxen, Naproxen sodium, Penicillins, and Triamterene  Medications: Prior to Admission medications   Medication Sig Start Date End Date Taking? Authorizing Provider  Calcium Carbonate-Vitamin D (CALCIUM-VITAMIN D3) 600-200 MG-UNIT TABS Take 2 tablets by mouth daily.    [provider]  Cholecalciferol (VITAMIN D) 1000 UNITS capsule Take 1,000 Units by mouth daily.  [provider]  diltiazem (CARDIZEM CD) 120 MG 24 hr capsule Take one tablet by mouth every evening.  Take in addition to diltiazem 180 mg every morning. 11/25/20   Burnell Blanks, MD  diltiazem (CARDIZEM CD) 180 MG 24 hr capsule Take 1 capsule (180 mg total) by mouth daily. 03/22/20   Kathlen Mody, Scott T, PA-C  ELIQUIS 5 MG TABS tablet TAKE ONE TABLET BY MOUTH TWICE A DAY 04/14/20   Burnell Blanks, MD  estradiol (ESTRACE) 0.1 MG/GM vaginal cream Place 1 Applicatorful vaginally as needed (vaginal discomfort).     [provider]  fish oil-omega-3 fatty acids 1000 MG capsule Take 1 g by mouth daily.    [provider]  fluticasone (FLONASE) 50 MCG/ACT nasal spray Place 1 spray into both nostrils daily as needed for allergies or rhinitis.    [provider]  irbesartan (AVAPRO) 75 MG tablet Take 1 tablet (75 mg total) by mouth daily. 08/10/20   Burnell Blanks, MD  metoprolol tartrate (LOPRESSOR) 50 MG tablet Take one tablet 2 hours before CT scan 09/09/20   Burnell Blanks, MD  multivitamin [facility]) per tablet Take 1 tablet by mouth daily.    [provider]  sertraline (ZOLOFT) 50 MG tablet Take 50 mg by mouth daily.    [provider]     Family History  Problem Relation Age of Onset   Hypertension Mother    Migraines Mother    Hypertension Father    Lung cancer Father     Social History   Socioeconomic History   Marital status: Married    Spouse name: Not on file   Number of children: Not on file   Years of education: Not on file   Highest education level: Not on file  Occupational History   Occupation: Futures trader    Comment: Freelance  Tobacco Use   Smoking status: Never   Smokeless tobacco: Never  Vaping Use   Vaping Use: Never used  Substance and Sexual Activity   Alcohol use: Yes    Alcohol/week: 1.0 standard drink    Types: 1 Standard drinks or equivalent per week    Comment: 1 glass of wine per week   Drug use: No   Sexual activity: Not on file  Other Topics Concern   Not on file  Social History Narrative   Married   1 daughter   Gets regular exercise: Walking, yoga, weight lifting   Social Determinants of Health   Financial Resource Strain: Not on file  Food Insecurity: Not on file  Transportation Needs: Not on file  Physical Activity: Not on file  Stress: Not on file  Social Connections: Not on file     Review of Systems: A 12 point ROS discussed and pertinent positives are indicated in the HPI  above.  All other systems are negative.  Review of Systems  Vital Signs: There were no vitals taken for this visit.  Physical Exam Constitutional:      General: She is not in acute distress.    Appearance: Normal appearance. She is normal weight. She is not toxic-appearing.  Musculoskeletal:        General: No swelling, tenderness or deformity. Normal range of motion.     Right lower leg: No edema.     Left lower leg: No edema.     Comments: Several scattered bilateral lower extremity spider veins.  Thrombosed varicosities in the left anterior tibial region are also smaller.  No skin lesions.    Imaging: No results found.  Labs:  CBC: Recent Labs    03/22/20 1547  WBC 4.8  HGB 14.8  HCT 44.1  PLT 321    COAGS: No results for input(s): INR, APTT in the last 8760 hours.  BMP: Recent Labs    03/22/20 1547 08/30/20 1035  NA 141 141  K 4.3 4.2  CL 99 101  CO2 31* 26  GLUCOSE 97 88  BUN 15 14  CALCIUM 11.1* 10.5*  CREATININE 0.69 0.64  GFRNONAA 89  --   GFRAA 103  --     LIVER FUNCTION TESTS: No results for input(s): BILITOT, AST, ALT, ALKPHOS, PROT, ALBUMIN in the last 8760 hours.  Assessment and Plan:  6 months status post left GSV EVLT.  Residual varicosities were also injected with foam sclerotherapy.  Overall she has recovered very well.  No delayed complicating feature recanalization.  Negative for DVT.  Residual anterior tibial subsurface varicosities are also thrombosed following additional sclerotherapy.  Plan: Continue compression stockings as needed.  Outpatient follow-up in 6 months.   Electronically Signed: Greggory Keen 12/23/2020, 11:36 AM   I spent a total of    25 Minutes in face to face in clinical consultation, greater than 50% of which was counseling/coordinating care for this patient with venous insufficiency and varicose veins.

## 2021-03-13 ENCOUNTER — Other Ambulatory Visit: Payer: Self-pay | Admitting: Cardiovascular Disease

## 2021-03-14 NOTE — Telephone Encounter (Signed)
Prescription refill request for Eliquis received. Indication: afib  Last office visit: [place], 09/22/2020 Scr: 0.64, 08/30/2020 Age: 70 yo  Weight: 68.7   Refill sent.

## 2021-04-06 ENCOUNTER — Other Ambulatory Visit: Payer: Self-pay | Admitting: Physician Assistant

## 2021-04-14 ENCOUNTER — Other Ambulatory Visit: Payer: Self-pay

## 2021-04-14 ENCOUNTER — Ambulatory Visit (HOSPITAL_COMMUNITY): Payer: Medicare Other | Attending: Cardiovascular Disease

## 2021-04-14 DIAGNOSIS — I351 Nonrheumatic aortic (valve) insufficiency: Secondary | ICD-10-CM | POA: Insufficient documentation

## 2021-04-14 LAB — ECHOCARDIOGRAM COMPLETE
AR max vel: 2.32 cm2
AV Area VTI: 2.35 cm2
AV Area mean vel: 2.25 cm2
AV Mean grad: 6.5 mmHg
AV Peak grad: 11.9 mmHg
Ao pk vel: 1.73 m/s
Area-P 1/2: 4.19 cm2
P 1/2 time: 468 msec
S' Lateral: 2.6 cm

## 2021-05-02 IMAGING — US US EXTREM LOW VENOUS*L*
1 series · 13 of 24 positions shown · non-contrast
Comparison: None.

CLINICAL DATA: Left lower extremity venous insufficiency, varicose
veins, 1 month status post left GSV transcatheter laser occlusion
and varicose vein sclerotherapy performed 07/20/2020.



[Series 1: us extrem low venous*left* · 0.07mm/px · 42 acquisitions, 13 frames shown]
[im 1/42]
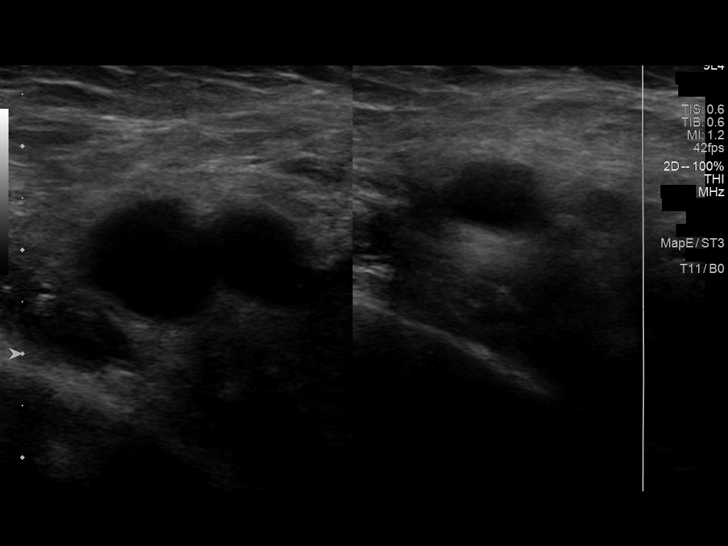
[im 4/42]
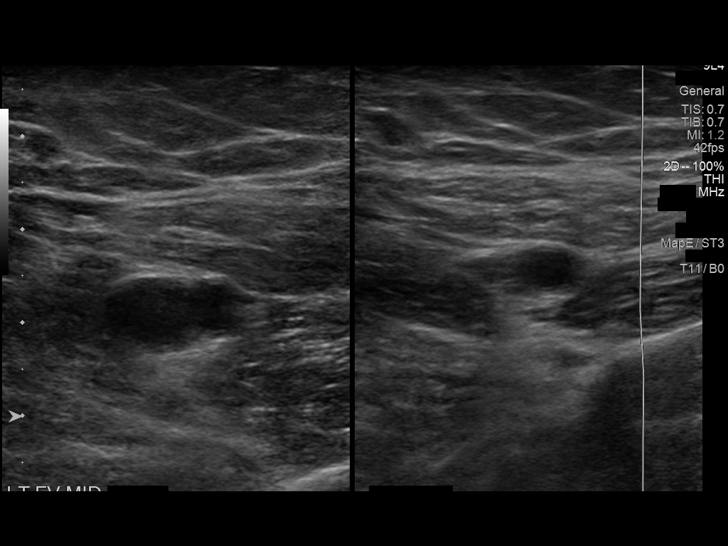
[im 8/42]
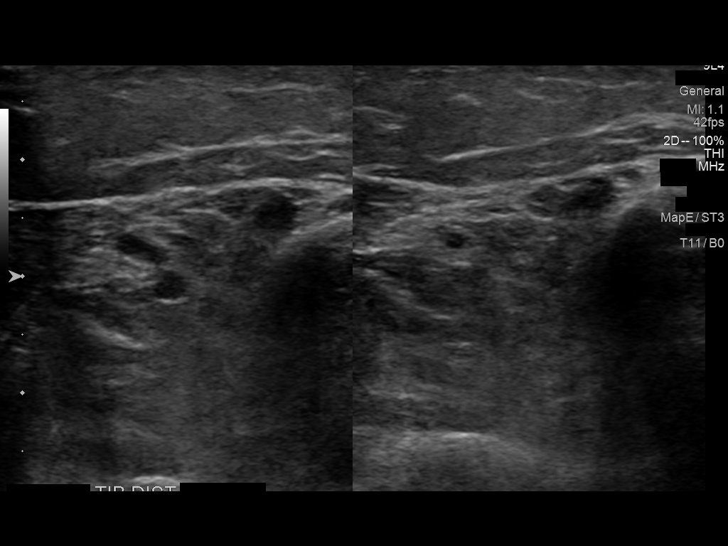
[im 13/42]
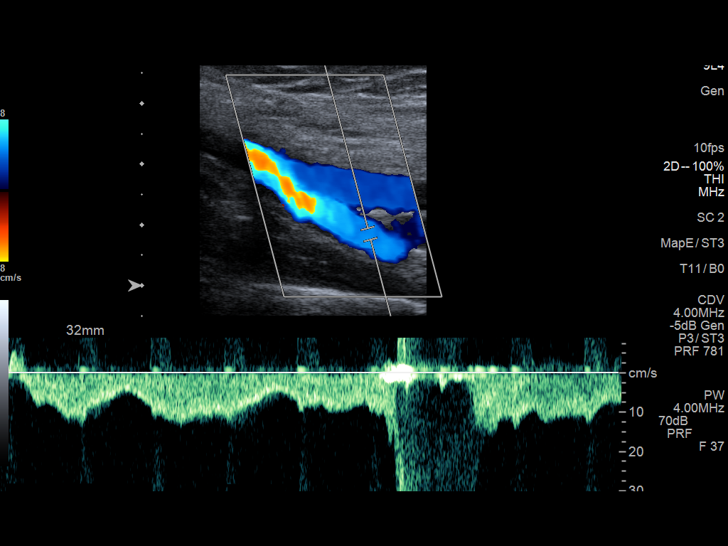
[im 17/42]
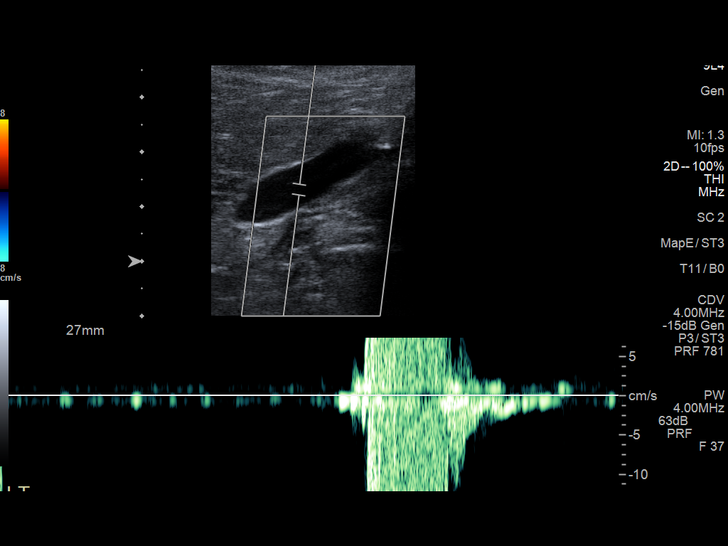
[im 20/42]
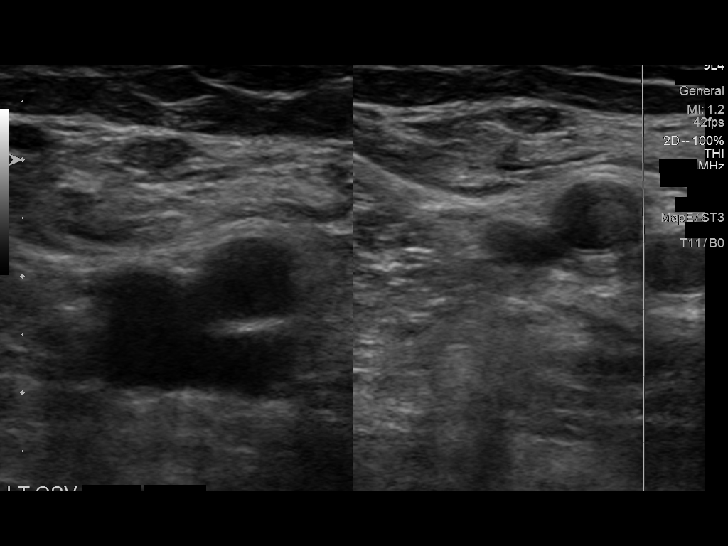
[im 24/42]
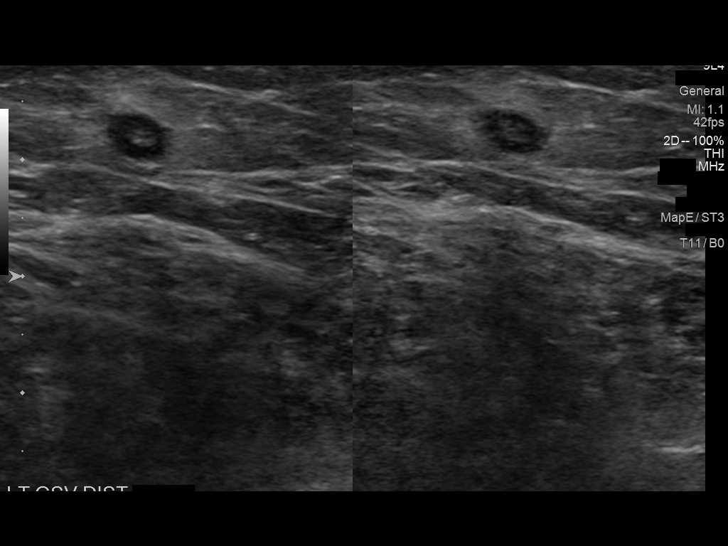
[im 25/42]
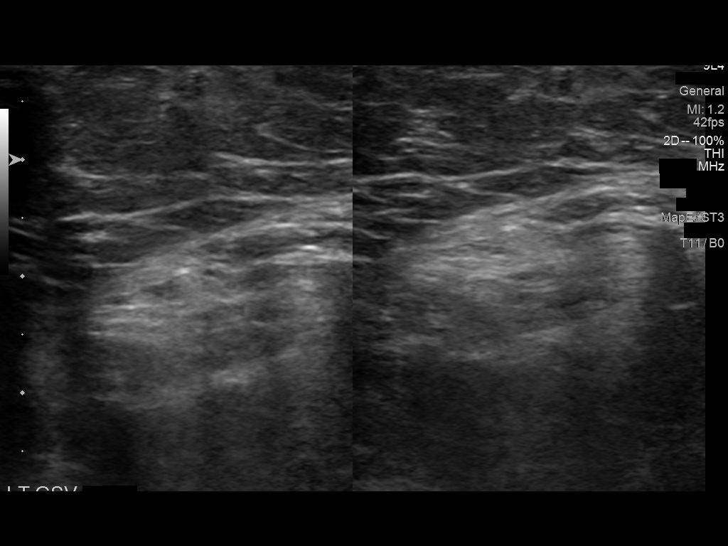
[im 29/42]
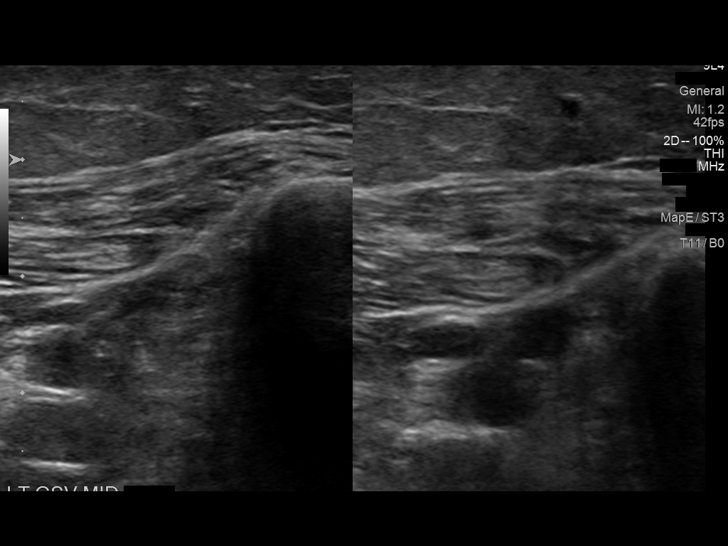
[im 33/42]
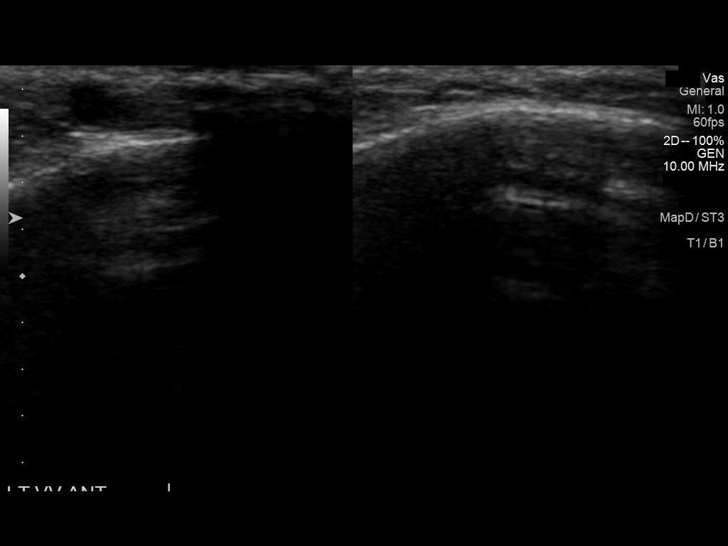
[im 36/42]
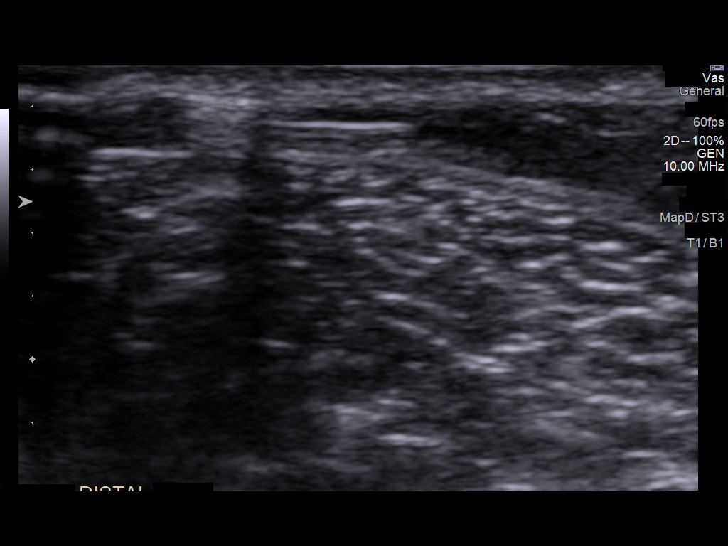
[im 40/42]
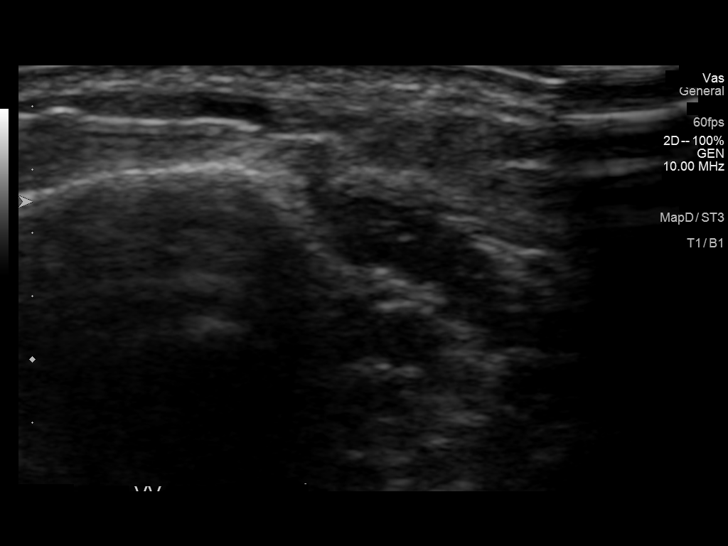
[im 42/42]
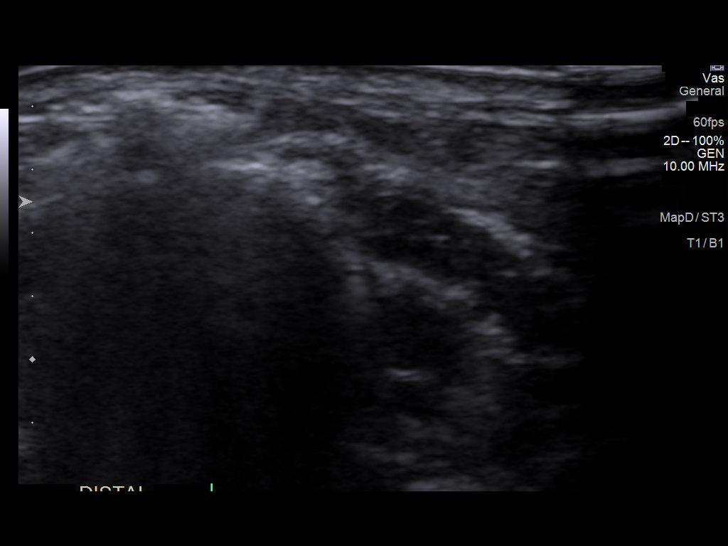

[13 of 24 positions shown; findings below may reference images not displayed]

FINDINGS: Contralateral Common Femoral Vein: Respiratory phasicity is normal
and symmetric with the symptomatic side. No evidence of thrombus.
Normal compressibility.

Common Femoral Vein: No evidence of thrombus. Normal
compressibility, respiratory phasicity and response to augmentation.

Saphenofemoral Junction: No evidence of thrombus. Normal
compressibility and flow on color Doppler imaging.

Profunda Femoral Vein: No evidence of thrombus. Normal
compressibility and flow on color Doppler imaging.

Femoral Vein: No evidence of thrombus. Normal compressibility,
respiratory phasicity and response to augmentation.

Popliteal Vein: No evidence of thrombus. Normal compressibility,
respiratory phasicity and response to augmentation.

Calf Veins: No evidence of thrombus. Normal compressibility and flow
on color Doppler imaging.

Superficial Great Saphenous Vein: Left GSV treated segment is
occluded. No early recanalization. Small sub surface anterior calf
and tibial varicosities remain patent. These would be amenable to
additional ultrasound foam sclerotherapy.

Venous Reflux:  None.

Other Findings:  None.
IMPRESSION: Negative for DVT.

Left GSV treated segment remains occluded.

Small anterior tibial residual varicosities.

## 2021-05-02 IMAGING — US US INJEC SCLEROTHERAPY MULT
1 series · 13 of 16 positions shown · non-contrast
Comparison: none

INDICATION: Left GSV venous insufficiency, 1 month status post EVLT. Residual
anterior tibial sub surface varicosities.

[Series 1: us injec sclerotherapy mult · 0.07mm/px · 42 acquisitions, 13 frames shown]
[im 1/42]
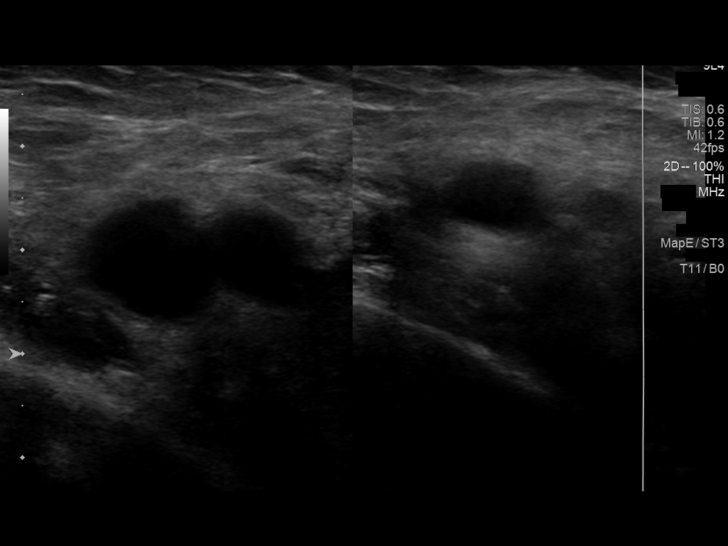
[im 3/42]
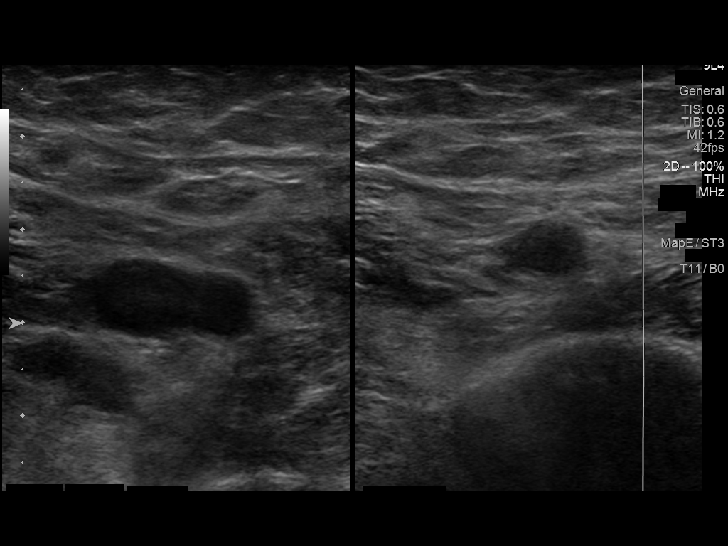
[im 9/42]
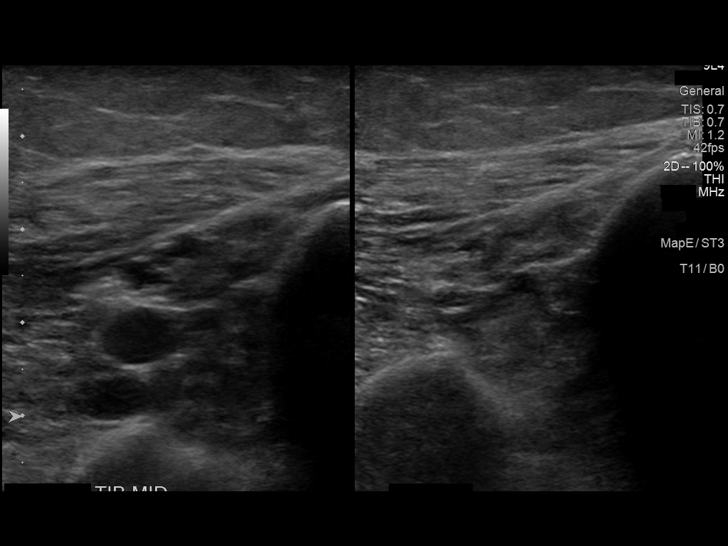
[im 11/42]
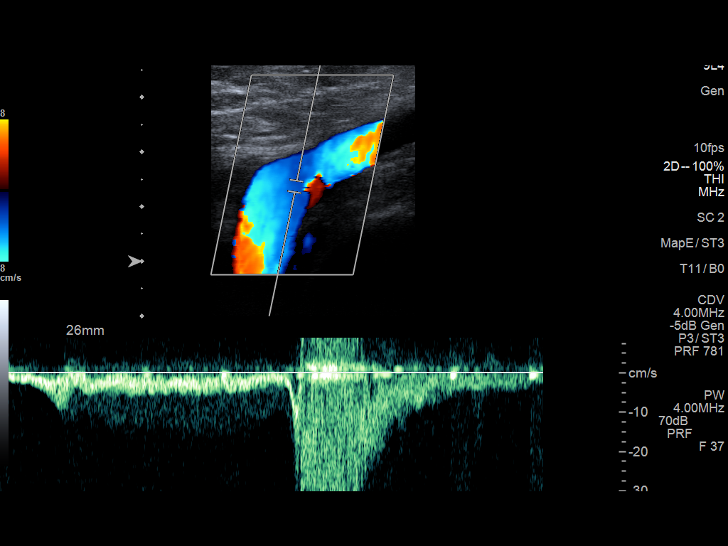
[im 14/42]
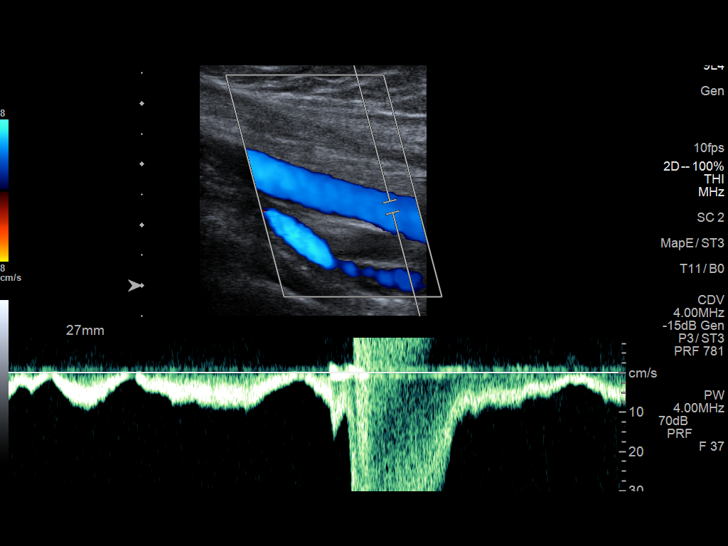
[im 17/42]
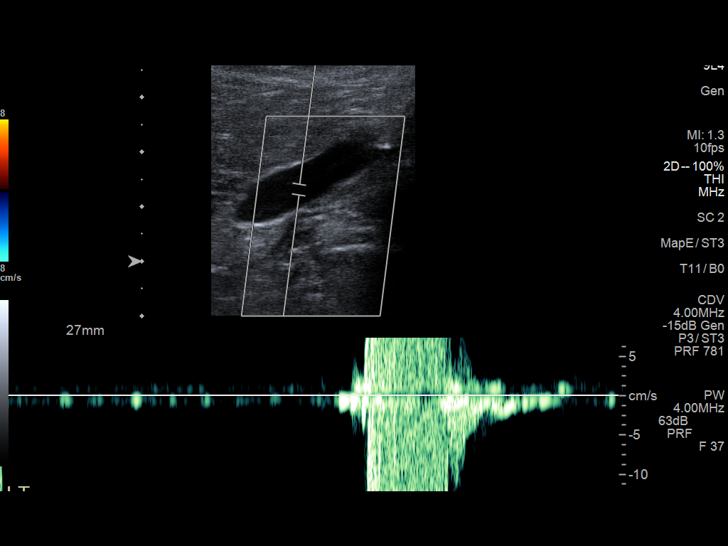
[im 22/42]
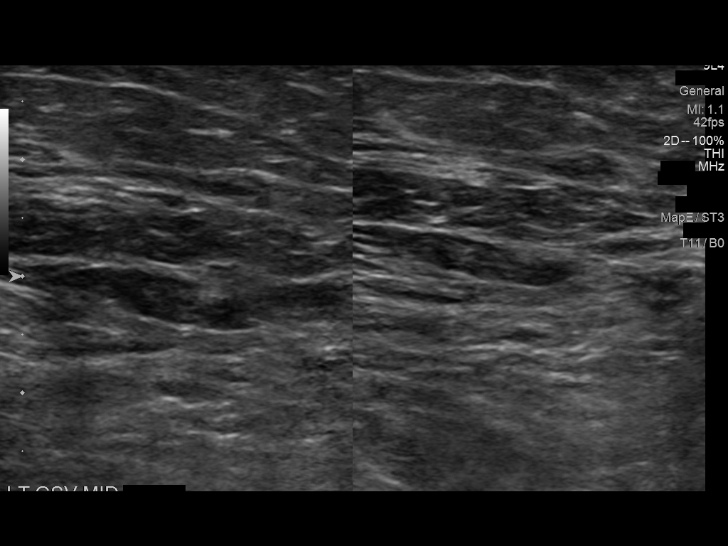
[im 25/42]
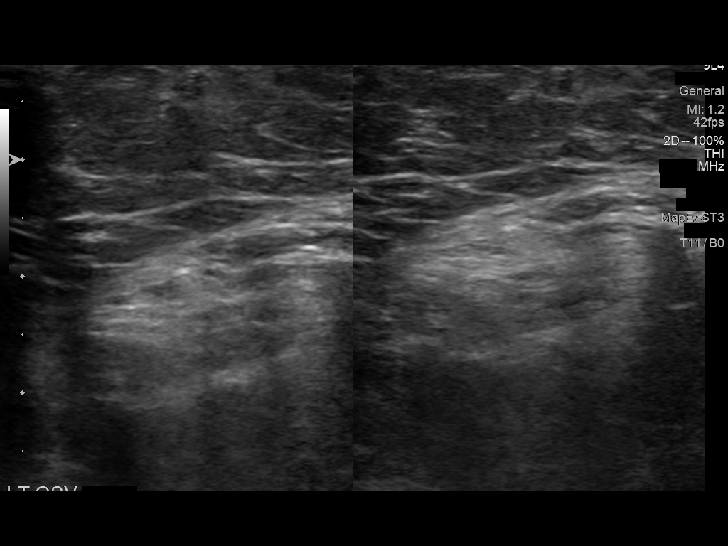
[im 28/42]
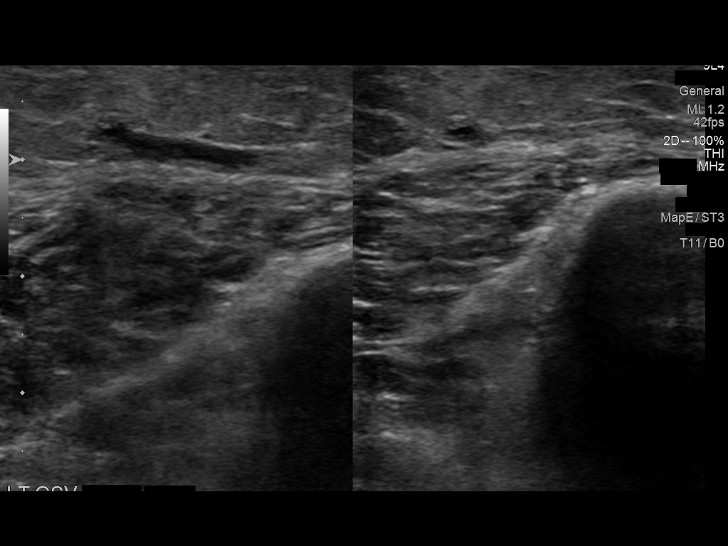
[im 31/42]
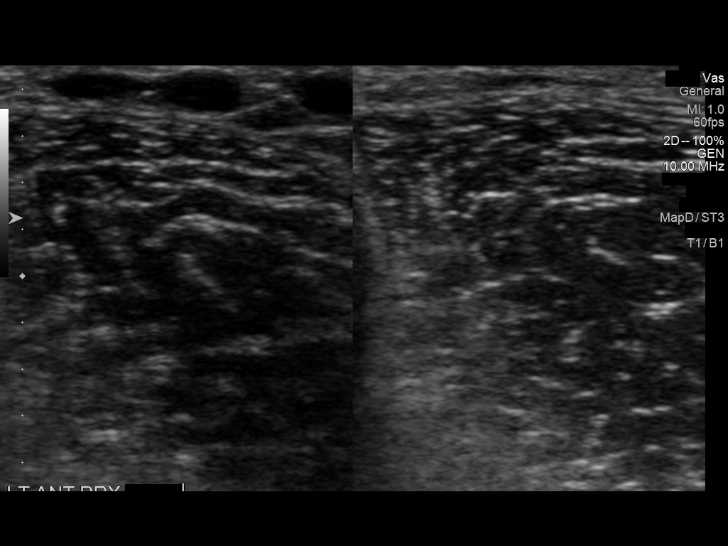
[im 33/42]
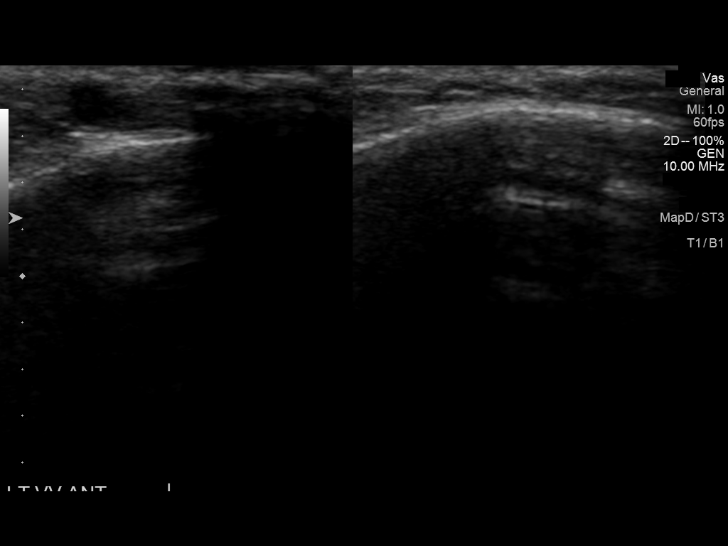
[im 39/42]
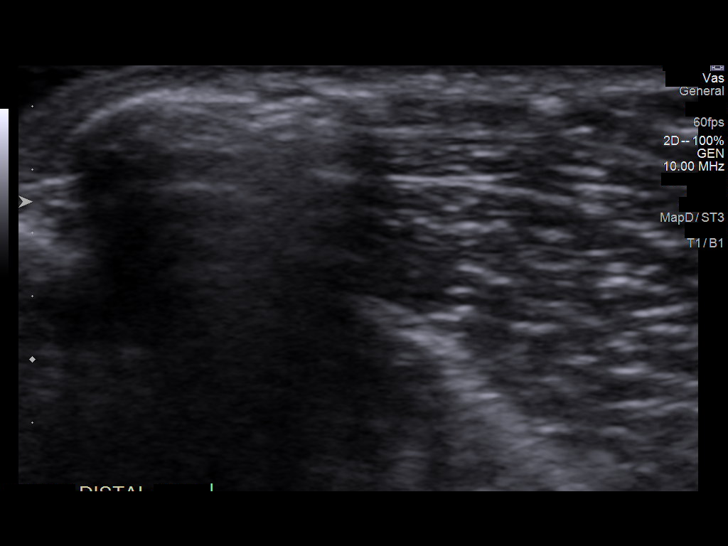
[im 42/42]
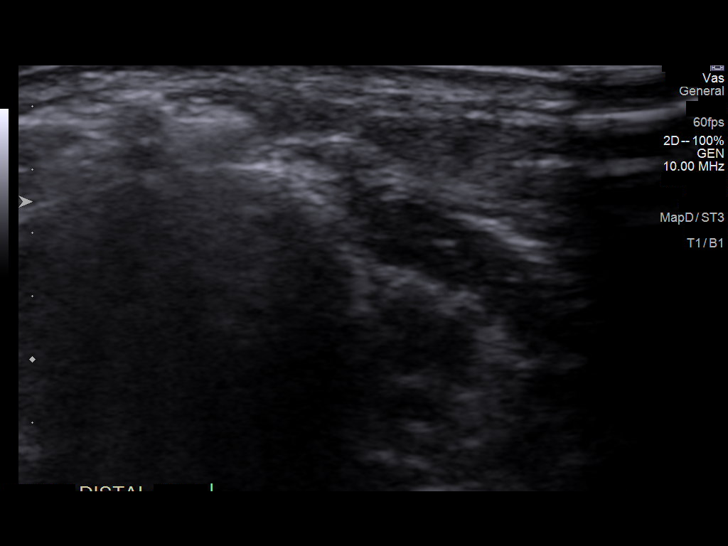

[13 of 16 positions shown; findings below may reference images not displayed]

EXAM:
ULTRASOUND INJECTION OF SCLEROSANT; MULTIPLE INCOMPETENT VEINS (SAME
LEG)

MEDICATIONS:
1% polidocanol

ANESTHESIA/SEDATION:
None.

COMPLICATIONS:
None immediate.

PROCEDURE:
Informed written consent was obtained from the patient after a
thorough discussion of the procedural risks, benefits and
alternatives. All questions were addressed. A timeout was performed
prior to the initiation of the procedure.

Survey ultrasound of the left lower extremity was performed and
appropriate skin entry sites were marked. The sites were then
prepped with ChloraPrep and draped in standard fashion.

Under direct ultrasound guidance a foam of 1% polidocanol was
injected in small aliquots into the residual patent symptomatic
anterior tibial varicose veins until there was good filling of the
affected regions.
FINDINGS: Persistent incompetent symptomatic varicose veins located left
anterior tibial region in the left lower extremity.

Injection of 2.0 mL sclerosant.
IMPRESSION: Technically successful foam sclerotherapy of residual symptomatic
left lower extremity varicose veins.

## 2021-05-18 DIAGNOSIS — D2262 Melanocytic nevi of left upper limb, including shoulder: Secondary | ICD-10-CM | POA: Diagnosis not present

## 2021-05-18 DIAGNOSIS — D2271 Melanocytic nevi of right lower limb, including hip: Secondary | ICD-10-CM | POA: Diagnosis not present

## 2021-05-18 DIAGNOSIS — D225 Melanocytic nevi of trunk: Secondary | ICD-10-CM | POA: Diagnosis not present

## 2021-05-18 DIAGNOSIS — L298 Other pruritus: Secondary | ICD-10-CM | POA: Diagnosis not present

## 2021-05-18 DIAGNOSIS — D2239 Melanocytic nevi of other parts of face: Secondary | ICD-10-CM | POA: Diagnosis not present

## 2021-05-18 DIAGNOSIS — D2261 Melanocytic nevi of right upper limb, including shoulder: Secondary | ICD-10-CM | POA: Diagnosis not present

## 2021-05-23 DIAGNOSIS — M898X8 Other specified disorders of bone, other site: Secondary | ICD-10-CM | POA: Diagnosis not present

## 2021-05-23 DIAGNOSIS — I10 Essential (primary) hypertension: Secondary | ICD-10-CM | POA: Diagnosis not present

## 2021-05-23 DIAGNOSIS — W5501XA Bitten by cat, initial encounter: Secondary | ICD-10-CM | POA: Diagnosis not present

## 2021-05-23 DIAGNOSIS — L03113 Cellulitis of right upper limb: Secondary | ICD-10-CM | POA: Diagnosis not present

## 2021-05-30 DIAGNOSIS — L988 Other specified disorders of the skin and subcutaneous tissue: Secondary | ICD-10-CM | POA: Diagnosis not present

## 2021-05-30 DIAGNOSIS — D485 Neoplasm of uncertain behavior of skin: Secondary | ICD-10-CM | POA: Diagnosis not present

## 2021-05-31 ENCOUNTER — Other Ambulatory Visit: Payer: Self-pay

## 2021-05-31 ENCOUNTER — Encounter: Payer: Self-pay | Admitting: Family Medicine

## 2021-05-31 ENCOUNTER — Ambulatory Visit: Payer: Medicare Other | Admitting: Family Medicine

## 2021-05-31 ENCOUNTER — Ambulatory Visit (INDEPENDENT_AMBULATORY_CARE_PROVIDER_SITE_OTHER): Payer: Medicare Other

## 2021-05-31 VITALS — BP 140/78 | HR 70 | Ht 69.0 in | Wt 151.8 lb

## 2021-05-31 DIAGNOSIS — M25552 Pain in left hip: Secondary | ICD-10-CM | POA: Diagnosis not present

## 2021-05-31 NOTE — Patient Instructions (Addendum)
Nice to meet you today.  I've referred you to Physical Therapy at [facility].  Their office will call you to scheduled but please let us know if you haven't heard from them in one week regarding scheduling.  Follow-up: 6 weeks

## 2021-05-31 NOTE — Progress Notes (Signed)
° °  I, Wendy Poet, LAT, ATC, am serving as scribe for Dr. Lynne Leader.  Subjective:    CC: L ant hip pain  HPI: Pt is a 71 y/o female c/o L ant hip pain since 05/20/21 w/ no known MOI. Pt locates pain to her L ant hip / iliac crest.  She reports that her symptoms are improving w/ time and rates her improvement at 60%.  She notes that she had similar pain when she started taking Fosamax.  Radiates: no Aggravates: Treatments tried: heat, walking, tylenol  Pertinent review of Systems: No fevers or chills  Relevant historical information: History of pseudogout well managed with intermittent prednisone.  Pseudogout primarily occurs in the feet   Objective:    Vitals:   05/31/21 1531  BP: 140/78  Pulse: 70  SpO2: 95%   General: Well Developed, well nourished, and in no acute distress.   MSK: Left hip normal-appearing Minimally tender at ASIS. Normal hip motion. No pain with resisted hip flexion with the hip toe up and externally rotated. Hip flexion strength is preserved. External rotation and hip abduction strength is diminished and painful 4/5.  Lab and Radiology Results  X-ray images left hip obtained today personally and independently interpreted. No visible abnormality at ASIS or into the ilium. No acute fractures are present. 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 Await formal radiology review    Impression and Recommendations:    Assessment and Plan: 71 y.o. female with left superior anterior hip pain to the lateral superior hip pain.  Patient has pain around the ASIS to the superior lateral iliac crest.  I think her pain is primarily associate with tensor fascia lata insertion. She already is improving after about a week or 2 of rest.  She is a great candidate for physical therapy.  Plan to refer to PT.  She does have a history of pseudogout and per my read on her x-ray has chondrocalcinosis in the hip joint which may be a factor here.  I offered  colchicine prescription which she declined.  Certainly that something we can try in the future.  Check back in 6 weeks or sooner if needed.  PDMP not reviewed this encounter. Orders Placed This Encounter  Procedures   DG HIP UNILAT W OR W/O PELVIS 2-3 VIEWS LEFT    Standing Status:   Future    Number of Occurrences:   1    Standing Expiration Date:   07/01/2021    Order Specific Question:   Reason for Exam (SYMPTOM  OR DIAGNOSIS REQUIRED)    Answer:   L hip pain    Order Specific Question:   Preferred imaging location?    Answer:   [facility]   Ambulatory referral to Physical Therapy    Referral Priority:   Routine    Referral Type:   Physical Medicine    Referral Reason:   Specialty Services Required    Requested Specialty:   Physical Therapy    Number of Visits Requested:   1   No orders of the defined types were placed in this encounter.   Discussed warning signs or symptoms. Please see discharge instructions. Patient expresses understanding.   The above documentation has been reviewed and is accurate and complete Lynne Leader, M.D.

## 2021-06-01 NOTE — Progress Notes (Signed)
Hip x-ray shows some mild hip arthritis.  Radiology does not comment on pseudogout.

## 2021-06-08 ENCOUNTER — Ambulatory Visit: Payer: Medicare Other | Admitting: Physical Therapy

## 2021-06-08 ENCOUNTER — Encounter: Payer: Self-pay | Admitting: Physical Therapy

## 2021-06-08 ENCOUNTER — Other Ambulatory Visit: Payer: Self-pay

## 2021-06-08 DIAGNOSIS — M25552 Pain in left hip: Secondary | ICD-10-CM | POA: Diagnosis not present

## 2021-06-08 DIAGNOSIS — M6281 Muscle weakness (generalized): Secondary | ICD-10-CM | POA: Diagnosis not present

## 2021-06-08 NOTE — Therapy (Signed)
OUTPATIENT PHYSICAL THERAPY LOWER EXTREMITY EVALUATION   Patient Name: ERTHA NABOR MRN: [id number] DOB:02-13-1951, 71 y.o., female Today's Date: 06/08/2021   PT End of Session - 06/08/21 1101     Visit Number 1    Number of Visits 12    Date for PT Re-Evaluation 08/03/21    Authorization Type BCBS    Progress Note Due on Visit 10    PT Start Time 1104    PT Stop Time 1141    PT Time Calculation (min) 37 min             Past Medical History:  Diagnosis Date   Abnormal stress test    a. 09/2008, ST depression on ETT @ [facility], no further w/u. // Cath 8/14: no CAD   Adenomatous colon polyp    Anxiety and depression    Aortic insufficiency    a. 06/2012 Echo: EF 55-60%, Gr 1 DD, Mild AI, Triv MR, mildly dil LA, nl RV fxn. // Echo 03/2019: EF 60-65, normal RVSF, mod AI, trace MR, trivial TR, Asc Aorta 38 mm, GLS -18.1%   Enlarged thoracic aorta (HCC)    MRA 4/19: 4.1 cm // CT 09/2019: 4 cm // Chest CTA 5/22: Ascending aorta 3.8 cm    History of melanoma    Followed with Dr. Tonia Brooms   HTN (hypertension)    Hyperlipidemia    Leukocytosis    Mild   Migraines    Osteopenia    Paroxysmal atrial fibrillation ([place])    admx in 09/2019 w/ choledocho lithiasis >> ERCP // several days later presented to ED with AF with RVR >> DCCV in ED unsuccessful; CHADS-VASc 4 >> Apixaban started for at least 1 month - ?? long term Rx   Varicose veins    Past Surgical History:  Procedure Laterality Date   BTL     CHOLECYSTECTOMY     DILATION AND CURETTAGE OF UTERUS     ERCP N/A 09/17/2019   Procedure: ENDOSCOPIC RETROGRADE CHOLANGIOPANCREATOGRAPHY (ERCP);  Surgeon: Ronnette Juniper, MD;  Location: [place];  Service: Gastroenterology;  Laterality: N/A;   FEMORAL HERNIA REPAIR     High endocervical polyp removed     IR EMBO VENOUS NOT HEMORR HEMANG  INC GUIDE ROADMAPPING  07/20/2020   LIPOMA EXCISION Right 03/23/2015   Procedure: EXCISION LARGE LIPOMA RIGHT UPPER ARM  ;  Surgeon: Youlanda Roys, MD;  Location: [place];  Service: Plastics;  Laterality: Right;   MELANOMA EXCISION  2003   right calf   OVARIAN CYST REMOVAL     REMOVAL OF STONES  09/17/2019   Procedure: REMOVAL OF STONES;  Surgeon: Ronnette Juniper, MD;  Location: [facility] ENDOSCOPY;  Service: Gastroenterology;;   [name]  09/17/2019   Procedure: SPHINCTEROTOMY;  Surgeon: Ronnette Juniper, MD;  Location: [facility] ENDOSCOPY;  Service: Gastroenterology;;   Patient Active Problem List   Diagnosis Date Noted   Atrial fibrillation with RVR ([place])    Anxiety and depression    Choledocholithiasis    Elevated liver enzymes    Hypotension    Osteopenia    Leukocytopenia 06/23/2014   HYPERLIPIDEMIA-MIXED 10/12/2009   HYPERTENSION, BENIGN 10/12/2009   AORTIC INSUFFICIENCY 10/12/2009   ANXIETY DEPRESSION 10/08/2009   GERD 10/08/2009   PMS 10/08/2009   Disorder of bone and cartilage 10/08/2009   COLONIC POLYPS, ADENOMATOUS, HX OF 10/08/2009   MIGRAINES, HX OF 10/08/2009    PCP: Deland Pretty, MD  REFERRING PROVIDER: Gregor Hams, MD  REFERRING DIAG: M25.552 (ICD-10-CM) - Left hip pain  THERAPY DIAG:  Pain in left hip  Muscle weakness (generalized)  ONSET DATE: 05/20/21 no MOI  SUBJECTIVE:   SUBJECTIVE STATEMENT:  States that she woke up with left hip pain and has progressively improved over time. States that this morning her right hip started to bother her. Started out as a pinching kind of pain. States she used to go to [employer] but stopped recently as her cat was not doing well and recently passed away.  PERTINENT HISTORY: anixeity, migraines, pseudogout,  HTN, leaky valve, arrhythmia, right knee OA and ROM limitations  PAIN:  Are you having pain? Yes NPRS scale: 5/10 Pain location: left and right with left more Pain orientation: Right, Left, and Anterior  PAIN TYPE: aching and tight Pain description: intermittent  Aggravating factors: AM,  Relieving factors: heat,  walking  PRECAUTIONS: None  WEIGHT BEARING RESTRICTIONS No  FALLS:  Has patient fallen in last 6 months? 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   COGNITION:  Overall cognitive status: Within functional limits for tasks assessed   Observation:   Swelling at knee joint   Left 39.0 cm Right 40.25cm   MUSCLE LENGTH: Hamstrings: Right 30 deg; Left 20 deg   PALPATION: tenderness to palpation in left anterior hip   LE Measurements: Lower Extremity Right 06/08/2021 Left 06/08/2021   A/PROM MMT A/PROM MMT  Hip Flexion 100** 4+ 110 4  Hip Extension  4  4  Hip Abduction      Hip Adduction      Hip Internal rotation 30**  45   Hip External rotation 45**  55   Knee Flexion 126 4+ 140 4+  Knee Extension 8 (lacking) 4* 0 4+  Ankle Dorsiflexion      Ankle Plantarflexion      Ankle Inversion      Ankle Eversion       (Blank rows = not tested) * = pain ** = guarding    GAIT:  Comments: reduced right TKE, antalgic gait, limited hip mobility    TODAY'S TREATMENT: Therapeutic Exercise:  Aerobic: Supine: bridge 2x10 5" holds, heel slide x10 R 5" holds Prone:  Seated:heel slide x10 R 5" holds  Standing: hip flexor stretch x10 10" holds L   PATIENT EDUCATION:  Education details: on current presentation, on HEP and POC Person educated: Patient Education method: Consulting civil engineer, Media planner, and Handouts Education comprehension: verbalized understanding    HOME EXERCISE PROGRAM: [id number]  ASSESSMENT:  CLINICAL IMPRESSION: Patient is a 71 y.o. female who was seen today for physical therapy evaluation and treatment for left hip pain. Objective impairments include Abnormal gait, decreased  activity tolerance, decreased endurance, difficulty walking, decreased ROM, decreased strength, increased edema, and pain. These impairments are limiting patient from cleaning, community activity, and yard work. Personal factors including Age and Fitness are also affecting patient's functional outcome. Patient will benefit from skilled PT to address above impairments and improve overall function.  REHAB POTENTIAL: Good  CLINICAL DECISION MAKING: Stable/uncomplicated  EVALUATION COMPLEXITY: Low   GOALS: Goals reviewed with patient? Yes  SHORT TERM GOALS:  STG Name Target Date Goal status  1 Patient will be independent in self management  strategies to improve quality of life and functional outcomes. Baseline:  07/06/2021  INITIAL  2 Patient will report at least 50% improvement in overall symptoms and/or function to demonstrate improved functional mobility Baseline: 0% 07/06/2021  INITIAL  3 Patient will be able to demonstrate at least 5-130 degrees of right knee ROM Baseline: 07/06/2021  INITIAL  4  Baseline:    5  Baseline:    6  Baseline:    7  Baseline:     LONG TERM GOALS:   LTG Name Target Date Goal status  1 Patient will report at least 75% improvement in overall symptoms and/or function to demonstrate improved functional mobility Baseline:0% 08/03/2021  INITIAL  2 Patient will demonstrate reduced swelling in knee by having measurements within  cm of each other Baseline: 08/03/2021  INITIAL  3 Patient will report returning to [facility] for return to PLOF Baseline: 08/03/2021  INITIAL  4  Baseline:    5  Baseline:    6  Baseline:    7  Baseline:       PLAN: PT FREQUENCY: 1-2x/week for total of 12 visits  PT DURATION: 8 weeks  PLANNED INTERVENTIONS: Therapeutic exercises, Therapeutic activity, Neuro Muscular re-education, Balance training, Gait training, Patient/Family education, Joint mobilization, Aquatic Therapy, Dry Needling, Cryotherapy, Moist heat, Ionotophoresis  4mg /ml Dexamethasone, and Manual therapy  PLAN FOR NEXT SESSION: right knee mobility, hip strengthening, hip flexor stretching  11:47 AM, 06/08/21 Jerene Pitch, DPT Physical Therapy with [facility]  [phone] office

## 2021-06-13 NOTE — Therapy (Signed)
OUTPATIENT PHYSICAL THERAPY TREATMENT NOTE   Patient Name: Kristine Jones MRN: [id number] DOB:11-Sep-1950, 71 y.o., female Today's Date: 06/14/2021  PCP: Deland Pretty, MD REFERRING PROVIDER: Deland Pretty, MD   PT End of Session - 06/14/21 1220     Visit Number 2    Number of Visits 12    Date for PT Re-Evaluation 08/03/21    Authorization Type BCBS    Progress Note Due on Visit 10    PT Start Time 1220    PT Stop Time 1300    PT Time Calculation (min) 40 min             Past Medical History:  Diagnosis Date   Abnormal stress test    a. 09/2008, ST depression on ETT @ [facility], no further w/u. // Cath 8/14: no CAD   Adenomatous colon polyp    Anxiety and depression    Aortic insufficiency    a. 06/2012 Echo: EF 55-60%, Gr 1 DD, Mild AI, Triv MR, mildly dil LA, nl RV fxn. // Echo 03/2019: EF 60-65, normal RVSF, mod AI, trace MR, trivial TR, Asc Aorta 38 mm, GLS -18.1%   Enlarged thoracic aorta ([place])    MRA 4/19: 4.1 cm // CT 09/2019: 4 cm // Chest CTA 5/22: Ascending aorta 3.8 cm    History of melanoma    Followed with Dr. Tonia Brooms   HTN (hypertension)    Hyperlipidemia    Leukocytosis    Mild   Migraines    Osteopenia    Paroxysmal atrial fibrillation ([place])    admx in 09/2019 w/ choledocho lithiasis >> ERCP // several days later presented to ED with AF with RVR >> DCCV in ED unsuccessful; CHADS-VASc 4 >> Apixaban started for at least 1 month - ?? long term Rx   Varicose veins    Past Surgical History:  Procedure Laterality Date   BTL     CHOLECYSTECTOMY     DILATION AND CURETTAGE OF UTERUS     ERCP N/A 09/17/2019   Procedure: ENDOSCOPIC RETROGRADE CHOLANGIOPANCREATOGRAPHY (ERCP);  Surgeon: Ronnette Juniper, MD;  Location: [place];  Service: Gastroenterology;  Laterality: N/A;   FEMORAL HERNIA REPAIR     High endocervical polyp removed     IR EMBO VENOUS NOT HEMORR HEMANG  INC GUIDE ROADMAPPING  07/20/2020   LIPOMA EXCISION Right 03/23/2015   Procedure:  EXCISION LARGE LIPOMA RIGHT UPPER ARM  ;  Surgeon: Youlanda Roys, MD;  Location: [place];  Service: Plastics;  Laterality: Right;   MELANOMA EXCISION  2003   right calf   OVARIAN CYST REMOVAL     REMOVAL OF STONES  09/17/2019   Procedure: REMOVAL OF STONES;  Surgeon: Ronnette Juniper, MD;  Location: [facility] ENDOSCOPY;  Service: Gastroenterology;;   [name]  09/17/2019   Procedure: SPHINCTEROTOMY;  Surgeon: Ronnette Juniper, MD;  Location: [facility] ENDOSCOPY;  Service: Gastroenterology;;   Patient Active Problem List   Diagnosis Date Noted   Atrial fibrillation with RVR ([place])    Anxiety and depression    Choledocholithiasis    Elevated liver enzymes    Hypotension    Osteopenia    Leukocytopenia 06/23/2014   HYPERLIPIDEMIA-MIXED 10/12/2009   HYPERTENSION, BENIGN 10/12/2009   AORTIC INSUFFICIENCY 10/12/2009   ANXIETY DEPRESSION 10/08/2009   GERD 10/08/2009   PMS 10/08/2009   Disorder of bone and cartilage 10/08/2009   COLONIC POLYPS, ADENOMATOUS, HX OF 10/08/2009   MIGRAINES, HX OF 10/08/2009  PCP: Deland Pretty, MD  REFERRING PROVIDER: Gregor Hams, MD   REFERRING DIAG: [phone] (ICD-10-CM) - Left hip pain   THERAPY DIAG:  Pain in left hip   Muscle weakness (generalized)   ONSET DATE: 05/20/21 no MOI   SUBJECTIVE:    SUBJECTIVE STATEMENT:  States that she woke up with left hip pain and has progressively improved over time. States that this morning her right hip started to bother her. Started out as a pinching kind of pain. States she used to go to [employer] but stopped recently as her cat was not doing well and recently passed away.  06/21/21 States she has been doing her exercises but not as much as she would like. States that her left side comes and goes.    PERTINENT HISTORY: anixeity, migraines, pseudogout,  HTN, leaky valve, arrhythmia, right knee OA and ROM limitations   PAIN:  Are you having pain? 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     COGNITION:          Overall cognitive status: Within functional limits for tasks assessed              Observation:           Swelling at knee joint                    Left 39.0 cm Right 40.25cm     MUSCLE LENGTH: Hamstrings: Right 30 deg; Left 20 deg     PALPATION: tenderness to palpation in left anterior hip    LE Measurements:       Lower Extremity Right 06/08/2021 Left 06/08/2021    A/PROM MMT A/PROM MMT  Hip Flexion 100** 4+ 110 4  Hip Extension   4   4  Hip Abduction          Hip Adduction          Hip Internal rotation 30**   45    Hip External rotation 45**   55    Knee Flexion 126 4+ 140 4+  Knee Extension 8 (lacking) 4* 0 4+  Ankle Dorsiflexion          Ankle Plantarflexion          Ankle Inversion          Ankle Eversion           (Blank rows = not tested) * = pain ** = guarding       GAIT:   Comments: reduced right TKE, antalgic gait, limited hip mobility       TODAY'S TREATMENT: 21-Jun-2021 Therapeutic Exercise:           Aerobic: Supine: bridge 2x10 5" holds, heel slide x10 R 5" holds, SLR Bilateral 2x10, glute squeezes x10 5" holds  press ups 4 minutes total, knee flexion 2x10 B  06/08/21 Therapeutic Exercise:           Aerobic: Supine: bridge 2x10 5" holds, heel slide x10 R 5" holds Prone:  Seated:heel slide x10 R 5" holds           Standing: hip flexor stretch x10 10" holds L     PATIENT EDUCATION:  Education details:on rationale for exercises, on HEP Person educated:  Patient Education method: Explanation, Demonstration, and Handouts Education comprehension: verbalized understanding       HOME EXERCISE PROGRAM: [id number]   ASSESSMENT:   CLINICAL IMPRESSION: Patient is a 71 y.o. female who was seen today for physical therapy evaluation and treatment for left hip pain. Objective impairments include Abnormal gait, decreased activity tolerance, decreased endurance, difficulty walking, decreased ROM, decreased strength, increased edema, and pain. These impairments are limiting patient from cleaning, community activity, and yard work. Personal factors including Age and Fitness are also affecting patient's functional outcome. Patient will benefit from skilled PT to address above impairments and improve overall function.  06/14/2021 Session focused on review of HEP and progressing exercises as tolerated.Cues to engage quad with SLR and this helped reduce pain/pressing at the hip. Added all new exercises to HEP and no pain reported end of session. Will continue with current POC.    REHAB POTENTIAL: Good   CLINICAL DECISION MAKING: Stable/uncomplicated   EVALUATION COMPLEXITY: Low     GOALS: Goals reviewed with patient? Yes   SHORT TERM GOALS:   STG Name Target Date Goal status  1 Patient will be independent in self management strategies to improve quality of life and functional outcomes. Baseline:  07/06/2021  INITIAL  2 Patient will report at least 50% improvement in overall symptoms and/or function to demonstrate improved functional mobility Baseline: 0% 07/06/2021  INITIAL  3 Patient will be able to demonstrate at least 5-130 degrees of right knee ROM Baseline: 07/06/2021  INITIAL  4   Baseline:      5   Baseline:      6   Baseline:      7   Baseline:        LONG TERM GOALS:    LTG Name Target Date Goal status  1 Patient will report at least 75% improvement in overall symptoms and/or function to demonstrate improved functional  mobility Baseline:0% 08/03/2021  INITIAL  2 Patient will demonstrate reduced swelling in knee by having measurements within  cm of each other Baseline: 08/03/2021  INITIAL  3 Patient will report returning to [facility] for return to PLOF Baseline: 08/03/2021  INITIAL  4   Baseline:      5   Baseline:      6   Baseline:      7   Baseline:            PLAN: PT FREQUENCY: 1-2x/week for total of 12 visits   PT DURATION: 8 weeks   PLANNED INTERVENTIONS: Therapeutic exercises, Therapeutic activity, Neuro Muscular re-education, Balance training, Gait training, Patient/Family education, Joint mobilization, Aquatic Therapy, Dry Needling, Cryotherapy, Moist heat, Ionotophoresis 4mg /ml Dexamethasone, and Manual therapy   PLAN FOR NEXT SESSION: right knee mobility, hip strengthening, hip flexor stretching   11:47 AM, 06/08/21 Jerene Pitch, DPT Physical Therapy with [facility]  [phone] office

## 2021-06-14 ENCOUNTER — Ambulatory Visit: Payer: Medicare Other | Admitting: Physical Therapy

## 2021-06-14 ENCOUNTER — Other Ambulatory Visit: Payer: Self-pay

## 2021-06-14 ENCOUNTER — Encounter: Payer: Self-pay | Admitting: Physical Therapy

## 2021-06-14 DIAGNOSIS — M25552 Pain in left hip: Secondary | ICD-10-CM | POA: Diagnosis not present

## 2021-06-14 DIAGNOSIS — M6281 Muscle weakness (generalized): Secondary | ICD-10-CM | POA: Diagnosis not present

## 2021-06-15 ENCOUNTER — Other Ambulatory Visit: Payer: Self-pay | Admitting: Interventional Radiology

## 2021-06-15 DIAGNOSIS — I872 Venous insufficiency (chronic) (peripheral): Secondary | ICD-10-CM

## 2021-06-16 ENCOUNTER — Encounter: Payer: Self-pay | Admitting: Physical Therapy

## 2021-06-16 ENCOUNTER — Ambulatory Visit: Payer: Medicare Other | Admitting: Physical Therapy

## 2021-06-16 ENCOUNTER — Other Ambulatory Visit: Payer: Self-pay

## 2021-06-16 DIAGNOSIS — M25552 Pain in left hip: Secondary | ICD-10-CM

## 2021-06-16 DIAGNOSIS — M6281 Muscle weakness (generalized): Secondary | ICD-10-CM

## 2021-06-16 NOTE — Therapy (Signed)
OUTPATIENT PHYSICAL THERAPY TREATMENT NOTE   Patient Name: Kristine Jones MRN: [id number] DOB:01-26-1951, 71 y.o., female Today's Date: 06/16/2021  PCP: Deland Pretty, MD REFERRING PROVIDER: Deland Pretty, MD   PT End of Session - 06/16/21 1016     Visit Number 3    Number of Visits 12    Date for PT Re-Evaluation 08/03/21    Authorization Type BCBS    Progress Note Due on Visit 10    PT Start Time 1018    PT Stop Time 1058    PT Time Calculation (min) 40 min             Past Medical History:  Diagnosis Date   Abnormal stress test    a. 09/2008, ST depression on ETT @ [facility], no further w/u. // Cath 8/14: no CAD   Adenomatous colon polyp    Anxiety and depression    Aortic insufficiency    a. 06/2012 Echo: EF 55-60%, Gr 1 DD, Mild AI, Triv MR, mildly dil LA, nl RV fxn. // Echo 03/2019: EF 60-65, normal RVSF, mod AI, trace MR, trivial TR, Asc Aorta 38 mm, GLS -18.1%   Enlarged thoracic aorta ([place])    MRA 4/19: 4.1 cm // CT 09/2019: 4 cm // Chest CTA 5/22: Ascending aorta 3.8 cm    History of melanoma    Followed with Dr. Tonia Brooms   HTN (hypertension)    Hyperlipidemia    Leukocytosis    Mild   Migraines    Osteopenia    Paroxysmal atrial fibrillation ([place])    admx in 09/2019 w/ choledocho lithiasis >> ERCP // several days later presented to ED with AF with RVR >> DCCV in ED unsuccessful; CHADS-VASc 4 >> Apixaban started for at least 1 month - ?? long term Rx   Varicose veins    Past Surgical History:  Procedure Laterality Date   BTL     CHOLECYSTECTOMY     DILATION AND CURETTAGE OF UTERUS     ERCP N/A 09/17/2019   Procedure: ENDOSCOPIC RETROGRADE CHOLANGIOPANCREATOGRAPHY (ERCP);  Surgeon: Ronnette Juniper, MD;  Location: [place];  Service: Gastroenterology;  Laterality: N/A;   FEMORAL HERNIA REPAIR     High endocervical polyp removed     IR EMBO VENOUS NOT HEMORR HEMANG  INC GUIDE ROADMAPPING  07/20/2020   LIPOMA EXCISION Right 03/23/2015   Procedure:  EXCISION LARGE LIPOMA RIGHT UPPER ARM  ;  Surgeon: Youlanda Roys, MD;  Location: [place];  Service: Plastics;  Laterality: Right;   MELANOMA EXCISION  2003   right calf   OVARIAN CYST REMOVAL     REMOVAL OF STONES  09/17/2019   Procedure: REMOVAL OF STONES;  Surgeon: Ronnette Juniper, MD;  [facility] END[facility];  Service: Gastroenterology;;   [name]  09/17/2019   Procedure: SPHINCTEROTOMY;  Surgeon: Ronnette Juniper, MD;  Location: [facility] ENDOSCOPY;  Service: Gastroenterology;;   Patient Active Problem List   Diagnosis Date Noted   Atrial fibrillation with RVR ([place])    Anxiety and depression    Choledocholithiasis    Elevated liver enzymes    Hypotension    Osteopenia    Leukocytopenia 06/23/2014   HYPERLIPIDEMIA-MIXED 10/12/2009   HYPERTENSION, BENIGN 10/12/2009   AORTIC INSUFFICIENCY 10/12/2009   ANXIETY DEPRESSION 10/08/2009   GERD 10/08/2009   PMS 10/08/2009   Disorder of bone and cartilage 10/08/2009   COLONIC POLYPS, ADENOMATOUS, HX OF 10/08/2009   MIGRAINES, HX OF 10/08/2009  PCP: Deland Pretty, MD  REFERRING PROVIDER: Gregor Hams, MD   REFERRING DIAG: [phone] (ICD-10-CM) - Left hip pain   THERAPY DIAG:  Pain in left hip   Muscle weakness (generalized)   ONSET DATE: 05/20/21 no MOI   SUBJECTIVE:    SUBJECTIVE STATEMENT:  States that she woke up with left hip pain and has progressively improved over time. States that this morning her right hip started to bother her. Started out as a pinching kind of pain. States she used to go to [employer] but stopped recently as her cat was not doing well and recently passed away.  29-Jun-2021 States that yesterday her knee was hurting up in her thigh. States that when she did her prone on elbow stretch it hurt her right knee .    PERTINENT HISTORY: anixeity, migraines, pseudogout,  HTN, leaky valve, arrhythmia, right knee OA and ROM limitations   PAIN:  Are you having pain? 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     COGNITION:          Overall cognitive status: Within functional limits for tasks assessed              Observation:           Swelling at knee joint                    Left 39.0 cm Right 40.25cm     MUSCLE LENGTH: Hamstrings: Right 30 deg; Left 20 deg     PALPATION: tenderness to palpation in left anterior hip    LE Measurements:       Lower Extremity Right 06/08/2021 Left 06/08/2021    A/PROM MMT A/PROM MMT  Hip Flexion 100** 4+ 110 4  Hip Extension   4   4  Hip Abduction          Hip Adduction          Hip Internal rotation 30**   45    Hip External rotation 45**   55    Knee Flexion 126 4+ 140 4+  Knee Extension 8 (lacking) 4* 0 4+  Ankle Dorsiflexion          Ankle Plantarflexion          Ankle Inversion          Ankle Eversion           (Blank rows = not tested) * = pain ** = guarding       GAIT:   Comments: reduced right TKE, antalgic gait, limited hip mobility       TODAY'S TREATMENT: 29-Jun-2021 Therapeutic Exercise: Supine:TKE 3 minutes total for stretch, SAQ on ball - focus on TKE - 3 minutes total. Hamstring stretch x3 30" holds Seated self patella mobilizations - PT overhand assist - bilaterally, reviewed HEP and focus moving forward  Manual: STM to right knee - quad and hamstring, tibial mobilization on ball - right - tolerated well, patella mobilization in all directions - tolerated well -   06/08/21 Therapeutic Exercise:  Aerobic: Supine: bridge 2x10 5"  holds, heel slide x10 R 5" holds Prone:           Seated:heel slide x10 R 5" holds           Standing: hip flexor stretch x10 10" holds L     PATIENT EDUCATION:  Education details:on anatomy, walking mechanics, on fear avoidance and feed forward mechanism associated with that. Person educated: Patient Education method: Explanation, Demonstration, and Handouts Education comprehension: verbalized understanding       HOME EXERCISE PROGRAM: [id number]   ASSESSMENT:   CLINICAL IMPRESSION: Patient is a 71 y.o. female who was seen today for physical therapy evaluation and treatment for left hip pain. Objective impairments include Abnormal gait, decreased activity tolerance, decreased endurance, difficulty walking, decreased ROM, decreased strength, increased edema, and pain. These impairments are limiting patient from cleaning, community activity, and yard work. Personal factors including Age and Fitness are also affecting patient's functional outcome. Patient will benefit from skilled PT to address above impairments and improve overall function.  06/16/2021 Focus on right knee/leg and education on how right knee can be contributing to left hip problems. Educated on walking mechanics, fear avoidance with motion and tactile stimulation. Instructed patient to add self massage to right knee to help with fear of pain with palpation. No other exercises added to HEP on this date. No pain noted end of session.     REHAB POTENTIAL: Good   CLINICAL DECISION MAKING: Stable/uncomplicated   EVALUATION COMPLEXITY: Low     GOALS: Goals reviewed with patient? Yes   SHORT TERM GOALS:   STG Name Target Date Goal status  1 Patient will be independent in self management strategies to improve quality of life and functional outcomes. Baseline:  07/06/2021  INITIAL  2 Patient will report at least 50% improvement in overall symptoms and/or function to demonstrate improved functional mobility Baseline: 0%  07/06/2021  INITIAL  3 Patient will be able to demonstrate at least 5-130 degrees of right knee ROM Baseline: 07/06/2021  INITIAL  4   Baseline:      5   Baseline:      6   Baseline:      7   Baseline:        LONG TERM GOALS:    LTG Name Target Date Goal status  1 Patient will report at least 75% improvement in overall symptoms and/or function to demonstrate improved functional mobility Baseline:0% 08/03/2021  INITIAL  2 Patient will demonstrate reduced swelling in knee by having measurements within  cm of each other Baseline: 08/03/2021  INITIAL  3 Patient will report returning to [facility] for return to PLOF Baseline: 08/03/2021  INITIAL  4   Baseline:      5   Baseline:      6   Baseline:      7   Baseline:            PLAN: PT FREQUENCY: 1-2x/week for total of 12 visits   PT DURATION: 8 weeks   PLANNED INTERVENTIONS: Therapeutic exercises, Therapeutic activity, Neuro Muscular re-education, Balance training, Gait training, Patient/Family education, Joint mobilization, Aquatic Therapy, Dry Needling, Cryotherapy, Moist heat, Ionotophoresis 4mg /ml Dexamethasone, and Manual therapy   PLAN FOR NEXT SESSION: right knee mobility, hip strengthening, hip flexor stretching, trial heel lift under right leg   11:47 AM, 06/08/21 Jerene Pitch, DPT Physical Therapy with [facility]  [phone] office

## 2021-06-17 ENCOUNTER — Encounter: Payer: Self-pay | Admitting: Family Medicine

## 2021-06-20 ENCOUNTER — Encounter: Payer: Medicare Other | Admitting: Physical Therapy

## 2021-06-21 ENCOUNTER — Other Ambulatory Visit: Payer: Self-pay

## 2021-06-21 ENCOUNTER — Ambulatory Visit: Payer: Medicare Other | Admitting: Physical Therapy

## 2021-06-21 ENCOUNTER — Encounter: Payer: Self-pay | Admitting: Physical Therapy

## 2021-06-21 DIAGNOSIS — M25552 Pain in left hip: Secondary | ICD-10-CM | POA: Diagnosis not present

## 2021-06-21 DIAGNOSIS — M6281 Muscle weakness (generalized): Secondary | ICD-10-CM | POA: Diagnosis not present

## 2021-06-21 NOTE — Therapy (Signed)
OUTPATIENT PHYSICAL THERAPY TREATMENT NOTE   Patient Name: Kristine Jones MRN: [id number] DOB:[birth year], 71 y.o., female Today's Date: 06/21/2021  PCP: Kristine Pretty, MD REFERRING PROVIDER: Deland Pretty, MD   PT End of Session - 06/21/21 1016     Visit Number 4    Number of Visits 12    Date for PT Re-Evaluation 08/03/21    Authorization Type BCBS    Progress Note Due on Visit 10    PT Start Time 1016    PT Stop Time 0102    PT Time Calculation (min) 42 min             Past Medical History:  Diagnosis Date   Abnormal stress test    a. 09/2008, ST depression on ETT @ [facility], no further w/u. // Cath 8/14: no CAD   Adenomatous colon polyp    Anxiety and depression    Aortic insufficiency    a. 06/2012 Echo: EF 55-60%, Gr 1 DD, Mild AI, Triv MR, mildly dil LA, nl RV fxn. // Echo 03/2019: EF 60-65, normal RVSF, mod AI, trace MR, trivial TR, Asc Aorta 38 mm, GLS -18.1%   Enlarged thoracic aorta ([place])    MRA 4/19: 4.1 cm // CT 09/2019: 4 cm // Chest CTA 5/22: Ascending aorta 3.8 cm    History of melanoma    Followed with Dr. Tonia Jones   HTN (hypertension)    Hyperlipidemia    Leukocytosis    Mild   Migraines    Osteopenia    Paroxysmal atrial fibrillation ([place])    admx in 09/2019 w/ choledocho lithiasis >> ERCP // several days later presented to ED with AF with RVR >> DCCV in ED unsuccessful; CHADS-VASc 4 >> Apixaban started for at least 1 month - ?? long term Rx   Varicose veins    Past Surgical History:  Procedure Laterality Date   BTL     CHOLECYSTECTOMY     DILATION AND CURETTAGE OF UTERUS     ERCP N/A 09/17/2019   Procedure: ENDOSCOPIC RETROGRADE CHOLANGIOPANCREATOGRAPHY (ERCP);  Surgeon: Kristine Juniper, MD;  Location: [place];  Service: Gastroenterology;  Laterality: N/A;   FEMORAL HERNIA REPAIR     High endocervical polyp removed     IR EMBO VENOUS NOT HEMORR HEMANG  INC GUIDE ROADMAPPING  07/20/2020   LIPOMA EXCISION Right 03/23/2015   Procedure:  EXCISION LARGE LIPOMA RIGHT UPPER ARM  ;  Surgeon: Kristine Roys, MD;  Location: [place];  Service: Plastics;  Laterality: Right;   MELANOMA EXCISION  2003   right calf   OVARIAN CYST REMOVAL     REMOVAL OF STONES  09/17/2019   Procedure: REMOVAL OF STONES;  Surgeon: Kristine Juniper, MD;  Location: [facility] ENDOSCOPY;  Service: Gastroenterology;;   Kristine Jones  09/17/2019   Procedure: SPHINCTEROTOMY;  Surgeon: Kristine Juniper, MD;  Location: [facility] ENDOSCOPY;  Service: Gastroenterology;;   Patient Active Problem List   Diagnosis Date Noted   Atrial fibrillation with RVR ([place])    Anxiety and depression    Choledocholithiasis    Elevated liver enzymes    Hypotension    Osteopenia    Leukocytopenia 06/23/2014   HYPERLIPIDEMIA-MIXED 10/12/2009   HYPERTENSION, BENIGN 10/12/2009   AORTIC INSUFFICIENCY 10/12/2009   ANXIETY DEPRESSION 10/08/2009   GERD 10/08/2009   PMS 10/08/2009   Disorder of bone and cartilage 10/08/2009   COLONIC POLYPS, ADENOMATOUS, HX OF 10/08/2009   MIGRAINES, HX OF 10/08/2009  PCP: Kristine Pretty, MD  REFERRING PROVIDER: Gregor Hams, MD   REFERRING DIAG: [phone] (ICD-10-CM) - Left hip pain   THERAPY DIAG:  Pain in left hip   Muscle weakness (generalized)   ONSET DATE: 05/20/21 no MOI   SUBJECTIVE:    SUBJECTIVE STATEMENT:  States that she woke up with left hip pain and has progressively improved over time. States that this morning her right hip started to bother her. Started out as a pinching kind of pain. States she used to go to [employer] but stopped recently as her cat was not doing well and recently passed away.  2021/07/18 States that after she left here she walked the dog in the park and then seh went to dance class she could barely walk. States that she called here as she was having intense right knee pain and it was throbbing and burning and she used ice and elevated her knee and it is a little better now. States that she has had cortisone  injections with last one in December but usually she can go months without needing her next one. States she has had about 5 injections in her knee. She has tried PT, and it helped a little bit.    PERTINENT HISTORY: anixeity, migraines, pseudogout,  HTN, leaky valve, arrhythmia, right knee OA and ROM limitations   PAIN:  Are you having pain? 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     COGNITION:          Overall cognitive status: Within functional limits for tasks assessed              Observation:           Swelling at knee joint                    Left 39.0 cm Right 40.25cm     MUSCLE LENGTH: Hamstrings: Right 30 deg; Left 20 deg     PALPATION: tenderness to palpation in left anterior hip    LE Measurements:       Lower Extremity Right 06/08/2021 Left 06/08/2021    A/PROM MMT A/PROM MMT  Hip Flexion 100** 4+ 110 4  Hip Extension   4   4  Hip Abduction          Hip Adduction          Hip Internal rotation 30**   45    Hip External rotation 45**   55    Knee Flexion 126 4+ 140 4+  Knee Extension 8 (lacking) 4* 0 4+  Ankle Dorsiflexion          Ankle Plantarflexion          Ankle Inversion          Ankle Eversion           (Blank rows = not tested) * = pain ** = guarding       GAIT:   Comments: reduced right TKE, antalgic gait, limited hip mobility       TODAY'S TREATMENT: 07-18-21 Therapeutic  Exercise: Seated: knee ROM - 7 minutes flexion and extension Right, LAQs  x15 5" holds R  Aerobic:  started rocking then progressed to full revolutions 10 minutes  06/08/21 Therapeutic Exercise:           Aerobic: Supine: bridge 2x10 5" holds, heel slide x10 R 5" holds Prone:           Seated:heel slide x10 R 5" holds           Standing: hip flexor stretch x10 10" holds L     PATIENT EDUCATION:  Education details:on use of of ice vs heat on knee ROM, and synovial fluid on different types of knee injections, on current presentation, on dance movements and placing dance on hold. Person educated: Patient Education method: Explanation, Demonstration, and Handouts Education comprehension: verbalized understanding       HOME EXERCISE PROGRAM: [id number]   ASSESSMENT:   CLINICAL IMPRESSION: Patient is a 71 y.o. female who was seen today for physical therapy evaluation and treatment for left hip pain. Objective impairments include Abnormal gait, decreased activity tolerance, decreased endurance, difficulty walking, decreased ROM, decreased strength, increased edema, and pain. These impairments are limiting patient from cleaning, community activity, and yard work. Personal factors including Age and Fitness are also affecting patient's functional outcome. Patient will benefit from skilled PT to address above impairments and improve overall function.  06/21/2021 Session focused on education in regards to right knee. Educated pain on different type of knee injections, importance of knee mobility in regards to knee pain and current presentation. Discussed following up with MD about right knee pan. Trailed heel lift in right shoe and this improved gait mechanics but patient uncomfortable/uncertain, instructed patient to try using heel lift in shoe as an exercise but not to wear it for long periods of time. Discussed how show lift is short term solution for right hip pain but not to improve knee pain. Will continue with current POC as tolerated.    REHAB POTENTIAL: Good   CLINICAL DECISION MAKING:  Stable/uncomplicated   EVALUATION COMPLEXITY: Low     GOALS: Goals reviewed with patient? Yes   SHORT TERM GOALS:   STG Name Target Date Goal status  1 Patient will be independent in self management strategies to improve quality of life and functional outcomes. Baseline:  07/06/2021  INITIAL  2 Patient will report at least 50% improvement in overall symptoms and/or function to demonstrate improved functional mobility Baseline: 0% 07/06/2021  INITIAL  3 Patient will be able to demonstrate at least 5-130 degrees of right knee ROM Baseline: 07/06/2021  INITIAL  4   Baseline:      5   Baseline:      6   Baseline:      7   Baseline:        LONG TERM GOALS:    LTG Name Target Date Goal status  1 Patient will report at least 75% improvement in overall symptoms and/or function to demonstrate improved functional mobility Baseline:0% 08/03/2021  INITIAL  2 Patient will demonstrate reduced swelling in knee by having measurements within  cm of each other Baseline: 08/03/2021  INITIAL  3 Patient will report returning to [facility] for return to PLOF Baseline: 08/03/2021  INITIAL  4   Baseline:      5   Baseline:      6   Baseline:      7   Baseline:  PLAN: PT FREQUENCY: 1-2x/week for total of 12 visits   PT DURATION: 8 weeks   PLANNED INTERVENTIONS: Therapeutic exercises, Therapeutic activity, Neuro Muscular re-education, Balance training, Gait training, Patient/Family education, Joint mobilization, Aquatic Therapy, Dry Needling, Cryotherapy, Moist heat, Ionotophoresis 4mg /ml Dexamethasone, and Manual therapy   PLAN FOR NEXT SESSION: right knee mobility, hip strengthening, hip flexor stretching, trial heel lift under right leg   11:45 AM, 06/21/21 Jerene Pitch, DPT Physical Therapy with [facility]  [phone] office

## 2021-06-23 ENCOUNTER — Encounter: Payer: Medicare Other | Admitting: Physical Therapy

## 2021-06-27 ENCOUNTER — Other Ambulatory Visit: Payer: Self-pay

## 2021-06-27 ENCOUNTER — Ambulatory Visit: Payer: Medicare Other | Admitting: Physical Therapy

## 2021-06-27 ENCOUNTER — Encounter: Payer: Self-pay | Admitting: Physical Therapy

## 2021-06-27 DIAGNOSIS — M6281 Muscle weakness (generalized): Secondary | ICD-10-CM | POA: Diagnosis not present

## 2021-06-27 DIAGNOSIS — M25552 Pain in left hip: Secondary | ICD-10-CM | POA: Diagnosis not present

## 2021-06-27 NOTE — Therapy (Signed)
OUTPATIENT PHYSICAL THERAPY TREATMENT NOTE   Patient Name: Kristine Jones MRN: [id number] DOB:November 20, 1950, 71 y.o., female Today's Date: 06/27/2021  PCP: Deland Pretty, MD REFERRING PROVIDER: Deland Pretty, MD   PT End of Session - 06/27/21 1104     Visit Number 5    Number of Visits 12    Date for PT Re-Evaluation 08/03/21    Authorization Type BCBS    Progress Note Due on Visit 10    PT Start Time 1105    PT Stop Time 1145    PT Time Calculation (min) 40 min             Past Medical History:  Diagnosis Date   Abnormal stress test    a. 09/2008, ST depression on ETT @ [facility], no further w/u. // Cath 8/14: no CAD   Adenomatous colon polyp    Anxiety and depression    Aortic insufficiency    a. 06/2012 Echo: EF 55-60%, Gr 1 DD, Mild AI, Triv MR, mildly dil LA, nl RV fxn. // Echo 03/2019: EF 60-65, normal RVSF, mod AI, trace MR, trivial TR, Asc Aorta 38 mm, GLS -18.1%   Enlarged thoracic aorta ([place])    MRA 4/19: 4.1 cm // CT 09/2019: 4 cm // Chest CTA 5/22: Ascending aorta 3.8 cm    History of melanoma    Followed with Dr. Tonia Brooms   HTN (hypertension)    Hyperlipidemia    Leukocytosis    Mild   Migraines    Osteopenia    Paroxysmal atrial fibrillation ([place])    admx in 09/2019 w/ choledocho lithiasis >> ERCP // several days later presented to ED with AF with RVR >> DCCV in ED unsuccessful; CHADS-VASc 4 >> Apixaban started for at least 1 month - ?? long term Rx   Varicose veins    Past Surgical History:  Procedure Laterality Date   BTL     CHOLECYSTECTOMY     DILATION AND CURETTAGE OF UTERUS     ERCP N/A 09/17/2019   Procedure: ENDOSCOPIC RETROGRADE CHOLANGIOPANCREATOGRAPHY (ERCP);  Surgeon: Ronnette Juniper, MD;  Location: [place];  Service: Gastroenterology;  Laterality: N/A;   FEMORAL HERNIA REPAIR     High endocervical polyp removed     IR EMBO VENOUS NOT HEMORR HEMANG  INC GUIDE ROADMAPPING  07/20/2020   LIPOMA EXCISION Right 03/23/2015   Procedure:  EXCISION LARGE LIPOMA RIGHT UPPER ARM  ;  Surgeon: Youlanda Roys, MD;  Location: [place];  Service: Plastics;  Laterality: Right;   MELANOMA EXCISION  2003   right calf   OVARIAN CYST REMOVAL     REMOVAL OF STONES  09/17/2019   Procedure: REMOVAL OF STONES;  Surgeon: Ronnette Juniper, MD;  Location: [facility] ENDOSCOPY;  Service: Gastroenterology;;   [name]  09/17/2019   Procedure: SPHINCTEROTOMY;  Surgeon: Ronnette Juniper, MD;  Location: [facility] ENDOSCOPY;  Service: Gastroenterology;;   Patient Active Problem List   Diagnosis Date Noted   Atrial fibrillation with RVR ([place])    Anxiety and depression    Choledocholithiasis    Elevated liver enzymes    Hypotension    Osteopenia    Leukocytopenia 06/23/2014   HYPERLIPIDEMIA-MIXED 10/12/2009   HYPERTENSION, BENIGN 10/12/2009   AORTIC INSUFFICIENCY 10/12/2009   ANXIETY DEPRESSION 10/08/2009   GERD 10/08/2009   PMS 10/08/2009   Disorder of bone and cartilage 10/08/2009   COLONIC POLYPS, ADENOMATOUS, HX OF 10/08/2009   MIGRAINES, HX OF 10/08/2009  PCP: Deland Pretty, MD  REFERRING PROVIDER: Gregor Hams, MD   REFERRING DIAG: [phone] (ICD-10-CM) - Left hip pain   THERAPY DIAG:  Pain in left hip   Muscle weakness (generalized)   ONSET DATE: 05/20/21 no MOI   SUBJECTIVE:    SUBJECTIVE STATEMENT:  States that she woke up with left hip pain and has progressively improved over time. States that this morning her right hip started to bother her. Started out as a pinching kind of pain. States she used to go to [employer] but stopped recently as her cat was not doing well and recently passed away.  2021/06/28 States that she tried the insert and it felt awkward. Reports that her hip feels good and that her knee feels good and has been riding the bike.    PERTINENT HISTORY: anixeity, migraines, pseudogout,  HTN, leaky valve, arrhythmia, right knee OA and ROM limitations   PAIN:  Are you having pain? 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     COGNITION:          Overall cognitive status: Within functional limits for tasks assessed              Observation:           Swelling at knee joint                    Left 39.0 cm Right 40.25cm     MUSCLE LENGTH: Hamstrings: Right 30 deg; Left 20 deg     PALPATION: tenderness to palpation in left anterior hip    LE Measurements:       Lower Extremity Right 06/08/2021 Left 06/08/2021    A/PROM MMT A/PROM MMT  Hip Flexion 100** 4+ 110 4  Hip Extension   4   4  Hip Abduction          Hip Adduction          Hip Internal rotation 30**   45    Hip External rotation 45**   55    Knee Flexion 126 4+ 140 4+  Knee Extension 8 (lacking) 4* 0 4+  Ankle Dorsiflexion          Ankle Plantarflexion          Ankle Inversion          Ankle Eversion           (Blank rows = not tested) * = pain ** = guarding       GAIT:  Comments: reduced right TKE, antalgic gait, limited hip mobility       TODAY'S TREATMENT: 28-Jun-2021 Therapeutic Exercise: Seated: LAQs  2x15 5" holds R, hamstring stretch x3 30" holds B Standing: knee bends (superset with mini squats) 4x10 R, mini squats at counter with verbal and tactile cues 4x5 - pressing into the floor for glute activation Aerobic:  full revolutions 5 minutes Quadruped: rock backs - as tolerated 5 minutes for knee flexion - tolerated well   06/08/21 Therapeutic Exercise:  Aerobic: Supine: bridge 2x10 5" holds, heel slide x10 R 5" holds Prone:            Seated:heel slide x10 R 5" holds           Standing: hip flexor stretch x10 10" holds L     PATIENT EDUCATION:  Education details:on importance of knee mobility, knee mechanics, current presentation, low risk of injuring knee with current exercise regiment, on importance of daily movement in knee Person educated: Patient Education method: Explanation, Demonstration, and Handouts Education comprehension: verbalized understanding       HOME EXERCISE PROGRAM: [id number]   ASSESSMENT:   CLINICAL IMPRESSION: Patient is a 71 y.o. female who was seen today for physical therapy evaluation and treatment for left hip pain. Objective impairments include Abnormal gait, decreased activity tolerance, decreased endurance, difficulty walking, decreased ROM, decreased strength, increased edema, and pain. These impairments are limiting patient from cleaning, community activity, and yard work. Personal factors including Age and Fitness are also affecting patient's functional outcome. Patient will benefit from skilled PT to address above impairments and improve overall function.  06/27/2021 Patient very hesitant with all movements and required encouragement and education on rationale for all exercises. Discussed importance of daily ROM exercises in right knee and current knee motions available about the knee. Patient very guarded but tolerated exercises well with minor soreness noted afterwards. Educated patient to continue to perform bike daily and to ice as needed for pain relief.    REHAB POTENTIAL: Good   CLINICAL DECISION MAKING: Stable/uncomplicated   EVALUATION COMPLEXITY: Low     GOALS: Goals reviewed with patient? Yes   SHORT TERM GOALS:   STG Name Target Date Goal status  1 Patient will be independent in self management strategies to improve quality of life and functional outcomes. Baseline:  07/06/2021  INITIAL  2 Patient will report at least 50% improvement in overall symptoms and/or  function to demonstrate improved functional mobility Baseline: 0% 07/06/2021  INITIAL  3 Patient will be able to demonstrate at least 5-130 degrees of right knee ROM Baseline: 07/06/2021  INITIAL  4   Baseline:      5   Baseline:      6   Baseline:      7   Baseline:        LONG TERM GOALS:    LTG Name Target Date Goal status  1 Patient will report at least 75% improvement in overall symptoms and/or function to demonstrate improved functional mobility Baseline:0% 08/03/2021  INITIAL  2 Patient will demonstrate reduced swelling in knee by having measurements within  cm of each other Baseline: 08/03/2021  INITIAL  3 Patient will report returning to [facility] for return to PLOF Baseline: 08/03/2021  INITIAL  4   Baseline:      5   Baseline:      6   Baseline:      7   Baseline:            PLAN: PT FREQUENCY: 1-2x/week for total of 12 visits   PT DURATION: 8 weeks   PLANNED INTERVENTIONS: Therapeutic exercises, Therapeutic activity, Neuro Muscular re-education, Balance training, Gait training, Patient/Family education, Joint mobilization, Aquatic Therapy, Dry Needling, Cryotherapy, Moist heat, Ionotophoresis 4mg /ml Dexamethasone, and Manual therapy   PLAN FOR NEXT SESSION: right knee mobility, hip strengthening, hip flexor stretching, trial heel lift under right leg   12:28 PM, 06/27/21 Jerene Pitch, DPT Physical Therapy with [facility]  [phone] office

## 2021-06-30 ENCOUNTER — Encounter: Payer: Self-pay | Admitting: Physical Therapy

## 2021-06-30 ENCOUNTER — Other Ambulatory Visit: Payer: Self-pay

## 2021-06-30 ENCOUNTER — Ambulatory Visit: Payer: Medicare Other | Admitting: Physical Therapy

## 2021-06-30 DIAGNOSIS — M6281 Muscle weakness (generalized): Secondary | ICD-10-CM | POA: Diagnosis not present

## 2021-06-30 DIAGNOSIS — M25552 Pain in left hip: Secondary | ICD-10-CM | POA: Diagnosis not present

## 2021-06-30 NOTE — Therapy (Signed)
OUTPATIENT PHYSICAL THERAPY TREATMENT NOTE   Patient Name: Kristine Jones MRN: [id number] DOB:1951/03/01, 71 y.o., female Today's Date: 06/30/2021  PCP: Deland Pretty, MD REFERRING PROVIDER: Deland Pretty, MD   PT End of Session - 06/30/21 1102     Visit Number 6    Number of Visits 12    Date for PT Re-Evaluation 08/03/21    Authorization Type BCBS    Progress Note Due on Visit 10    PT Start Time 1102    PT Stop Time 6767    PT Time Calculation (min) 43 min             Past Medical History:  Diagnosis Date   Abnormal stress test    a. 09/2008, ST depression on ETT @ [facility], no further w/u. // Cath 8/14: no CAD   Adenomatous colon polyp    Anxiety and depression    Aortic insufficiency    a. 06/2012 Echo: EF 55-60%, Gr 1 DD, Mild AI, Triv MR, mildly dil LA, nl RV fxn. // Echo 03/2019: EF 60-65, normal RVSF, mod AI, trace MR, trivial TR, Asc Aorta 38 mm, GLS -18.1%   Enlarged thoracic aorta ([place])    MRA 4/19: 4.1 cm // CT 09/2019: 4 cm // Chest CTA 5/22: Ascending aorta 3.8 cm    History of melanoma    Followed with Dr. Tonia Brooms   HTN (hypertension)    Hyperlipidemia    Leukocytosis    Mild   Migraines    Osteopenia    Paroxysmal atrial fibrillation ([place])    admx in 09/2019 w/ choledocho lithiasis >> ERCP // several days later presented to ED with AF with RVR >> DCCV in ED unsuccessful; CHADS-VASc 4 >> Apixaban started for at least 1 month - ?? long term Rx   Varicose veins    Past Surgical History:  Procedure Laterality Date   BTL     CHOLECYSTECTOMY     DILATION AND CURETTAGE OF UTERUS     ERCP N/A 09/17/2019   Procedure: ENDOSCOPIC RETROGRADE CHOLANGIOPANCREATOGRAPHY (ERCP);  Surgeon: Ronnette Juniper, MD;  Location: [place];  Service: Gastroenterology;  Laterality: N/A;   FEMORAL HERNIA REPAIR     High endocervical polyp removed     IR EMBO VENOUS NOT HEMORR HEMANG  INC GUIDE ROADMAPPING  07/20/2020   LIPOMA EXCISION Right 03/23/2015   Procedure:  EXCISION LARGE LIPOMA RIGHT UPPER ARM  ;  Surgeon: Youlanda Roys, MD;  Location: [place];  Service: Plastics;  Laterality: Right;   MELANOMA EXCISION  2003   right calf   OVARIAN CYST REMOVAL     REMOVAL OF STONES  09/17/2019   Procedure: REMOVAL OF STONES;  Surgeon: Ronnette Juniper, MD;  Location: [facility] ENDOSCOPY;  Service: Gastroenterology;;   [name]  09/17/2019   Procedure: SPHINCTEROTOMY;  Surgeon: Ronnette Juniper, MD;  Location: [facility] ENDOSCOPY;  Service: Gastroenterology;;   Patient Active Problem List   Diagnosis Date Noted   Atrial fibrillation with RVR ([place])    Anxiety and depression    Choledocholithiasis    Elevated liver enzymes    Hypotension    Osteopenia    Leukocytopenia 06/23/2014   HYPERLIPIDEMIA-MIXED 10/12/2009   HYPERTENSION, BENIGN 10/12/2009   AORTIC INSUFFICIENCY 10/12/2009   ANXIETY DEPRESSION 10/08/2009   GERD 10/08/2009   PMS 10/08/2009   Disorder of bone and cartilage 10/08/2009   COLONIC POLYPS, ADENOMATOUS, HX OF 10/08/2009   MIGRAINES, HX OF 10/08/2009  PCP: Deland Pretty, MD  REFERRING PROVIDER: Gregor Hams, MD   REFERRING DIAG: [phone] (ICD-10-CM) - Left hip pain   THERAPY DIAG:  Pain in left hip   Muscle weakness (generalized)   ONSET DATE: 05/20/21 no MOI   SUBJECTIVE:    SUBJECTIVE STATEMENT:  States that she woke up with left hip pain and has progressively improved over time. States that this morning her right hip started to bother her. Started out as a pinching kind of pain. States she used to go to [employer] but stopped recently as her cat was not doing well and recently passed away.  07/12/2021 States that her right knee is sore but she was on the floor performing some carpet cleaning.    PERTINENT HISTORY: anixeity, migraines, pseudogout,  HTN, leaky valve, arrhythmia, right knee OA and ROM limitations   PAIN:  Are you having pain? 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     COGNITION:          Overall cognitive status: Within functional limits for tasks assessed              Observation:           Swelling at knee joint                    Left 39.0 cm Right 40.25cm     MUSCLE LENGTH: Hamstrings: Right 30 deg; Left 20 deg     PALPATION: tenderness to palpation in left anterior hip    LE Measurements:       Lower Extremity Right 06/08/2021 Left 06/08/2021    A/PROM MMT A/PROM MMT  Hip Flexion 100** 4+ 110 4  Hip Extension   4   4  Hip Abduction          Hip Adduction          Hip Internal rotation 30**   45    Hip External rotation 45**   55    Knee Flexion 126 4+ 140 4+  Knee Extension 8 (lacking) 4* 0 4+  Ankle Dorsiflexion          Ankle Plantarflexion          Ankle Inversion          Ankle Eversion           (Blank rows = not tested) * = pain ** = guarding       GAIT: Comments: reduced right TKE, antalgic gait, limited hip mobility       TODAY'S TREATMENT: 12-Jul-2021 Therapeutic Exercise: Seated: LAQs  2x15 5" holds R, self massage with tiger tail to right LE 6 minutes total Standing: heel raise holds 3x5 5" holds, tandem x1 30" holds, tandem with head rotation 2x5 bilateral, 6" fwd steps 4x5 bilat one UE assist, steps alternating 5 minutes practice   06/08/21 Therapeutic Exercise:           Aerobic: Supine: bridge 2x10 5" holds, heel slide x10 R 5" holds Prone:           Seated:heel slide x10  R 5" holds           Standing: hip flexor stretch x10 10" holds L     PATIENT EDUCATION:  Education:on HEP, on  rationale for exercises, on anatomy and mechanics and more glute active activiiteies Person educated: Patient Education method: Explanation, Demonstration, and Handouts Education comprehension: verbalized understanding       HOME EXERCISE PROGRAM: [id number]   ASSESSMENT:   CLINICAL IMPRESSION: Patient is a 71 y.o. female who was seen today for physical therapy evaluation and treatment for left hip pain. Objective impairments include Abnormal gait, decreased activity tolerance, decreased endurance, difficulty walking, decreased ROM, decreased strength, increased edema, and pain. These impairments are limiting patient from cleaning, community activity, and yard work. Personal factors including Age and Fitness are also affecting patient's functional outcome. Patient will benefit from skilled PT to address above impairments and improve overall function.  06/30/2021 Session focused on weightbearing exercises and more functional activities. Educated patient in more active movements with hip/glute activation versus more passive movements focusing on momentum. Engaging and using glutes was difficult for pain but not painful. Instructed patient to pay attention to her movements throughout the day especially with stairs. Will continue with current POC.   REHAB POTENTIAL: Good   CLINICAL DECISION MAKING: Stable/uncomplicated   EVALUATION COMPLEXITY: Low     GOALS: Goals reviewed with patient? Yes   SHORT TERM GOALS:   STG Name Target Date Goal status  1 Patient will be independent in self management strategies to improve quality of life and functional outcomes. Baseline:  07/06/2021  INITIAL  2 Patient will report at least 50% improvement in overall symptoms and/or function to demonstrate improved functional mobility Baseline: 0% 07/06/2021  INITIAL  3 Patient will be able to demonstrate at least 5-130 degrees of right knee ROM Baseline: 07/06/2021  INITIAL  4   Baseline:      5   Baseline:       6   Baseline:      7   Baseline:        LONG TERM GOALS:    LTG Name Target Date Goal status  1 Patient will report at least 75% improvement in overall symptoms and/or function to demonstrate improved functional mobility Baseline:0% 08/03/2021  INITIAL  2 Patient will demonstrate reduced swelling in knee by having measurements within  cm of each other Baseline: 08/03/2021  INITIAL  3 Patient will report returning to [facility] for return to PLOF Baseline: 08/03/2021  INITIAL  4   Baseline:      5   Baseline:      6   Baseline:      7   Baseline:            PLAN: PT FREQUENCY: 1-2x/week for total of 12 visits   PT DURATION: 8 weeks   PLANNED INTERVENTIONS: Therapeutic exercises, Therapeutic activity, Neuro Muscular re-education, Balance training, Gait training, Patient/Family education, Joint mobilization, Aquatic Therapy, Dry Needling, Cryotherapy, Moist heat, Ionotophoresis 4mg /ml Dexamethasone, and Manual therapy   PLAN FOR NEXT SESSION: right knee mobility, hip strengthening, hip flexor stretching, trial heel lift under right leg   12:10 PM, 06/30/21 Jerene Pitch, DPT Physical Therapy with [facility]  [phone] office

## 2021-07-04 ENCOUNTER — Other Ambulatory Visit: Payer: Self-pay

## 2021-07-04 ENCOUNTER — Encounter: Payer: Self-pay | Admitting: Physical Therapy

## 2021-07-04 ENCOUNTER — Ambulatory Visit: Payer: Medicare Other | Admitting: Physical Therapy

## 2021-07-04 DIAGNOSIS — M6281 Muscle weakness (generalized): Secondary | ICD-10-CM

## 2021-07-04 DIAGNOSIS — M25552 Pain in left hip: Secondary | ICD-10-CM

## 2021-07-04 NOTE — Therapy (Signed)
OUTPATIENT PHYSICAL THERAPY TREATMENT NOTE   Patient Name: Kristine MAIOLO MRN: [id number] DOB:1951-05-15, 71 y.o., female Today's Date: 07/04/2021  PCP: Deland Pretty, MD REFERRING PROVIDER: Deland Pretty, MD   PT End of Session - 07/04/21 1106     Visit Number 7    Number of Visits 12    Date for PT Re-Evaluation 08/03/21    Authorization Type BCBS    Progress Note Due on Visit 10    PT Start Time 1107    PT Stop Time 1147    PT Time Calculation (min) 40 min             Past Medical History:  Diagnosis Date   Abnormal stress test    a. 09/2008, ST depression on ETT @ [facility], no further w/u. // Cath 8/14: no CAD   Adenomatous colon polyp    Anxiety and depression    Aortic insufficiency    a. 06/2012 Echo: EF 55-60%, Gr 1 DD, Mild AI, Triv MR, mildly dil LA, nl RV fxn. // Echo 03/2019: EF 60-65, normal RVSF, mod AI, trace MR, trivial TR, Asc Aorta 38 mm, GLS -18.1%   Enlarged thoracic aorta ([place])    MRA 4/19: 4.1 cm // CT 09/2019: 4 cm // Chest CTA 5/22: Ascending aorta 3.8 cm    History of melanoma    Followed with Dr. Tonia Brooms   HTN (hypertension)    Hyperlipidemia    Leukocytosis    Mild   Migraines    Osteopenia    Paroxysmal atrial fibrillation ([place])    admx in 09/2019 w/ choledocho lithiasis >> ERCP // several days later presented to ED with AF with RVR >> DCCV in ED unsuccessful; CHADS-VASc 4 >> Apixaban started for at least 1 month - ?? long term Rx   Varicose veins    Past Surgical History:  Procedure Laterality Date   BTL     CHOLECYSTECTOMY     DILATION AND CURETTAGE OF UTERUS     ERCP N/A 09/17/2019   Procedure: ENDOSCOPIC RETROGRADE CHOLANGIOPANCREATOGRAPHY (ERCP);  Surgeon: Ronnette Juniper, MD;  Location: [place];  Service: Gastroenterology;  Laterality: N/A;   FEMORAL HERNIA REPAIR     High endocervical polyp removed     IR EMBO VENOUS NOT HEMORR HEMANG  INC GUIDE ROADMAPPING  07/20/2020   LIPOMA EXCISION Right 03/23/2015   Procedure:  EXCISION LARGE LIPOMA RIGHT UPPER ARM  ;  Surgeon: Youlanda Roys, MD;  Location: [place];  Service: Plastics;  Laterality: Right;   MELANOMA EXCISION  2003   right calf   OVARIAN CYST REMOVAL     REMOVAL OF STONES  09/17/2019   Procedure: REMOVAL OF STONES;  Surgeon: Ronnette Juniper, MD;  Location: [facility] ENDOSCOPY;  Service: Gastroenterology;;   [name]  09/17/2019   Procedure: SPHINCTEROTOMY;  Surgeon: Ronnette Juniper, MD;  Location: [facility] ENDOSCOPY;  Service: Gastroenterology;;   Patient Active Problem List   Diagnosis Date Noted   Atrial fibrillation with RVR ([place])    Anxiety and depression    Choledocholithiasis    Elevated liver enzymes    Hypotension    Osteopenia    Leukocytopenia 06/23/2014   HYPERLIPIDEMIA-MIXED 10/12/2009   HYPERTENSION, BENIGN 10/12/2009   AORTIC INSUFFICIENCY 10/12/2009   ANXIETY DEPRESSION 10/08/2009   GERD 10/08/2009   PMS 10/08/2009   Disorder of bone and cartilage 10/08/2009   COLONIC POLYPS, ADENOMATOUS, HX OF 10/08/2009   MIGRAINES, HX OF 10/08/2009  PCP: Deland Pretty, MD  REFERRING PROVIDER: Gregor Hams, MD   REFERRING DIAG: [phone] (ICD-10-CM) - Left hip pain   THERAPY DIAG:  Pain in left hip   Muscle weakness (generalized)   ONSET DATE: 05/20/21 no MOI   SUBJECTIVE:    SUBJECTIVE STATEMENT: State that she was walking a lot after last session. States that she had severe burning pain 8/10 along the right knee that night and into the next day. States she took things easy and then it felt better by Saturday.   07/04/2021 States that her right knee is sore but she was on the floor performing some carpet cleaning.    PERTINENT HISTORY: anixeity, migraines, pseudogout,  HTN, leaky valve, arrhythmia, right knee OA and ROM limitations   PAIN:  Are you having pain? 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     COGNITION:          Overall cognitive status: Within functional limits for tasks assessed              Observation:           Swelling at knee joint                    Left 39.0 cm Right 40.25cm     MUSCLE LENGTH: Hamstrings: Right 30 deg; Left 20 deg     PALPATION: tenderness to palpation in left anterior hip    LE Measurements:       Lower Extremity Right 06/08/2021 Left 06/08/2021    A/PROM MMT A/PROM MMT  Hip Flexion 100** 4+ 110 4  Hip Extension   4   4  Hip Abduction          Hip Adduction          Hip Internal rotation 30**   45    Hip External rotation 45**   55    Knee Flexion 126 4+ 140 4+  Knee Extension 8 (lacking) 4* 0 4+  Ankle Dorsiflexion          Ankle Plantarflexion          Ankle Inversion          Ankle Eversion           (Blank rows = not tested) * = pain ** = guarding       GAIT: Comments: reduced right TKE, antalgic gait, limited hip mobility       TODAY'S TREATMENT: 07/04/2021 Therapeutic Exercise: Seated: hamstring stretch x3 30" holds, piriformis stretch x3 30" holds bilat Aerobic: 6 minutes on bike while educating on current condition. Supine: bent knee fallouts 2 minutes total, bent knee fall outs 2 minutes total, windshield wipers 2 minutes    06/08/21 Therapeutic Exercise:           Aerobic: Supine: bridge 2x10 5" holds, heel slide x10 R 5" holds Prone:           Seated:heel slide x10 R 5" holds           Standing: hip flexor stretch x10 10" holds L  PATIENT EDUCATION:  Education:on DOMS on predicted muscle soreness and how this can be painful with muscle/motor dysfunction. On muscle  loading, movements and what to do after loading a muscle Person educated: Patient Education method: Explanation, Demonstration, and Handouts Education comprehension: verbalized understanding       HOME EXERCISE PROGRAM: [id number]   ASSESSMENT:   CLINICAL IMPRESSION: Patient is a 71 y.o. female who was seen today for physical therapy evaluation and treatment for left hip pain. Objective impairments include Abnormal gait, decreased activity tolerance, decreased endurance, difficulty walking, decreased ROM, decreased strength, increased edema, and pain. These impairments are limiting patient from cleaning, community activity, and yard work. Personal factors including Age and Fitness are also affecting patient's functional outcome. Patient will benefit from skilled PT to address above impairments and improve overall function.  07/04/2021 Session focused on education as patient concerned about pain that presented the night and following day after her last session. Focused on indirect exercises of the right knee and patient reported reduced tension and improved mobility afterwards. Added all new exercises to HEP. Will return to quad strengthening next session as tolerated.   REHAB POTENTIAL: Good   CLINICAL DECISION MAKING: Stable/uncomplicated   EVALUATION COMPLEXITY: Low     GOALS: Goals reviewed with patient? Yes   SHORT TERM GOALS:   STG Name Target Date Goal status  1 Patient will be independent in self management strategies to improve quality of life and functional outcomes. Baseline:  07/06/2021  INITIAL  2 Patient will report at least 50% improvement in overall symptoms and/or function to demonstrate improved functional mobility Baseline: 0% 07/06/2021  INITIAL  3 Patient will be able to demonstrate at least 5-130 degrees of right knee ROM Baseline: 07/06/2021  INITIAL  4   Baseline:      5   Baseline:      6   Baseline:      7   Baseline:        LONG TERM GOALS:    LTG  Name Target Date Goal status  1 Patient will report at least 75% improvement in overall symptoms and/or function to demonstrate improved functional mobility Baseline:0% 08/03/2021  INITIAL  2 Patient will demonstrate reduced swelling in knee by having measurements within  cm of each other Baseline: 08/03/2021  INITIAL  3 Patient will report returning to [facility] for return to PLOF Baseline: 08/03/2021  INITIAL  4   Baseline:      5   Baseline:      6   Baseline:      7   Baseline:            PLAN: PT FREQUENCY: 1-2x/week for total of 12 visits   PT DURATION: 8 weeks   PLANNED INTERVENTIONS: Therapeutic exercises, Therapeutic activity, Neuro Muscular re-education, Balance training, Gait training, Patient/Family education, Joint mobilization, Aquatic Therapy, Dry Needling, Cryotherapy, Moist heat, Ionotophoresis 4mg /ml Dexamethasone, and Manual therapy   PLAN FOR NEXT SESSION: hip strengthening, quad strengthening, hip and knee mobility    12:00 PM, 07/04/21 Jerene Pitch, DPT Physical Therapy with [facility]  [phone] office

## 2021-07-05 ENCOUNTER — Telehealth: Payer: Self-pay | Admitting: Internal Medicine

## 2021-07-05 DIAGNOSIS — H31002 Unspecified chorioretinal scars, left eye: Secondary | ICD-10-CM | POA: Diagnosis not present

## 2021-07-05 DIAGNOSIS — H43813 Vitreous degeneration, bilateral: Secondary | ICD-10-CM | POA: Diagnosis not present

## 2021-07-05 DIAGNOSIS — D3131 Benign neoplasm of right choroid: Secondary | ICD-10-CM | POA: Diagnosis not present

## 2021-07-05 DIAGNOSIS — H33193 Other retinoschisis and retinal cysts, bilateral: Secondary | ICD-10-CM | POA: Diagnosis not present

## 2021-07-05 NOTE — Telephone Encounter (Signed)
Pt states she will reach her visit limit as of 07/12/21 for Pt. She is asking if we can resubmit to extend her visits or if she will need a new referral. Please advise

## 2021-07-07 ENCOUNTER — Ambulatory Visit: Payer: Medicare Other | Admitting: Physical Therapy

## 2021-07-07 ENCOUNTER — Encounter: Payer: Self-pay | Admitting: Physical Therapy

## 2021-07-07 ENCOUNTER — Other Ambulatory Visit: Payer: Self-pay

## 2021-07-07 DIAGNOSIS — M6281 Muscle weakness (generalized): Secondary | ICD-10-CM | POA: Diagnosis not present

## 2021-07-07 DIAGNOSIS — M25552 Pain in left hip: Secondary | ICD-10-CM | POA: Diagnosis not present

## 2021-07-07 NOTE — Telephone Encounter (Signed)
I have left patient vm.    She would need to reach out to ins company.

## 2021-07-07 NOTE — Therapy (Signed)
OUTPATIENT PHYSICAL THERAPY TREATMENT NOTE   Patient Name: Kristine Jones MRN: [id number] DOB:09-20-1950, 71 y.o., female Today's Date: 07/07/2021  PCP: Deland Pretty, MD REFERRING PROVIDER: Deland Pretty, MD   PT End of Session - 07/07/21 1105     Visit Number 8    Number of Visits 12    Date for PT Re-Evaluation 08/03/21    Authorization Type BCBS    Progress Note Due on Visit 10    PT Start Time 1105    PT Stop Time 1145    PT Time Calculation (min) 40 min             Past Medical History:  Diagnosis Date   Abnormal stress test    a. 09/2008, ST depression on ETT @ [facility], no further w/u. // Cath 8/14: no CAD   Adenomatous colon polyp    Anxiety and depression    Aortic insufficiency    a. 06/2012 Echo: EF 55-60%, Gr 1 DD, Mild AI, Triv MR, mildly dil LA, nl RV fxn. // Echo 03/2019: EF 60-65, normal RVSF, mod AI, trace MR, trivial TR, Asc Aorta 38 mm, GLS -18.1%   Enlarged thoracic aorta ([place])    MRA 4/19: 4.1 cm // CT 09/2019: 4 cm // Chest CTA 5/22: Ascending aorta 3.8 cm    History of melanoma    Followed with Dr. Tonia Brooms   HTN (hypertension)    Hyperlipidemia    Leukocytosis    Mild   Migraines    Osteopenia    Paroxysmal atrial fibrillation ([place])    admx in 09/2019 w/ choledocho lithiasis >> ERCP // several days later presented to ED with AF with RVR >> DCCV in ED unsuccessful; CHADS-VASc 4 >> Apixaban started for at least 1 month - ?? long term Rx   Varicose veins    Past Surgical History:  Procedure Laterality Date   BTL     CHOLECYSTECTOMY     DILATION AND CURETTAGE OF UTERUS     ERCP N/A 09/17/2019   Procedure: ENDOSCOPIC RETROGRADE CHOLANGIOPANCREATOGRAPHY (ERCP);  Surgeon: Ronnette Juniper, MD;  Location: [place];  Service: Gastroenterology;  Laterality: N/A;   FEMORAL HERNIA REPAIR     High endocervical polyp removed     IR EMBO VENOUS NOT HEMORR HEMANG  INC GUIDE ROADMAPPING  07/20/2020   LIPOMA EXCISION Right 03/23/2015   Procedure:  EXCISION LARGE LIPOMA RIGHT UPPER ARM  ;  Surgeon: Youlanda Roys, MD;  Location: [place];  Service: Plastics;  Laterality: Right;   MELANOMA EXCISION  2003   right calf   OVARIAN CYST REMOVAL     REMOVAL OF STONES  09/17/2019   Procedure: REMOVAL OF STONES;  Surgeon: Ronnette Juniper, MD;  Location[facility] ENDOSCOPY;  Service: Gastroenterology;;   [name]  09/17/2019   Procedure: SPHINCTEROTOMY;  Surgeon: Ronnette Juniper, MD;  Location: [facility]OSCOPY;  Service: Gastroenterology;;   Patient Active Problem List   Diagnosis Date Noted   Atrial fibrillation with RVR ([place])    Anxiety and depression    Choledocholithiasis    Elevated liver enzymes    Hypotension    Osteopenia    Leukocytopenia 06/23/2014   HYPERLIPIDEMIA-MIXED 10/12/2009   HYPERTENSION, BENIGN 10/12/2009   AORTIC INSUFFICIENCY 10/12/2009   ANXIETY DEPRESSION 10/08/2009   GERD 10/08/2009   PMS 10/08/2009   Disorder of bone and cartilage 10/08/2009   COLONIC POLYPS, ADENOMATOUS, HX OF 10/08/2009   MIGRAINES, HX OF 10/08/2009  PCP: Deland Pretty, MD  REFERRING PROVIDER: Gregor Hams, MD   REFERRING DIAG: [phone] (ICD-10-CM) - Left hip pain   THERAPY DIAG:  Pain in left hip   Muscle weakness (generalized)   ONSET DATE: 05/20/21 no MOI   SUBJECTIVE:    SUBJECTIVE STATEMENT: Eval: State that she was walking a lot after last session. States that she had severe burning pain 8/10 along the right knee that night and into the next day. States she took things easy and then it felt better by Saturday.   07/07/2021 States that she feels good, that she feels a little stiff. States that she went back to the gym but focused on upper body.   PERTINENT HISTORY: anixeity, migraines, pseudogout,  HTN, leaky valve, arrhythmia, right knee OA and ROM limitations   PAIN:  Are you having pain? 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     COGNITION:          Overall cognitive status: Within functional limits for tasks assessed              Observation:           Swelling at knee joint                    Left 39.0 cm Right 40.25cm     MUSCLE LENGTH: Hamstrings: Right 30 deg; Left 20 deg     PALPATION: tenderness to palpation in left anterior hip    LE Measurements:       Lower Extremity Right 06/08/2021 Left 06/08/2021    A/PROM MMT A/PROM MMT  Hip Flexion 100** 4+ 110 4  Hip Extension   4   4  Hip Abduction          Hip Adduction          Hip Internal rotation 30**   45    Hip External rotation 45**   55    Knee Flexion 126 4+ 140 4+  Knee Extension 8 (lacking) 4* 0 4+  Ankle Dorsiflexion          Ankle Plantarflexion          Ankle Inversion          Ankle Eversion           (Blank rows = not tested) * = pain ** = guarding       GAIT: Comments: reduced right TKE, antalgic gait, limited hip mobility       TODAY'S TREATMENT: 07/07/2021 Therapeutic Exercise: Seated: STS x20 elevated surface (25 inch height) slow with leg press focus, LAQs x25 Bilat for ROM; STS hands on thighs 17" height chair focus on knees pressing out - x20, self mobilization to right leg.    06/08/21 Therapeutic Exercise:           Aerobic: Supine: bridge 2x10 5" holds, heel slide x10 R 5" holds Prone:           Seated:heel slide x10 R 5" holds           Standing: hip flexor stretch x10  10" holds L     PATIENT EDUCATION:  Education:reinforced what to do with DOMS and predicted muscle soreness  Person educated: Patient Education method: Explanation,  Demonstration, and Handouts Education comprehension: verbalized understanding       HOME EXERCISE PROGRAM: [id number]   ASSESSMENT:   CLINICAL IMPRESSION: Patient is a 71 y.o. female who was seen today for physical therapy evaluation and treatment for left hip pain. Objective impairments include Abnormal gait, decreased activity tolerance, decreased endurance, difficulty walking, decreased ROM, decreased strength, increased edema, and pain. These impairments are limiting patient from cleaning, community activity, and yard work. Personal factors including Age and Fitness are also affecting patient's functional outcome. Patient will benefit from skilled PT to address above impairments and improve overall function.  07/07/2021 Session focused on education with transition movements, proper muscle activation and reducing valgus with sit to stand. Educated patient on predicted muscle soreness, what that means and reinforced what to do when this happens. Fatigue noted end of session but no pain. Will continue with current POC as tolerated.    REHAB POTENTIAL: Good   CLINICAL DECISION MAKING: Stable/uncomplicated   EVALUATION COMPLEXITY: Low     GOALS: Goals reviewed with patient? Yes   SHORT TERM GOALS:   STG Name Target Date Goal status  1 Patient will be independent in self management strategies to improve quality of life and functional outcomes. Baseline:  07/06/2021  INITIAL  2 Patient will report at least 50% improvement in overall symptoms and/or function to demonstrate improved functional mobility Baseline: 0% 07/06/2021  INITIAL  3 Patient will be able to demonstrate at least 5-130 degrees of right knee ROM Baseline: 07/06/2021  INITIAL  4   Baseline:      5   Baseline:      6   Baseline:      7   Baseline:        LONG TERM GOALS:    LTG Name Target Date Goal status  1 Patient will report at least 75% improvement in overall symptoms and/or function to demonstrate improved  functional mobility Baseline:0% 08/03/2021  INITIAL  2 Patient will demonstrate reduced swelling in knee by having measurements within  cm of each other Baseline: 08/03/2021  INITIAL  3 Patient will report returning to [facility] for return to PLOF Baseline: 08/03/2021  INITIAL  4   Baseline:      5   Baseline:      6   Baseline:      7   Baseline:            PLAN: PT FREQUENCY: 1-2x/week for total of 12 visits   PT DURATION: 8 weeks   PLANNED INTERVENTIONS: Therapeutic exercises, Therapeutic activity, Neuro Muscular re-education, Balance training, Gait training, Patient/Family education, Joint mobilization, Aquatic Therapy, Dry Needling, Cryotherapy, Moist heat, Ionotophoresis 4mg /ml Dexamethasone, and Manual therapy   PLAN FOR NEXT SESSION: hip strengthening, quad strengthening, hip and knee mobility    11:51 AM, 07/07/21 Jerene Pitch, DPT Physical Therapy with [facility]  [phone] office

## 2021-07-11 ENCOUNTER — Ambulatory Visit: Payer: Medicare Other | Admitting: Physical Therapy

## 2021-07-11 ENCOUNTER — Other Ambulatory Visit: Payer: Self-pay

## 2021-07-11 ENCOUNTER — Encounter: Payer: Self-pay | Admitting: Physical Therapy

## 2021-07-11 DIAGNOSIS — M6281 Muscle weakness (generalized): Secondary | ICD-10-CM | POA: Diagnosis not present

## 2021-07-11 DIAGNOSIS — M25552 Pain in left hip: Secondary | ICD-10-CM

## 2021-07-11 NOTE — Therapy (Signed)
OUTPATIENT PHYSICAL THERAPY TREATMENT NOTE   Patient Name: Kristine Jones MRN: [id number] DOB:1950/10/01, 71 y.o., female Today's Date: 07/11/2021  PCP: Deland Pretty, MD REFERRING PROVIDER: Deland Pretty, MD   PT End of Session - 07/11/21 1059     Visit Number 9    Number of Visits 12    Date for PT Re-Evaluation 08/03/21    Authorization Type BCBS    Progress Note Due on Visit 10    PT Start Time 1100    PT Stop Time 1140    PT Time Calculation (min) 40 min             Past Medical History:  Diagnosis Date   Abnormal stress test    a. 09/2008, ST depression on ETT @ [facility], no further w/u. // Cath 8/14: no CAD   Adenomatous colon polyp    Anxiety and depression    Aortic insufficiency    a. 06/2012 Echo: EF 55-60%, Gr 1 DD, Mild AI, Triv MR, mildly dil LA, nl RV fxn. // Echo 03/2019: EF 60-65, normal RVSF, mod AI, trace MR, trivial TR, Asc Aorta 38 mm, GLS -18.1%   Enlarged thoracic aorta ([place])    MRA 4/19: 4.1 cm // CT 09/2019: 4 cm // Chest CTA 5/22: Ascending aorta 3.8 cm    History of melanoma    Followed with Dr. Tonia Brooms   HTN (hypertension)    Hyperlipidemia    Leukocytosis    Mild   Migraines    Osteopenia    Paroxysmal atrial fibrillation ([place])    admx in 09/2019 w/ choledocho lithiasis >> ERCP // several days later presented to ED with AF with RVR >> DCCV in ED unsuccessful; CHADS-VASc 4 >> Apixaban started for at least 1 month - ?? long term Rx   Varicose veins    Past Surgical History:  Procedure Laterality Date   BTL     CHOLECYSTECTOMY     DILATION AND CURETTAGE OF UTERUS     ERCP N/A 09/17/2019   Procedure: ENDOSCOPIC RETROGRADE CHOLANGIOPANCREATOGRAPHY (ERCP);  Surgeon: Ronnette Juniper, MD;  Location: [place];  Service: Gastroenterology;  Laterality: N/A;   FEMORAL HERNIA REPAIR     High endocervical polyp removed     IR EMBO VENOUS NOT HEMORR HEMANG  INC GUIDE ROADMAPPING  07/20/2020   LIPOMA EXCISION Right 03/23/2015   Procedure:  EXCISION LARGE LIPOMA RIGHT UPPER ARM  ;  Surgeon: Youlanda Roys, MD;  Location: [place];  Service: Plastics;  Laterality: Right;   MELANOMA EXCISION  2003   right calf   OVARIAN CYST REMOVAL     REMOVAL OF STONES  09/17/2019   Procedure: REMOVAL OF STONES;  Surgeon: Ronnette Juniper, MD;  Location: [facility] ENDOSCOPY;  Service: Gastroenterology;;   [name]  09/17/2019   Procedure: SPHINCTEROTOMY;  Surgeon: Ronnette Juniper, MD;  Location: [facility] ENDOSCOPY;  Service: Gastroenterology;;   Patient Active Problem List   Diagnosis Date Noted   Atrial fibrillation with RVR ([place])    Anxiety and depression    Choledocholithiasis    Elevated liver enzymes    Hypotension    Osteopenia    Leukocytopenia 06/23/2014   HYPERLIPIDEMIA-MIXED 10/12/2009   HYPERTENSION, BENIGN 10/12/2009   AORTIC INSUFFICIENCY 10/12/2009   ANXIETY DEPRESSION 10/08/2009   GERD 10/08/2009   PMS 10/08/2009   Disorder of bone and cartilage 10/08/2009   COLONIC POLYPS, ADENOMATOUS, HX OF 10/08/2009   MIGRAINES, HX OF 10/08/2009  PCP: Deland Pretty, MD  REFERRING PROVIDER: Gregor Hams, MD   REFERRING DIAG: [phone] (ICD-10-CM) - Left hip pain   THERAPY DIAG:  Pain in left hip   Muscle weakness (generalized)   ONSET DATE: 05/20/21 no MOI   SUBJECTIVE:    SUBJECTIVE STATEMENT: Eval: State that she was walking a lot after last session. States that she had severe burning pain 8/10 along the right knee that night and into the next day. States she took things easy and then it felt better by Saturday.   07/11/2021 Reports no pain after last session just mild soreness. Stiffness today and pain after sitting on tall bar stool yesterday but stretches helped a little bit.    PERTINENT HISTORY: anixeity, migraines, pseudogout,  HTN, leaky valve, arrhythmia, right knee OA and ROM limitations   PAIN:  Are you having pain? 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     COGNITION:          Overall cognitive status: Within functional limits for tasks assessed              Observation:           Swelling at knee joint                    Left 39.0 cm Right 40.25cm     MUSCLE LENGTH: Hamstrings: Right 30 deg; Left 20 deg     PALPATION: tenderness to palpation in left anterior hip    LE Measurements:       Lower Extremity Right 06/08/2021 Left 06/08/2021    A/PROM MMT A/PROM MMT  Hip Flexion 100** 4+ 110 4  Hip Extension   4   4  Hip Abduction          Hip Adduction          Hip Internal rotation 30**   45    Hip External rotation 45**   55    Knee Flexion 126 4+ 140 4+  Knee Extension 8 (lacking) 4* 0 4+  Ankle Dorsiflexion          Ankle Plantarflexion          Ankle Inversion          Ankle Eversion           (Blank rows = not tested) * = pain ** = guarding       GAIT: Comments: reduced right TKE, antalgic gait, limited hip mobility       TODAY'S TREATMENT: 07/11/2021 Therapeutic Exercise: Aerobic: bike 5 minutes level 1-2 resistance changed in middle Seated: hamstring stretch x3 30" holds R, LAQs x15 R, self mobilization with tiger tail 6 minutes  Standing: semi curtsi lunge 3x8 bilat with tactile cues Manual: Traction to right knee - felt good - 6 minutes  06/08/21 Therapeutic Exercise:           Aerobic: Supine: bridge 2x10 5" holds, heel slide x10 R 5" holds Prone:           Seated:heel slide x10 R 5" holds  Standing: hip flexor stretch x10 10" holds L  STS x20 elevated surface (25 inch height) slow with leg  press focus, LAQs x25 Bilat for ROM; STS hands on thighs 17" height chair focus on knees pressing out - x20, self mobilization to right leg.    PATIENT EDUCATION:  Education:on swelling present, on compression garments and butler to assist with donning compression socks  Person educated: Patient Education method: Explanation, Demonstration, and Handouts Education comprehension: verbalized understanding       HOME EXERCISE PROGRAM: [id number]   ASSESSMENT:   CLINICAL IMPRESSION: Patient is a 71 y.o. female who was seen today for physical therapy evaluation and treatment for left hip pain. Objective impairments include Abnormal gait, decreased activity tolerance, decreased endurance, difficulty walking, decreased ROM, decreased strength, increased edema, and pain. These impairments are limiting patient from cleaning, community activity, and yard work. Personal factors including Age and Fitness are also affecting patient's functional outcome. Patient will benefit from skilled PT to address above impairments and improve overall function.  07/11/2021 Swelling in right leg noted. Educated on use of compression garments and butler. Trailed traction of knee which felt good. Progressed standing hip strength without pain or difficulties. Minor soreness in knee noted end of session   REHAB POTENTIAL: Good   CLINICAL DECISION MAKING: Stable/uncomplicated   EVALUATION COMPLEXITY: Low     GOALS: Goals reviewed with patient? Yes   SHORT TERM GOALS:   STG Name Target Date Goal status  1 Patient will be independent in self management strategies to improve quality of life and functional outcomes. Baseline:  07/06/2021  INITIAL  2 Patient will report at least 50% improvement in overall symptoms and/or function to demonstrate improved functional mobility Baseline: 0% 07/06/2021  INITIAL  3 Patient will be able to demonstrate at least 5-130 degrees of right knee ROM Baseline: 07/06/2021  INITIAL  4    Baseline:      5   Baseline:      6   Baseline:      7   Baseline:        LONG TERM GOALS:    LTG Name Target Date Goal status  1 Patient will report at least 75% improvement in overall symptoms and/or function to demonstrate improved functional mobility Baseline:0% 08/03/2021  INITIAL  2 Patient will demonstrate reduced swelling in knee by having measurements within  cm of each other Baseline: 08/03/2021  INITIAL  3 Patient will report returning to [facility] for return to PLOF Baseline: 08/03/2021  INITIAL  4   Baseline:      5   Baseline:      6   Baseline:      7   Baseline:            PLAN: PT FREQUENCY: 1-2x/week for total of 12 visits   PT DURATION: 8 weeks   PLANNED INTERVENTIONS: Therapeutic exercises, Therapeutic activity, Neuro Muscular re-education, Balance training, Gait training, Patient/Family education, Joint mobilization, Aquatic Therapy, Dry Needling, Cryotherapy, Moist heat, Ionotophoresis 4mg /ml Dexamethasone, and Manual therapy   PLAN FOR NEXT SESSION: hip strengthening, quad strengthening, hip and knee mobility    11:48 AM, 07/11/21 Jerene Pitch, DPT Physical Therapy with [facility]  [phone] office

## 2021-07-12 ENCOUNTER — Ambulatory Visit: Payer: Medicare Other | Admitting: Family Medicine

## 2021-07-14 ENCOUNTER — Encounter: Payer: Medicare Other | Admitting: Physical Therapy

## 2021-07-15 DIAGNOSIS — E7801 Familial hypercholesterolemia: Secondary | ICD-10-CM | POA: Diagnosis not present

## 2021-07-15 DIAGNOSIS — I10 Essential (primary) hypertension: Secondary | ICD-10-CM | POA: Diagnosis not present

## 2021-07-15 DIAGNOSIS — E559 Vitamin D deficiency, unspecified: Secondary | ICD-10-CM | POA: Diagnosis not present

## 2021-07-15 DIAGNOSIS — M8580 Other specified disorders of bone density and structure, unspecified site: Secondary | ICD-10-CM | POA: Diagnosis not present

## 2021-07-18 ENCOUNTER — Encounter: Payer: Medicare Other | Admitting: Physical Therapy

## 2021-07-20 ENCOUNTER — Encounter: Payer: Self-pay | Admitting: Cardiovascular Disease

## 2021-07-20 DIAGNOSIS — I10 Essential (primary) hypertension: Secondary | ICD-10-CM

## 2021-07-21 ENCOUNTER — Ambulatory Visit: Payer: Medicare Other | Admitting: Physical Therapy

## 2021-07-21 ENCOUNTER — Encounter: Payer: Self-pay | Admitting: Physical Therapy

## 2021-07-21 DIAGNOSIS — M25552 Pain in left hip: Secondary | ICD-10-CM

## 2021-07-21 DIAGNOSIS — M6281 Muscle weakness (generalized): Secondary | ICD-10-CM | POA: Diagnosis not present

## 2021-07-21 NOTE — Therapy (Signed)
OUTPATIENT PHYSICAL THERAPY TREATMENT NOTE, Progress note, RECERT   Patient Name: Kristine Jones MRN: [id number] DOB:10-Nov-1950, 71 y.o., female Today's Date: 07/21/2021  Progress Note Reporting Period 06/08/21 to 07/21/21  See note below for Objective Data and Assessment of Progress/Goals.      PCP: Deland Pretty, MD REFERRING PROVIDER: Deland Pretty, MD   PT End of Session - 07/21/21 1100     Visit Number 10    Number of Visits 22    Date for PT Re-Evaluation 09/01/21    Authorization Type BCBS    Progress Note Due on Visit 20    PT Start Time 1100    PT Stop Time 1145    PT Time Calculation (min) 45 min             Past Medical History:  Diagnosis Date   Abnormal stress test    a. 09/2008, ST depression on ETT @ [facility], no further w/u. // Cath 8/14: no CAD   Adenomatous colon polyp    Anxiety and depression    Aortic insufficiency    a. 06/2012 Echo: EF 55-60%, Gr 1 DD, Mild AI, Triv MR, mildly dil LA, nl RV fxn. // Echo 03/2019: EF 60-65, normal RVSF, mod AI, trace MR, trivial TR, Asc Aorta 38 mm, GLS -18.1%   Enlarged thoracic aorta ([place])    MRA 4/19: 4.1 cm // CT 09/2019: 4 cm // Chest CTA 5/22: Ascending aorta 3.8 cm    History of melanoma    Followed with Dr. Tonia Brooms   HTN (hypertension)    Hyperlipidemia    Leukocytosis    Mild   Migraines    Osteopenia    Paroxysmal atrial fibrillation ([place])    admx in 09/2019 w/ choledocho lithiasis >> ERCP // several days later presented to ED with AF with RVR >> DCCV in ED unsuccessful; CHADS-VASc 4 >> Apixaban started for at least 1 month - ?? long term Rx   Varicose veins    Past Surgical History:  Procedure Laterality Date   BTL     CHOLECYSTECTOMY     DILATION AND CURETTAGE OF UTERUS     ERCP N/A 09/17/2019   Procedure: ENDOSCOPIC RETROGRADE CHOLANGIOPANCREATOGRAPHY (ERCP);  Surgeon: Ronnette Juniper, MD;  Location: [place];  Service: Gastroenterology;  Laterality: N/A;   FEMORAL HERNIA REPAIR      High endocervical polyp removed     IR EMBO VENOUS NOT HEMORR HEMANG  INC GUIDE ROADMAPPING  07/20/2020   LIPOMA EXCISION Right 03/23/2015   Procedure: EXCISION LARGE LIPOMA RIGHT UPPER ARM  ;  Surgeon: Youlanda Roys, MD;  Location: [place];  Service: Plastics;  Laterality: Right;   MELANOMA EXCISION  2003   right calf   OVARIAN CYST REMOVAL     REMOVAL OF STONES  09/17/2019   Procedure: REMOVAL OF STONES;  Surgeon: Ronnette Juniper, MD;  Location: [facility] ENDOSCOPY;  Service: Gastroenterology;;   [name]  09/17/2019   Procedure: SPHINCTEROTOMY;  Surgeon: Ronnette Juniper, MD;  Location: [facility] ENDOSCOPY;  Service: Gastroenterology;;   Patient Active Problem List   Diagnosis Date Noted   Atrial fibrillation with RVR ([place])    Anxiety and depression    Choledocholithiasis    Elevated liver enzymes    Hypotension    Osteopenia    Leukocytopenia 06/23/2014   HYPERLIPIDEMIA-MIXED 10/12/2009   HYPERTENSION, BENIGN 10/12/2009   AORTIC INSUFFICIENCY 10/12/2009   ANXIETY DEPRESSION 10/08/2009   GERD 10/08/2009   PMS 10/08/2009  Disorder of bone and cartilage 10/08/2009   COLONIC POLYPS, ADENOMATOUS, HX OF 10/08/2009   MIGRAINES, HX OF 10/08/2009  PCP: Deland Pretty, MD   REFERRING PROVIDER: Gregor Hams, MD   REFERRING DIAG: [phone] (ICD-10-CM) - Left hip pain   THERAPY DIAG:  Pain in left hip   Muscle weakness (generalized)   ONSET DATE: 05/20/21 no MOI   SUBJECTIVE:    SUBJECTIVE STATEMENT: Eval: State that she was walking a lot after last session. States that she had severe burning pain 8/10 along the right knee that night and into the next day. States she took things easy and then it felt better by Saturday.   07/21/2021 States that she felt like she had a set back yesterday when she went to the gym with her trainer and she had to stand on her leg. States that today it feels stiff and swollen. States she did her exercises  and she felt the exercises helped. States she  still has pain from yesterday marching/standing on one leg. States she feels she can go up and down stairs more intentional with less pain.   PERTINENT HISTORY: anixeity, migraines, pseudogout,  HTN, leaky valve, arrhythmia, right knee OA and ROM limitations   PAIN:  Are you having pain? 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     COGNITION:          Overall cognitive status: Within functional limits for tasks assessed              Observation:           Swelling at knee joint 07/21/2021                     Left 37.75 cm Right 38.75cm     MUSCLE LENGTH: Hamstrings: Right 30 deg; Left 20 deg     PALPATION: tenderness to palpation in left anterior hip    LE Measurements:       Lower Extremity Right 07/21/2021  Left 07/21/2021     A/PROM MMT A/PROM MMT  Hip Flexion 110 4+ 110 5  Hip Extension   4   4+  Hip Abduction          Hip Adduction          Hip Internal rotation 30   45    Hip External rotation 45   55    Knee Flexion 126 4+ 140 4+  Knee Extension 8 (lacking)* 4* 0 4+  Ankle Dorsiflexion          Ankle Plantarflexion          Ankle Inversion          Ankle Eversion           (Blank rows = not tested) * = pain ** = guarding       GAIT: Comments: reduced right TKE, antalgic gait, limited hip mobility       TODAY'S TREATMENT: 07/21/2021 Therapeutic Exercise: Prone: hip extension 4x5 5" holds, quad stretch x4 60" holds R strap Aerobic:  Seated: self mobilization with tiger tail 6 minutes to right leg,  reviewed HEP, STS x5 25" height  06/08/21 Therapeutic Exercise:           Aerobic: Supine: bridge 2x10 5" holds, heel slide x10 R 5" holds Prone:           Seated:heel slide x10 R 5" holds           Standing: hip flexor stretch x10 10" holds L  STS x20 elevated surface (25 inch height) slow with leg press focus, LAQs x25 Bilat for ROM; STS hands on thighs 17" height chair focus on knees pressing out - x20, self mobilization to right leg.    PATIENT EDUCATION:  Education:on current presentation, muscle length, typical pain noted and different types of pain. On focus moving forward.  Person educated: Patient Education method: Explanation, Demonstration, and Handouts Education comprehension: verbalized understanding       HOME EXERCISE PROGRAM: [id number]   ASSESSMENT:   CLINICAL IMPRESSION: Patient is a 71 y.o. female who was seen today for physical therapy evaluation and treatment for left hip pain. Objective impairments include Abnormal gait, decreased activity tolerance, decreased endurance, difficulty walking, decreased ROM, decreased strength, increased edema, and pain. These impairments are limiting patient from cleaning, community activity, and yard work. Personal factors including Age and Fitness are also affecting patient's functional outcome. Patient will benefit from skilled PT to address above impairments and improve overall function.  07/21/2021  Patient present for a progress note on this date. Overall she has made great progress in regards to her left hip. She has met 2/3 short term goals and 2/3 long term goals. Patient continues to have fears of hurting her arthritis with knee exercises. Reassured patient importance of mobility and strength training to reduce arthritis progression and typical healing process. Session focused on reassurance and education secondary to continued uncertainties. Extending POC to continue to work on right knee and overall functional mobility.    REHAB POTENTIAL: Good   CLINICAL DECISION MAKING: Stable/uncomplicated   EVALUATION COMPLEXITY: Low     GOALS: Goals reviewed with patient? Yes   SHORT TERM GOALS:   STG Name Target Date Goal status  1 Patient will be independent in self management strategies to improve quality of life and functional outcomes. Baseline:  07/06/2021  MET  2 Patient will report at least 50% improvement in overall symptoms and/or function to demonstrate improved functional mobility 96% betting in hip 50% better in right knee Baseline: 0% 07/06/2021  MET  3 Patient will be able to demonstrate at least 5-130 degrees of right knee ROM Baseline: 07/06/2021  PROGRESSING  4   Baseline:      5   Baseline:      6   Baseline:      7   Baseline:        LONG TERM GOALS:    LTG Name Target Date Goal status  1 Patient will report at least 75% improvement in overall symptoms and/or function to demonstrate improved functional mobility Baseline:0% 08/03/2021  PROGRESSING  2 Patient will demonstrate reduced swelling in knee by having measurements within  1 cm of each other Baseline: 08/03/2021  MET   3 Patient will report returning to [facility] for return to PLOF Baseline: 08/03/2021  MET  4   Baseline:      5   Baseline:      6   Baseline:      7   Baseline:  PLAN: PT FREQUENCY: 1-2x/week for total of 12 visits   PT DURATION: 8 weeks   PLANNED INTERVENTIONS: Therapeutic exercises, Therapeutic activity, Neuro Muscular re-education, Balance training, Gait training, Patient/Family education, Joint mobilization, Aquatic Therapy, Dry Needling, Cryotherapy, Moist heat, Ionotophoresis 2m/ml Dexamethasone, and Manual therapy   PLAN FOR NEXT SESSION: hip strengthening, quad strengthening, hip and knee mobility    12:14 PM, 07/21/21 MJerene Pitch DPT [facility] [phone]

## 2021-07-21 NOTE — Telephone Encounter (Signed)
Patient called and states her BP is doing great and so she wishes to not just stop taking irbesartan.   Shes not sure how long she's had the cough but at least a couple weeks.  And she is seeing physical therapy and they are who happened to note that her ankles were swollen.   ? ?Pt also asks in the future would she be able to decrease Cardizem.   ? ?I let her know I would forward her information to Dr. Angelena Form and our office PharmD for review and recommendations.  She is willing to come for a HTN clinic visit if needed. ?

## 2021-07-25 ENCOUNTER — Encounter: Payer: Self-pay | Admitting: Physical Therapy

## 2021-07-25 ENCOUNTER — Ambulatory Visit: Payer: Medicare Other | Admitting: Physical Therapy

## 2021-07-25 DIAGNOSIS — M6281 Muscle weakness (generalized): Secondary | ICD-10-CM

## 2021-07-25 DIAGNOSIS — M25552 Pain in left hip: Secondary | ICD-10-CM | POA: Diagnosis not present

## 2021-07-25 NOTE — Therapy (Signed)
OUTPATIENT PHYSICAL THERAPY TREATMENT NOTE    Patient Name: Kristine Jones MRN: [id number] DOB:1951/04/09, 71 y.o., female Today's Date: 07/25/2021   PCP: Deland Pretty, MD REFERRING PROVIDER: Deland Pretty, MD   PT End of Session - 07/25/21 1102     Visit Number 11    Number of Visits 22    Date for PT Re-Evaluation 09/01/21    Authorization Type BCBS    Progress Note Due on Visit 20    PT Start Time 1102    PT Stop Time 1140    PT Time Calculation (min) 38 min             Past Medical History:  Diagnosis Date   Abnormal stress test    a. 09/2008, ST depression on ETT @ [facility], no further w/u. // Cath 8/14: no CAD   Adenomatous colon polyp    Anxiety and depression    Aortic insufficiency    a. 06/2012 Echo: EF 55-60%, Gr 1 DD, Mild AI, Triv MR, mildly dil LA, nl RV fxn. // Echo 03/2019: EF 60-65, normal RVSF, mod AI, trace MR, trivial TR, Asc Aorta 38 mm, GLS -18.1%   Enlarged thoracic aorta ([place])    MRA 4/19: 4.1 cm // CT 09/2019: 4 cm // Chest CTA 5/22: Ascending aorta 3.8 cm    History of melanoma    Followed with Dr. Tonia Brooms   HTN (hypertension)    Hyperlipidemia    Leukocytosis    Mild   Migraines    Osteopenia    Paroxysmal atrial fibrillation ([place])    admx in 09/2019 w/ choledocho lithiasis >> ERCP // several days later presented to ED with AF with RVR >> DCCV in ED unsuccessful; CHADS-VASc 4 >> Apixaban started for at least 1 month - ?? long term Rx   Varicose veins    Past Surgical History:  Procedure Laterality Date   BTL     CHOLECYSTECTOMY     DILATION AND CURETTAGE OF UTERUS     ERCP N/A 09/17/2019   Procedure: ENDOSCOPIC RETROGRADE CHOLANGIOPANCREATOGRAPHY (ERCP);  Surgeon: Ronnette Juniper, MD;  Location: [place];  Service: Gastroenterology;  Laterality: N/A;   FEMORAL HERNIA REPAIR     High endocervical polyp removed     IR EMBO VENOUS NOT HEMORR HEMANG  INC GUIDE ROADMAPPING  07/20/2020   LIPOMA EXCISION Right 03/23/2015   Procedure:  EXCISION LARGE LIPOMA RIGHT UPPER ARM  ;  Surgeon: Youlanda Roys, MD;  Location: [place];  Service: Plastics;  Laterality: Right;   MELANOMA EXCISION  2003   right calf   OVARIAN CYST REMOVAL     REMOVAL OF STONES  09/17/2019   Procedure: REMOVAL OF STONES;  Surgeon: Ronnette Juniper, MD;  Location: [facility] ENDOSCOPY;  Service: Gastroenterology;;   [name]  09/17/2019   Procedure: SPHINCTEROTOMY;  Surgeon: Ronnette Juniper, MD;  Location: [facility] ENDOSCOPY;  Service: Gastroenterology;;   Patient Active Problem List   Diagnosis Date Noted   Atrial fibrillation with RVR ([place])    Anxiety and depression    Choledocholithiasis    Elevated liver enzymes    Hypotension    Osteopenia    Leukocytopenia 06/23/2014   HYPERLIPIDEMIA-MIXED 10/12/2009   HYPERTENSION, BENIGN 10/12/2009   AORTIC INSUFFICIENCY 10/12/2009   ANXIETY DEPRESSION 10/08/2009   GERD 10/08/2009   PMS 10/08/2009   Disorder of bone and cartilage 10/08/2009   COLONIC POLYPS, ADENOMATOUS, HX OF 10/08/2009   MIGRAINES, HX OF 10/08/2009  PCP: Shelia Media,  Thayer Jew, MD   REFERRING PROVIDER: Gregor Hams, MD   REFERRING DIAG: [phone] (ICD-10-CM) - Left hip pain   THERAPY DIAG:  Pain in left hip   Muscle weakness (generalized)   ONSET DATE: 05/20/21 no MOI   SUBJECTIVE:    SUBJECTIVE STATEMENT: Eval: State that she was walking a lot after last session. States that she had severe burning pain 8/10 along the right knee that night and into the next day. States she took things easy and then it felt better by Saturday.   07/25/2021 States that it hasn't felt great since that day at the [facility]. She has been doing her exercises. Reports just stiffness noted.   PERTINENT HISTORY: anixeity, migraines, pseudogout,  HTN, leaky valve, arrhythmia, right knee OA and ROM limitations   PAIN:  Are you having pain? 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     COGNITION:          Overall cognitive status: Within functional limits for tasks assessed              Observation:           Swelling at knee joint 07/21/21                     Left 37.75 cm Right 38.75cm     MUSCLE LENGTH: Hamstrings: Right 30 deg; Left 20 deg     PALPATION: tenderness to palpation in left anterior hip    LE Measurements:       Lower Extremity Right 07/21/21  Left 07/21/21     A/PROM MMT A/PROM MMT  Hip Flexion 110 4+ 110 5  Hip Extension   4   4+  Hip Abduction          Hip Adduction          Hip Internal rotation 30   45    Hip External rotation 45   55    Knee Flexion 126 4+ 140 4+  Knee Extension 8 (lacking)* 4* 0 4+  Ankle Dorsiflexion          Ankle Plantarflexion          Ankle Inversion          Ankle Eversion           (Blank rows = not tested) * = pain ** = guarding       GAIT: Comments: reduced right TKE, antalgic gait, limited hip mobility       TODAY'S TREATMENT: 07/25/2021 Therapeutic Exercise: Prone: hip extension 4x5 5" holds, quad stretch x4 60" holds R strap Aerobic:  Standing: TKE ball 3x10 5" holds R, mini squats x15 5" holds  Seated: hamstring stretch x3 30" holds R, LAQs 2x15 5" holds R  S/L: Clamshell 2x8 bilateral  06/08/21 Therapeutic Exercise:           Aerobic: Supine: bridge 2x10 5" holds, heel slide x10 R 5" holds Prone:           Seated:heel slide x10 R 5" holds  Standing: hip flexor stretch x10 10" holds L  STS x20 elevated surface (25 inch height) slow with leg press focus, LAQs x25 Bilat for ROM; STS hands on thighs 17"  height chair focus on knees pressing out - x20, self mobilization to right leg.    PATIENT EDUCATION:  Education:on anatomy, HEP Person educated: Patient Education method: Explanation, Demonstration, and Handouts Education comprehension: verbalized understanding       HOME EXERCISE PROGRAM: [id number]   ASSESSMENT:   CLINICAL IMPRESSION: Patient is a 71 y.o. female who was seen today for physical therapy evaluation and treatment for left hip pain. Objective impairments include Abnormal gait, decreased activity tolerance, decreased endurance, difficulty walking, decreased ROM, decreased strength, increased edema, and pain. These impairments are limiting patient from cleaning, community activity, and yard work. Personal factors including Age and Fitness are also affecting patient's functional outcome. Patient will benefit from skilled PT to address above impairments and improve overall function.  07/25/2021  Continued to progress exercises as tolerated. Standing exercises still with low tolerance. Educated patient in compressive forces, difference between open and closed chain and current goals in closed chain. Added clamshells which were very difficult for patient but no pain reported. Fatigue but no pain noted end of session. Will consolidate programs into A and B days next session.  REHAB POTENTIAL: Good   CLINICAL DECISION MAKING: Stable/uncomplicated   EVALUATION COMPLEXITY: Low     GOALS: Goals reviewed with patient? Yes   SHORT TERM GOALS:   STG Name Target Date Goal status  1 Patient will be independent in self management strategies to improve quality of life and functional outcomes. Baseline:  07/06/2021  MET  2 Patient will report at least 50% improvement in overall symptoms and/or function to demonstrate improved functional mobility 96% betting in hip 50% better in right knee Baseline: 0% 07/06/2021  MET  3 Patient will be able to demonstrate at least 5-130 degrees of  right knee ROM Baseline: 07/06/2021  PROGRESSING  4   Baseline:      5   Baseline:      6   Baseline:      7   Baseline:        LONG TERM GOALS:    LTG Name Target Date Goal status  1 Patient will report at least 75% improvement in overall symptoms and/or function to demonstrate improved functional mobility Baseline:0% 08/03/2021  PROGRESSING  2 Patient will demonstrate reduced swelling in knee by having measurements within  1 cm of each other Baseline: 08/03/2021  MET   3 Patient will report returning to [facility] for return to PLOF Baseline: 08/03/2021  MET  4   Baseline:      5   Baseline:      6   Baseline:      7   Baseline:            PLAN: PT FREQUENCY: 1-2x/week for total of 12 visits   PT DURATION: 8 weeks   PLANNED INTERVENTIONS: Therapeutic exercises, Therapeutic activity, Neuro Muscular re-education, Balance training, Gait training, Patient/Family education, Joint mobilization, Aquatic Therapy, Dry Needling, Cryotherapy, Moist heat, Ionotophoresis 28m/ml Dexamethasone, and Manual therapy   PLAN FOR NEXT SESSION: consolidate programs into A and B days, hip strengthening, quad strengthening, hip and knee mobility    12:14 PM, 07/25/21 MJerene Pitch DPT [facility] [phone]

## 2021-07-28 ENCOUNTER — Encounter: Payer: Self-pay | Admitting: Physical Therapy

## 2021-07-28 ENCOUNTER — Ambulatory Visit: Payer: Medicare Other | Admitting: Physical Therapy

## 2021-07-28 DIAGNOSIS — M25552 Pain in left hip: Secondary | ICD-10-CM | POA: Diagnosis not present

## 2021-07-28 DIAGNOSIS — M6281 Muscle weakness (generalized): Secondary | ICD-10-CM | POA: Diagnosis not present

## 2021-07-28 NOTE — Therapy (Signed)
?OUTPATIENT PHYSICAL THERAPY TREATMENT NOTE  ? ? ?Patient Name: Kristine Jones ?MRN: [id number] ?DOB:02/05/1951, 71 y.o., female ?Today's Date: 07/28/2021 ? ? ?PCP: Deland Pretty, MD ?REFERRING PROVIDER: Deland Pretty, MD ? ? PT End of Session - 07/28/21 1059   ? ? Visit Number 12   ? Number of Visits 22   ? Date for PT Re-Evaluation 09/01/21   ? Authorization Type BCBS   ? Progress Note Due on Visit 20   ? PT Start Time 1100   ? PT Stop Time 1144   ? PT Time Calculation (min) 44 min   ? ?  ?  ? ?  ? ? ?Past Medical History:  ?Diagnosis Date  ? Abnormal stress test   ? a. 09/2008, ST depression on ETT @ [facility], no further w/u. // Cath 8/14: no CAD  ? Adenomatous colon polyp   ? Anxiety and depression   ? Aortic insufficiency   ? a. 06/2012 Echo: EF 55-60%, Gr 1 DD, Mild AI, Triv MR, mildly dil LA, nl RV fxn. // Echo 03/2019: EF 60-65, normal RVSF, mod AI, trace MR, trivial TR, Asc Aorta 38 mm, GLS -18.1%  ? Enlarged thoracic aorta ([place])   ? MRA 4/19: 4.1 cm // CT 09/2019: 4 cm // Chest CTA 5/22: Ascending aorta 3.8 cm   ? History of melanoma   ? Followed with Dr. Tonia Brooms  ? HTN (hypertension)   ? Hyperlipidemia   ? Leukocytosis   ? Mild  ? Migraines   ? Osteopenia   ? Paroxysmal atrial fibrillation ([facility])   ? admx in 09/2019 w/ choledocho lithiasis >> ERCP // several days later presented to ED with AF with RVR >> DCCV in ED unsuccessful; CHADS-VASc 4 >> Apixaban started for at least 1 month - ?? long term Rx  ? Varicose veins   ? ?Past Surgical History:  ?Procedure Laterality Date  ? BTL    ? CHOLECYSTECTOMY    ? DILATION AND CURETTAGE OF UTERUS    ? ERCP N/A 09/17/2019  ? Procedure: ENDOSCOPIC RETROGRADE CHOLANGIOPANCREATOGRAPHY (ERCP);  Surgeon: Ronnette Juniper, MD;  Location: [place];  Service: Gastroenterology;  Laterality: N/A;  ? FEMORAL HERNIA REPAIR    ? High endocervical polyp removed    ? IR EMBO VENOUS NOT HEMORR HEMANG  INC GUIDE ROADMAPPING  07/20/2020  ? LIPOMA EXCISION Right 03/23/2015  ? Procedure:  EXCISION LARGE LIPOMA RIGHT UPPER ARM  ;  Surgeon: Youlanda Roys, MD;  Location: [place];  Service: Plastics;  Laterality: Right;  ? MELANOMA EXCISION  2003  ? right calf  ? OVARIAN CYST REMOVAL    ? REMOVAL OF STONES  09/17/2019  ? Procedure: REMOVAL OF STONES;  Surgeon: Ronnette Juniper, MD;  Location: [place];  Service: Gastroenterology;;  ? SPHINCTEROTOMY  09/17/2019  ? Procedure: SPHINCTEROTOMY;  Surgeon: Ronnette Juniper, MD;  Location: [facility] ENDOSCOPY;  Service: Gastroenterology;;  ? ?Patient Active Problem List  ? Diagnosis Date Noted  ? Atrial fibrillation with RVR ([place])   ? Anxiety and depression   ? Choledocholithiasis   ? Elevated liver enzymes   ? Hypotension   ? Osteopenia   ? Leukocytopenia 06/23/2014  ? HYPERLIPIDEMIA-MIXED 10/12/2009  ? HYPERTENSION, BENIGN 10/12/2009  ? AORTIC INSUFFICIENCY 10/12/2009  ? ANXIETY DEPRESSION 10/08/2009  ? GERD 10/08/2009  ? PMS 10/08/2009  ? Disorder of bone and cartilage 10/08/2009  ? COLONIC POLYPS, ADENOMATOUS, HX OF 10/08/2009  ? MIGRAINES, HX OF 10/08/2009  ?PCP: Shelia Media,  Thayer Jew, MD ?  ?REFERRING PROVIDER: Gregor Hams, MD ?  ?REFERRING DIAG: M25.552 (ICD-10-CM) - Left hip pain ?  ?THERAPY DIAG:  ?Pain in left hip ?  ?Muscle weakness (generalized) ?  ?ONSET DATE: 05/20/21 no MOI ?  ?SUBJECTIVE:  ?  ?SUBJECTIVE STATEMENT: ?Eval: State that she was walking a lot after last session. States that she had severe burning pain 8/10 along the right knee that night and into the next day. States she took things easy and then it felt better by Saturday.  ? ?07/28/2021 ?States she did a lot of walking yesterday and has a stiff knee. States that her knee didn't bother her yesterday. ?  ?PERTINENT HISTORY: ?anixeity, migraines, pseudogout,  HTN, leaky valve, arrhythmia, right knee OA and ROM limitations ?  ?PAIN:  ?Are you having pain? yes ?NPRS scale: 4/10 ?Pain location:right knee ?Pain orientation: Right,  ?PAIN TYPE: stiffness ?Pain description: intermittent   ?Aggravating factors: AM,  ?Relieving factors: heat, walking ? 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  ?PLOF: Independent ?  ?PATIENT GOALS would like to get pain to go away ?  ?  ?OBJECTIVE:  ?  ?DIAGNOSTIC FINDINGS:  ?xray 05/31/21 left hip ?IMPRESSION: ?Degenerative change without acute abnormality. ?  ?  ?COGNITION: ?         Overall cognitive status: Within functional limits for tasks assessed              ?Observation:  ?         Swelling at knee joint 07/21/21 ? ?                   Left 37.75 cm Right 38.75cm ?  ?  ?MUSCLE LENGTH: ?Hamstrings: Right 30 deg; Left 20 deg ?  ?  ?PALPATION: ?tenderness to palpation in left anterior hip  ?  ?LE Measurements: ?      ?Lower Extremity Right ?07/21/21 ? Left ?07/21/21 ?  ?  A/PROM MMT A/PROM MMT  ?Hip Flexion 110 4+ 110 5  ?Hip Extension   4   4+  ?Hip Abduction          ?Hip Adduction          ?Hip Internal rotation 30   45    ?Hip External rotation 45   55    ?Knee Flexion 126 4+ 140 4+  ?Knee Extension 8 (lacking)* 4* 0 4+  ?Ankle Dorsiflexion          ?Ankle Plantarflexion          ?Ankle Inversion          ?Ankle Eversion          ? (Blank rows = not tested) ?* = pain ?** = guarding ?  ?  ?  ?GAIT: ?Comments: reduced right TKE, antalgic gait, limited hip mobility ?  ?  ?  ?TODAY'S TREATMENT: ?07/28/2021 ?Therapeutic Exercise: ?Quadruped: rock backs 2 minutes, bear plank 3x3 5-10" holds ?Seated: self mobilization with tiger tail 3 minutes, hamstring stretch x3 30" holds R, LAQs 2x15 5" holds R  ?S/L: Clamshell 2x8 bilateral ?Supine:heel slides R x10 pain-  no pain after knee cap mobilizations x10 R  ? Manual: PT performed knee cap mobilizations 8 minutes ? ?06/08/21 ?Therapeutic Exercise: ?          Aerobic: ?Supine: bridge 2x10 5" holds, heel slide x10 R 5" holds ?Prone: hip extension 4x5 5" holds, quad stretch x4  60" holds R strap ?          Seated:heel slide x10 R 5" holds ?          Standing: hip flexor  stretch x10 10" holds L, TKE ball 3x10 5" holds R, mini squats x15 5" holds  ? STS x20 elevated surface (25 inch height) slow with leg press focus, LAQs x25 Bilat for ROM; STS hands on thighs 17" height chair focus on knees pressing out - x20, self mobilization to right leg.  ?  ?PATIENT EDUCATION:  ?Education:on anatomy, HEP ?Person educated: Patient ?Education method: Explanation, Demonstration, and Handouts ?Education comprehension: verbalized understanding ?  ?  ?  ?HOME EXERCISE PROGRAM: ?[id number] ?  ?ASSESSMENT: ?  ?CLINICAL IMPRESSION: ?Eval: Patient is a 71 y.o. female who was seen today for physical therapy evaluation and treatment for left hip pain. Objective impairments include Abnormal gait, decreased activity tolerance, decreased endurance, difficulty walking, decreased ROM, decreased strength, increased edema, and pain. These impairments are limiting patient from cleaning, community activity, and yard work. Personal factors including Age and Fitness are also affecting patient's functional outcome. Patient will benefit from skilled PT to address above impairments and improve overall function. ? ?07/28/2021 ?Session focused on education and progression of exercises. Patient unsure about patella mobility and initially presented with significant amount of guarding that mobilization was unable to be performed. Educated patient in anatomy and rationale for exercises and this helped. After performing patella mobilizations, returned to heel slides and this was no longer catching as it was prior to the mobilizations. Progressed quadruped exercises and no pain in knee noted with these exercises. Will continue with current POC as tolerated.  ? ?REHAB POTENTIAL: Good ?  ?CLINICAL DECISION MAKING: Stable/uncomplicated ?  ?EVALUATION COMPLEXITY: Low ?  ?  ?GOALS: ?Goals reviewed with patient? Yes ?  ?SHORT TERM GOALS: ?  ?STG Name Target Date Goal status  ?1 Patient will be independent in self management strategies  to improve quality of life and functional outcomes. ?Baseline:  07/06/2021  MET  ?2 Patient will report at least 50% improvement in overall symptoms and/or function to demonstrate improved functional mobility ?

## 2021-08-01 ENCOUNTER — Ambulatory Visit
Admission: RE | Admit: 2021-08-01 | Discharge: 2021-08-01 | Disposition: A | Discharge status: Home / Self Care | Payer: Medicare Other | Source: Ambulatory Visit | Attending: Interventional Radiology | Admitting: Interventional Radiology

## 2021-08-01 DIAGNOSIS — I872 Venous insufficiency (chronic) (peripheral): Secondary | ICD-10-CM | POA: Diagnosis not present

## 2021-08-01 DIAGNOSIS — I82812 Embolism and thrombosis of superficial veins of left lower extremities: Secondary | ICD-10-CM | POA: Diagnosis not present

## 2021-08-01 NOTE — Progress Notes (Signed)
Patient ID: Kristine Jones, female   DOB: 25-Dec-1950, 70 y.o.   MRN: [id number]       Chief Complaint:  Left GSV venous insufficiency, symptomatic varicose vein  Referring Physician(s): Sora Olivo  History of Present Illness: Kristine Jones is a 71 y.o. female who is now 1 year status post left GSV transcatheter laser occlusion and calf and anterior tibial varicose vein foam sclerotherapy, treatments performed in March and April 2022.  Overall she continues to do very well.  Excellent functional status.  No physical limitations.  She has a very active lifestyle and routinely exercises and walks.  She has been compliant with prescription strength thigh-high compression stockings 2-3 times a week.  No current symptoms.  No leg pain, fatigue, or heaviness.  Ultrasound performed today confirms occlusion of the left GSV treated segment with further scarring and retraction.  Left GSV is barely visible.  There are small residual anterior tibial subsurface varicosities which are compressible and remain patent suspect related to small tibial region perforators.  No significant large subsurface varicose veins that warrant additional sclerotherapy today.  There are a few scattered spider veins that she directs attention to in the ankle regions.  Past Medical History:  Diagnosis Date   Abnormal stress test    a. 09/2008, ST depression on ETT @ [facility], no further w/u. // Cath 8/14: no CAD   Adenomatous colon polyp    Anxiety and depression    Aortic insufficiency    a. 06/2012 Echo: EF 55-60%, Gr 1 DD, Mild AI, Triv MR, mildly dil LA, nl RV fxn. // Echo 03/2019: EF 60-65, normal RVSF, mod AI, trace MR, trivial TR, Asc Aorta 38 mm, GLS -18.1%   Enlarged thoracic aorta (HCC)    MRA 4/19: 4.1 cm // CT 09/2019: 4 cm // Chest CTA 5/22: Ascending aorta 3.8 cm    History of melanoma    Followed with Dr. Danella Deis   HTN (hypertension)    Hyperlipidemia    Leukocytosis    Mild   Migraines     Osteopenia    Paroxysmal atrial fibrillation (HCC)    admx in 09/2019 w/ choledocho lithiasis >> ERCP // several days later presented to ED with AF with RVR >> DCCV in ED unsuccessful; CHADS-VASc 4 >> Apixaban started for at least 1 month - ?? long term Rx   Varicose veins     Past Surgical History:  Procedure Laterality Date   BTL     CHOLECYSTECTOMY     DILATION AND CURETTAGE OF UTERUS     ERCP N/A 09/17/2019   Procedure: ENDOSCOPIC RETROGRADE CHOLANGIOPANCREATOGRAPHY (ERCP);  Surgeon: Kerin Salen, MD;  Location: [facility] ENDOSCOPY;  Service: Gastroenterology;  Laterality: N/A;   FEMORAL HERNIA REPAIR     High endocervical polyp removed     IR EMBO VENOUS NOT HEMORR HEMANG  INC GUIDE ROADMAPPING  07/20/2020   LIPOMA EXCISION Right 03/23/2015   Procedure: EXCISION LARGE LIPOMA RIGHT UPPER ARM  ;  Surgeon: Eloise Levels, MD;  Location: [facility];  Service: Plastics;  Laterality: Right;   MELANOMA EXCISION  2003   right calf   OVARIAN CYST REMOVAL     REMOVAL OF STONES  09/17/2019   Procedure: REMOVAL OF STONES;  Surgeon: Kerin Salen, MD;  Location: [facility] ENDOSCOPY;  Service: Gastroenterology;;   Dennison Mascot  09/17/2019   Procedure: Dennison Mascot;  Surgeon: Kerin Salen, MD;  Location: [facility] ENDOSCOPY;  Service: Gastroenterology;;    Allergies: Indocin [indomethacin],  Ibuprofen, Lisinopril, Naproxen, Naproxen sodium, Penicillins, and Triamterene  Medications: Prior to Admission medications   Medication Sig Start Date End Date Taking? Authorizing Provider  apixaban (ELIQUIS) 5 MG TABS tablet TAKE ONE TABLET BY MOUTH TWICE A DAY 03/14/21   Kathleene Hazel, MD  Calcium Carbonate-Vitamin D (CALCIUM-VITAMIN D3) 600-200 MG-UNIT TABS Take 2 tablets by mouth daily.    [provider]  Cholecalciferol (VITAMIN D) 1000 UNITS capsule Take 1,000 Units by mouth daily.    [provider]  diltiazem (CARDIZEM CD) 120 MG 24 hr capsule Take one tablet by mouth every  evening.  Take in addition to diltiazem 180 mg every morning. 11/25/20   Kathleene Hazel, MD  diltiazem (CARDIZEM CD) 180 MG 24 hr capsule TAKE ONE CAPSULE BY MOUTH DAILY 04/06/21   Kathleene Hazel, MD  estradiol (ESTRACE) 0.1 MG/GM vaginal cream Place 1 Applicatorful vaginally as needed (vaginal discomfort).     [provider]  fish oil-omega-3 fatty acids 1000 MG capsule Take 1 g by mouth daily.    [provider]  fluticasone (FLONASE) 50 MCG/ACT nasal spray Place 1 spray into both nostrils daily as needed for allergies or rhinitis.    [provider]  irbesartan (AVAPRO) 75 MG tablet Take 1 tablet (75 mg total) by mouth daily. 08/10/20   Kathleene Hazel, MD  metoprolol tartrate (LOPRESSOR) 50 MG tablet Take one tablet 2 hours before CT scan 09/09/20   Kathleene Hazel, MD  multivitamin [facility]) per tablet Take 1 tablet by mouth daily.    [provider]  sertraline (ZOLOFT) 50 MG tablet Take 50 mg by mouth daily.    [provider]     Family History  Problem Relation Age of Onset   Hypertension Mother    Migraines Mother    Hypertension Father    Lung cancer Father     Social History   Socioeconomic History   Marital status: Married    Spouse name: Not on file   Number of children: Not on file   Years of education: Not on file   Highest education level: Not on file  Occupational History   Occupation: Network engineer    Comment: Freelance  Tobacco Use   Smoking status: Never   Smokeless tobacco: Never  Vaping Use   Vaping Use: Never used  Substance and Sexual Activity   Alcohol use: Yes    Alcohol/week: 1.0 standard drink    Types: 1 Standard drinks or equivalent per week    Comment: 1 glass of wine per week   Drug use: No   Sexual activity: Not on file  Other Topics Concern   Not on file  Social History Narrative   Married   1 daughter   Gets regular exercise: Walking, yoga, weight  lifting   Social Determinants of Health   Financial Resource Strain: Not on file  Food Insecurity: Not on file  Transportation Needs: Not on file  Physical Activity: Not on file  Stress: Not on file  Social Connections: Not on file     Review of Systems: A 12 point ROS discussed and pertinent positives are indicated in the HPI above.  All other systems are negative.  Review of Systems  Vital Signs: There were no vitals taken for this visit.  Physical Exam Constitutional:      Appearance: She is normal weight. She is not toxic-appearing or diaphoretic.  Eyes:     General: No scleral icterus.  Conjunctiva/sclera: Conjunctivae normal.  Musculoskeletal:        General: No swelling, tenderness or deformity.     Right lower leg: No edema.     Left lower leg: No edema.     Comments: Scattered lower calf and ankle spider veins.  No peripheral edema.  No skin lesions.  There are faintly visible left anterior tibial subsurface dilated reticular veins versus early varicose veins.  These are currently asymptomatic.  Skin:    General: Skin is warm and dry.     Coloration: Skin is not jaundiced.     Findings: No rash.  Neurological:     General: No focal deficit present.     Mental Status: She is alert. Mental status is at baseline.  Psychiatric:        Mood and Affect: Mood normal.        Thought Content: Thought content normal.    Imaging: US Venous Img Lower Unilateral Left (DVT)  Result Date: 08/01/2021 CLINICAL DATA:  one year status post left gsv transcatheter laser occlusion as well as left calf and tibial region sclerotherapy EXAM: LEFT LOWER EXTREMITY VENOUS DOPPLER ULTRASOUND TECHNIQUE: Gray-scale sonography with graded compression, as well as color Doppler and duplex ultrasound were performed to evaluate the lower extremity deep venous systems from the level of the common femoral vein and including the common femoral, femoral, profunda femoral, popliteal and calf veins  including the posterior tibial, peroneal and gastrocnemius veins when visible. The superficial great saphenous vein was also interrogated. Spectral Doppler was utilized to evaluate flow at rest and with distal augmentation maneuvers in the common femoral, femoral and popliteal veins. COMPARISON:  None. FINDINGS: Contralateral Common Femoral Vein: Respiratory phasicity is normal and symmetric with the symptomatic side. No evidence of thrombus. Normal compressibility. Common Femoral Vein: No evidence of thrombus. Normal compressibility, respiratory phasicity and response to augmentation. Saphenofemoral Junction: No evidence of thrombus. Normal compressibility and flow on color Doppler imaging. Profunda Femoral Vein: No evidence of thrombus. Normal compressibility and flow on color Doppler imaging. Femoral Vein: No evidence of thrombus. Normal compressibility, respiratory phasicity and response to augmentation. Popliteal Vein: No evidence of thrombus. Normal compressibility, respiratory phasicity and response to augmentation. Calf Veins: No evidence of thrombus. Normal compressibility and flow on color Doppler imaging. Superficial Great Saphenous Vein: difficult to image. treated segment has retracted and scarred down. no significant recanalized segment. Venous Reflux:  None. Other Findings: Small superficial anterior tibial sub surface dilated veins noted which are compressible, and associated with a few scattered perforator veins. IMPRESSION: Negative for DVT. Left GSV treated segment has retracted and scarred down. Residual small patent superficial anterior tibial sub surface dilated veins Electronically Signed   By: Judie Petit.  Glyn Zendejas M.D.   On: 08/01/2021 13:18   [place] RAD EVAL AND MGMT  Result Date: 08/01/2021 Please refer to "Notes" to see consult details.   Labs:  CBC: No results for input(s): WBC, HGB, HCT, PLT in the last 8760 hours.  COAGS: No results for input(s): INR, APTT in the last 8760  hours.  BMP: Recent Labs    08/30/20 1035  NA 141  K 4.2  CL 101  CO2 26  GLUCOSE 88  BUN 14  CALCIUM 10.5*  CREATININE 0.64    LIVER FUNCTION TESTS: No results for input(s): BILITOT, AST, ALT, ALKPHOS, PROT, ALBUMIN in the last 8760 hours.   Assessment and Plan:  1 year status post left GSV transcatheter laser occlusion.  Residual varicosities have had 2  rounds of foam sclerotherapy.  Overall she is doing very well.  No residual symptoms related to venous insufficiency or varicose veins.  No leg pain.  She continues to intermittently wear 20/30 mmHg compression stockings.  Ultrasound today confirms no evidence of DVT.  Left GSV treated segment has resorbed.  There are small residual anterior tibial subsurface dilated reticular veins versus small varicose veins which are currently asymptomatic.  Plan: Continue knee-high prescription strength compression stockings 20/30 mmHg is much as possible to prevent progression of anterior tibial dilated reticular veins as well as the bilateral calf and ankle spider veins.  Continue routine exercise.  She is in agreement with this conservative management plan.  If the left anterior tibial veins continue to dilate or become symptomatic she has her contact information to be reevaluated on an as-needed basis for any potential future ultrasound foam sclerotherapy.   Electronically Signed: Berdine Dance 08/01/2021, 1:29 PM   I spent a total of    25 Minutes in face to face in clinical consultation, greater than 50% of which was counseling/coordinating care for this patient with venous insufficiency as above

## 2021-08-02 ENCOUNTER — Encounter: Payer: Self-pay | Admitting: Physical Therapy

## 2021-08-02 ENCOUNTER — Ambulatory Visit: Payer: Medicare Other | Admitting: Family Medicine

## 2021-08-02 ENCOUNTER — Ambulatory Visit: Payer: Medicare Other | Admitting: Physical Therapy

## 2021-08-02 DIAGNOSIS — M25552 Pain in left hip: Secondary | ICD-10-CM

## 2021-08-02 DIAGNOSIS — M6281 Muscle weakness (generalized): Secondary | ICD-10-CM | POA: Diagnosis not present

## 2021-08-02 NOTE — Therapy (Signed)
?OUTPATIENT PHYSICAL THERAPY TREATMENT NOTE  ? ? ?Patient Name: Kristine Jones ?MRN: [id number] ?DOB:28-Sep-1950, 71 y.o., female ?Today's Date: 08/02/2021 ? ? ?PCP: Deland Pretty, MD ?REFERRING PROVIDER: Deland Pretty, MD ? ? PT End of Session - 08/02/21 1057   ? ? Visit Number 13   ? Number of Visits 22   ? Date for PT Re-Evaluation 09/01/21   ? Authorization Type BCBS   ? Progress Note Due on Visit 20   ? PT Start Time 1101   ? PT Stop Time 4098   ? PT Time Calculation (min) 41 min   ? ?  ?  ? ?  ? ? ?Past Medical History:  ?Diagnosis Date  ? Abnormal stress test   ? a. 09/2008, ST depression on ETT @ [facility], no further w/u. // Cath 8/14: no CAD  ? Adenomatous colon polyp   ? Anxiety and depression   ? Aortic insufficiency   ? a. 06/2012 Echo: EF 55-60%, Gr 1 DD, Mild AI, Triv MR, mildly dil LA, nl RV fxn. // Echo 03/2019: EF 60-65, normal RVSF, mod AI, trace MR, trivial TR, Asc Aorta 38 mm, GLS -18.1%  ? Enlarged thoracic aorta ([place])   ? MRA 4/19: 4.1 cm // CT 09/2019: 4 cm // Chest CTA 5/22: Ascending aorta 3.8 cm   ? History of melanoma   ? Followed with Dr. Tonia Brooms  ? HTN (hypertension)   ? Hyperlipidemia   ? Leukocytosis   ? Mild  ? Migraines   ? Osteopenia   ? Paroxysmal atrial fibrillation (HCC)   ? admx in 09/2019 w/ choledocho lithiasis >> ERCP // several days later presented to ED with AF with RVR >> DCCV in ED unsuccessful; CHADS-VASc 4 >> Apixaban started for at least 1 month - ?? long term Rx  ? Varicose veins   ? ?Past Surgical History:  ?Procedure Laterality Date  ? BTL    ? CHOLECYSTECTOMY    ? DILATION AND CURETTAGE OF UTERUS    ? ERCP N/A 09/17/2019  ? Procedure: ENDOSCOPIC RETROGRADE CHOLANGIOPANCREATOGRAPHY (ERCP);  Surgeon: Ronnette Juniper, MD;  Location: [place];  Service: Gastroenterology;  Laterality: N/A;  ? FEMORAL HERNIA REPAIR    ? High endocervical polyp removed    ? IR EMBO VENOUS NOT HEMORR HEMANG  INC GUIDE ROADMAPPING  07/20/2020  ? LIPOMA EXCISION Right 03/23/2015  ? Procedure:  EXCISION LARGE LIPOMA RIGHT UPPER ARM  ;  Surgeon: Youlanda Roys, MD;  Location: [place];  Service: Plastics;  Laterality: Right;  ? MELANOMA EXCISION  2003  ? right calf  ? OVARIAN CYST REMOVAL    ? REMOVAL OF STONES  09/17/2019  ? Procedure: REMOVAL OF STONES;  Surgeon: Ronnette Juniper, MD;  Location: [place];  Service: Gastroenterology;;  ? SPHINCTEROTOMY  09/17/2019  ? Procedure: SPHINCTEROTOMY;  Surgeon: Ronnette Juniper, MD;  Location: [facility] ENDOSCOPY;  Service: Gastroenterology;;  ? ?Patient Active Problem List  ? Diagnosis Date Noted  ? Atrial fibrillation with RVR ([place])   ? Anxiety and depression   ? Choledocholithiasis   ? Elevated liver enzymes   ? Hypotension   ? Osteopenia   ? Leukocytopenia 06/23/2014  ? HYPERLIPIDEMIA-MIXED 10/12/2009  ? HYPERTENSION, BENIGN 10/12/2009  ? AORTIC INSUFFICIENCY 10/12/2009  ? ANXIETY DEPRESSION 10/08/2009  ? GERD 10/08/2009  ? PMS 10/08/2009  ? Disorder of bone and cartilage 10/08/2009  ? COLONIC POLYPS, ADENOMATOUS, HX OF 10/08/2009  ? MIGRAINES, HX OF 10/08/2009  ?PCP: Shelia Media,  Thayer Jew, MD ?  ?REFERRING PROVIDER: Gregor Hams, MD ?  ?REFERRING DIAG: M25.552 (ICD-10-CM) - Left hip pain ?  ?THERAPY DIAG:  ?Pain in left hip ?  ?Muscle weakness (generalized) ?  ?ONSET DATE: 05/20/21 no MOI ?  ?SUBJECTIVE:  ?  ?SUBJECTIVE STATEMENT: ?Eval: State that she was walking a lot after last session. States that she had severe burning pain 8/10 along the right knee that night and into the next day. States she took things easy and then it felt better by Saturday.  ? ?08/02/2021 ?States that Thursday she had pain in her knee after her last session. States that she did some of her stretches and it felt better. States she would like Thursday to be her last session for a little bit. States she feels about 50% better since the start of PT. ?  ?PERTINENT HISTORY: ?anixeity, migraines, pseudogout,  HTN, leaky valve, arrhythmia, right knee OA and ROM limitations ?  ?PAIN:  ?Are you  having pain? yes ?NPRS scale: 4/10 ?Pain location:right knee ?Pain orientation: Right,  ?PAIN TYPE: stiffness ?Pain description: intermittent  ?Aggravating factors: AM,  ?Relieving factors: heat, walking ? 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  ?PLOF: Independent ?  ?PATIENT GOALS would like to get pain to go away ?  ?  ?OBJECTIVE:  ?          Swelling at knee joint 07/21/21 ? ?                   Left 37.75 cm Right 38.75cm ?  ?  ?MUSCLE LENGTH: ?Hamstrings: Right 30 deg; Left 20 deg ?  ?  ?PALPATION: ?tenderness to palpation in left anterior hip  ?  ?LE Measurements: ?      ?Lower Extremity Right ?08/02/2021 Left ?08/02/2021  ?  A/PROM MMT A/PROM MMT  ?Hip Flexion 110 4+ 110 5  ?Hip Extension   4   4+  ?Hip Abduction          ?Hip Adduction          ?Hip Internal rotation 30   45    ?Hip External rotation 45   55    ?Knee Flexion 126 4+ 140 4+  ?Knee Extension 8 (lacking)* 4* 0 4+  ?Ankle Dorsiflexion          ?Ankle Plantarflexion          ?Ankle Inversion          ?Ankle Eversion          ? (Blank rows = not tested) ?* = pain ?** = guarding ?  ?  ?  ?GAIT: ?Comments: reduced right TKE, antalgic gait, limited hip mobility ?  ?  ?  ?TODAY'S TREATMENT: ?08/02/2021 ?Neuromuscular re-education: sit to stand 15 minutes on how to move ? ?Therapeutic Exercise: ?Standing: gastroc stretch x3 30" holds bilateral ?Seated: self  knee cap mobilizations R 5 minutes, LAQs x15 10" holds R,  ? ? ?06/08/21 ?Therapeutic Exercise: ?          Aerobic: ?Supine: bridge 2x10 5" holds, heel slide x10 R 5" holds ?Prone: hip extension 4x5 5" holds, quad stretch x4 60" holds R strap ?          Seated:heel slide x10 R 5" holds ?          Standing: hip flexor stretch x10 10" holds L, TKE ball 3x10 5" holds R, mini squats x15 5"  holds  ? STS x20 elevated surface (25 inch height) slow with leg press focus, LAQs x25 Bilat for ROM; STS hands on thighs 17" height chair focus on knees pressing  out - x20, self mobilization to right leg.  ?  ?PATIENT EDUCATION:  ?Education:on anatomy, HEP, on rationale for continued exercises, on transition to hold from PT and rational for anticipated progress. On frequency/duration of exercises ?Person educated: Patient ?Education method: Explanation, Demonstration, and Handouts ?Education comprehension: verbalized understanding ?  ?  ?  ?HOME EXERCISE PROGRAM: ?[id number] ?  ?ASSESSMENT: ?  ?CLINICAL IMPRESSION: ?Eval: Patient is a 71 y.o. female who was seen today for physical therapy evaluation and treatment for left hip pain. Objective impairments include Abnormal gait, decreased activity tolerance, decreased endurance, difficulty walking, decreased ROM, decreased strength, increased edema, and pain. These impairments are limiting patient from cleaning, community activity, and yard work. Personal factors including Age and Fitness are also affecting patient's functional outcome. Patient will benefit from skilled PT to address above impairments and improve overall function. ? ?08/02/2021 ?Session focused on education secondary to patient wanted to take a break from PT secondary to multiple health issues that she would like to focus on. Discussed and educated patient on anticipated progress moving forward and goals with transition to HEP at this time. Session focused on reviewing STS with cues as she continues to use momentum and shift her weight to reduce muscle activation with exercise. Improved ability to engage glutes and quads with this exercise by end of session. No increase in pain noted end of session. Will review HEP and transition to hold next session per patient request. ? ?REHAB POTENTIAL: Good ?  ?CLINICAL DECISION MAKING: Stable/uncomplicated ?  ?EVALUATION COMPLEXITY: Low ?  ?  ?GOALS: ?Goals reviewed with patient? Yes ?  ?SHORT TERM GOALS: ?  ?STG Name Target Date Goal status  ?1 Patient will be independent in self management strategies to improve quality of  life and functional outcomes. ?Baseline:  07/06/2021  MET  ?2 Patient will report at least 50% improvement in overall symptoms and/or function to demonstrate improved functional mobility ?96% betting in

## 2021-08-04 ENCOUNTER — Ambulatory Visit: Payer: Medicare Other | Admitting: Physical Therapy

## 2021-08-04 ENCOUNTER — Other Ambulatory Visit: Payer: Self-pay | Admitting: Cardiovascular Disease

## 2021-08-04 ENCOUNTER — Encounter: Payer: Self-pay | Admitting: Physical Therapy

## 2021-08-04 DIAGNOSIS — M25552 Pain in left hip: Secondary | ICD-10-CM

## 2021-08-04 DIAGNOSIS — M6281 Muscle weakness (generalized): Secondary | ICD-10-CM

## 2021-08-04 NOTE — Therapy (Signed)
?OUTPATIENT PHYSICAL THERAPY TREATMENT NOTE  ? ? ?Patient Name: Kristine Jones ?MRN: [id number] ?DOB:05-28-1950, 71 y.o., female ?Today's Date: 08/04/2021 ? ? ?PCP: Deland Pretty, MD ?REFERRING PROVIDER: Deland Pretty, MD ? ? PT End of Session - 08/04/21 1058   ? ? Visit Number 14   ? Number of Visits 22   ? Date for PT Re-Evaluation 09/01/21   ? Authorization Type BCBS   ? Progress Note Due on Visit 20   ? PT Start Time 1101   ? PT Stop Time 8032   ? PT Time Calculation (min) 41 min   ? ?  ?  ? ?  ? ? ?Past Medical History:  ?Diagnosis Date  ? Abnormal stress test   ? a. 09/2008, ST depression on ETT @ [facility], no further w/u. // Cath 8/14: no CAD  ? Adenomatous colon polyp   ? Anxiety and depression   ? Aortic insufficiency   ? a. 06/2012 Echo: EF 55-60%, Gr 1 DD, Mild AI, Triv MR, mildly dil LA, nl RV fxn. // Echo 03/2019: EF 60-65, normal RVSF, mod AI, trace MR, trivial TR, Asc Aorta 38 mm, GLS -18.1%  ? Enlarged thoracic aorta ([place])   ? MRA 4/19: 4.1 cm // CT 09/2019: 4 cm // Chest CTA 5/22: Ascending aorta 3.8 cm   ? History of melanoma   ? Followed with Dr. Tonia Brooms  ? HTN (hypertension)   ? Hyperlipidemia   ? Leukocytosis   ? Mild  ? Migraines   ? Osteopenia   ? Paroxysmal atrial fibrillation (HCC)   ? admx in 09/2019 w/ choledocho lithiasis >> ERCP // several days later presented to ED with AF with RVR >> DCCV in ED unsuccessful; CHADS-VASc 4 >> Apixaban started for at least 1 month - ?? long term Rx  ? Varicose veins   ? ?Past Surgical History:  ?Procedure Laterality Date  ? BTL    ? CHOLECYSTECTOMY    ? DILATION AND CURETTAGE OF UTERUS    ? ERCP N/A 09/17/2019  ? Procedure: ENDOSCOPIC RETROGRADE CHOLANGIOPANCREATOGRAPHY (ERCP);  Surgeon: Ronnette Juniper, MD;  Location: [facility];  Service: Gastroenterology;  Laterality: N/A;  ? FEMORAL HERNIA REPAIR    ? High endocervical polyp removed    ? IR EMBO VENOUS NOT HEMORR HEMANG  INC GUIDE ROADMAPPING  07/20/2020  ? LIPOMA EXCISION Right 03/23/2015  ? Procedure:  EXCISION LARGE LIPOMA RIGHT UPPER ARM  ;  Surgeon: Youlanda Roys, MD;  Location: [facility];  Service: Plastics;  Laterality: Right;  ? MELANOMA EXCISION  2003  ? right calf  ? OVARIAN CYST REMOVAL    ? REMOVAL OF STONES  09/17/2019  ? Procedure: REMOVAL OF STONES;  Surgeon: Ronnette Juniper, MD;  Location: [facility];  Service: Gastroenterology;;  ? SPHINCTEROTOMY  09/17/2019  ? Procedure: SPHINCTEROTOMY;  Surgeon: Ronnette Juniper, MD;  Location: [facility] ENDOSCOPY;  Service: Gastroenterology;;  ? ?Patient Active Problem List  ? Diagnosis Date Noted  ? Atrial fibrillation with RVR ([place])   ? Anxiety and depression   ? Choledocholithiasis   ? Elevated liver enzymes   ? Hypotension   ? Osteopenia   ? Leukocytopenia 06/23/2014  ? HYPERLIPIDEMIA-MIXED 10/12/2009  ? HYPERTENSION, BENIGN 10/12/2009  ? AORTIC INSUFFICIENCY 10/12/2009  ? ANXIETY DEPRESSION 10/08/2009  ? GERD 10/08/2009  ? PMS 10/08/2009  ? Disorder of bone and cartilage 10/08/2009  ? COLONIC POLYPS, ADENOMATOUS, HX OF 10/08/2009  ? MIGRAINES, HX OF 10/08/2009  ?PCP: Shelia Media,  Thayer Jew, MD ?  ?REFERRING PROVIDER: Gregor Hams, MD ?  ?REFERRING DIAG: M25.552 (ICD-10-CM) - Left hip pain ?  ?THERAPY DIAG:  ?Pain in left hip ?  ?Muscle weakness (generalized) ?  ?ONSET DATE: 05/20/21 no MOI ?  ?SUBJECTIVE:  ?  ?SUBJECTIVE STATEMENT: ?Eval: State that she was walking a lot after last session. States that she had severe burning pain 8/10 along the right knee that night and into the next day. States she took things easy and then it felt better by Saturday.  ? ?08/04/2021 ?States that she has stiffness in her knee and would like to go over her HEP to make sure her form is good and so she has A and B days ? ?  ?PERTINENT HISTORY: ?anixeity, migraines, pseudogout,  HTN, leaky valve, arrhythmia, right knee OA and ROM limitations ?  ?PAIN:  ?Are you having pain? yes ?NPRS scale: 4/10 ?Pain location:right knee ?Pain orientation: Right,  ?PAIN TYPE: stiffness ?Pain  description: intermittent  ?Aggravating factors: AM,  ?Relieving factors: heat, walking ? 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  ?PLOF: Independent ?  ?PATIENT GOALS would like to get pain to go away ?  ?  ?OBJECTIVE:  ?          Swelling at knee joint 07/21/21 ? ?                   Left 37.75 cm Right 38.75cm ?  ?  ?MUSCLE LENGTH: ?Hamstrings: Right 30 deg; Left 20 deg ?  ?  ?PALPATION: ?tenderness to palpation in left anterior hip  ?  ?LE Measurements: ?      ?Lower Extremity Right ?08/04/2021 Left ?08/04/2021  ?  A/PROM MMT A/PROM MMT  ?Hip Flexion 110 4+ 110 5  ?Hip Extension   4   4+  ?Hip Abduction          ?Hip Adduction          ?Hip Internal rotation 30   45    ?Hip External rotation 45   55    ?Knee Flexion 126 4+ 140 4+  ?Knee Extension 8 (lacking)* 4* 0 4+  ?Ankle Dorsiflexion          ?Ankle Plantarflexion          ?Ankle Inversion          ?Ankle Eversion          ? (Blank rows = not tested) ?* = pain ?** = guarding ?  ?  ?  ?GAIT: ?Comments: reduced right TKE, antalgic gait, limited hip mobility ?  ?  ?  ?TODAY'S TREATMENT: ?08/04/2021 ?Neuromuscular re-education:  ?Review of all exercises with verbal and tactile cues for form. 10 minutes  ? ?Therapeutic Exercise: ?Hamstring stretch x3 30" Bilateral, LAQs x15 R, Review of entire program and consolidated into every day exercises, and A and B days ? ? ?06/08/21 ?Therapeutic Exercise: ?          Aerobic: ?Supine: bridge 2x10 5" holds, heel slide x10 R 5" holds ?Prone: hip extension 4x5 5" holds, quad stretch x4 60" holds R strap ?          Seated:heel slide x10 R 5" holds ?          Standing: hip flexor stretch x10 10" holds L, TKE ball 3x10 5" holds R, mini squats x15 5" holds  ? STS x20 elevated surface (25 inch height) slow  with leg press focus, LAQs x25 Bilat for ROM; STS hands on thighs 17" height chair focus on knees pressing out - x20, self mobilization to right leg.  ?  ?PATIENT EDUCATION:   ?Education:on anatomy, HEP,  on A and B days, on difference between mobility and strengthening exercises. ?Person educated: Patient ?Education method: Explanation, Demonstration, and Handouts ?Education comprehension: verbalized understanding ?  ?  ?  ?HOME EXERCISE PROGRAM: ?[id number] ?  ?ASSESSMENT: ?  ?CLINICAL IMPRESSION: ?Eval: Patient is a 71 y.o. female who was seen today for physical therapy evaluation and treatment for left hip pain. Objective impairments include Abnormal gait, decreased activity tolerance, decreased endurance, difficulty walking, decreased ROM, decreased strength, increased edema, and pain. These impairments are limiting patient from cleaning, community activity, and yard work. Personal factors including Age and Fitness are also affecting patient's functional outcome. Patient will benefit from skilled PT to address above impairments and improve overall function. ? ?08/04/2021 ?Session focused on review of exercises with form with tactile and verbal cues. Review and consolidation of all exercises into daily stretches and A and B days for strengthening exercises. Answered all questions and patient to be placed on hold secondary to patient request. If she does not return by end of April patient will be formally discharged from PT. Overall patient is progressing and would continue to benefit from skilled PT. ? ?REHAB POTENTIAL: Good ?  ?CLINICAL DECISION MAKING: Stable/uncomplicated ?  ?EVALUATION COMPLEXITY: Low ?  ?  ?GOALS: ?Goals reviewed with patient? Yes ?  ?SHORT TERM GOALS: ?  ?STG Name Target Date Goal status  ?1 Patient will be independent in self management strategies to improve quality of life and functional outcomes. ?Baseline:  07/06/2021  MET  ?2 Patient will report at least 50% improvement in overall symptoms and/or function to demonstrate improved functional mobility ?96% betting in hip ?50% better in right knee ?Baseline: 0% 07/06/2021  MET  ?3 Patient will be able to  demonstrate at least 5-130 degrees of right knee ROM ?Baseline: 07/06/2021  PROGRESSING  ?4   ?Baseline:      ?5   ?Baseline:      ?6   ?Baseline:      ?7   ?Baseline:      ?  ?LONG TERM GOALS:  ?  ?LTG Name Target Date Go

## 2021-08-11 DIAGNOSIS — I10 Essential (primary) hypertension: Secondary | ICD-10-CM | POA: Diagnosis not present

## 2021-08-11 DIAGNOSIS — I351 Nonrheumatic aortic (valve) insufficiency: Secondary | ICD-10-CM | POA: Diagnosis not present

## 2021-08-11 DIAGNOSIS — Z0000 Encounter for general adult medical examination without abnormal findings: Secondary | ICD-10-CM | POA: Diagnosis not present

## 2021-08-11 DIAGNOSIS — D72819 Decreased white blood cell count, unspecified: Secondary | ICD-10-CM | POA: Diagnosis not present

## 2021-08-11 DIAGNOSIS — R3129 Other microscopic hematuria: Secondary | ICD-10-CM | POA: Diagnosis not present

## 2021-08-12 ENCOUNTER — Ambulatory Visit (INDEPENDENT_AMBULATORY_CARE_PROVIDER_SITE_OTHER): Payer: Medicare Other

## 2021-08-12 ENCOUNTER — Ambulatory Visit: Payer: Medicare Other | Admitting: Family Medicine

## 2021-08-12 VITALS — BP 130/78 | HR 72 | Ht 69.0 in | Wt 153.8 lb

## 2021-08-12 DIAGNOSIS — M25511 Pain in right shoulder: Secondary | ICD-10-CM | POA: Diagnosis not present

## 2021-08-12 DIAGNOSIS — G8929 Other chronic pain: Secondary | ICD-10-CM

## 2021-08-12 NOTE — Patient Instructions (Addendum)
Thank you for coming in today.  ? ?Please get an Xray today before you leave  ? ?Continue physical therapy ? ?Recheck back in 6 weeks ?

## 2021-08-12 NOTE — Progress Notes (Signed)
? ?I, Peterson Lombard, LAT, ATC acting as a scribe for Lynne Leader, MD. ? ?Kristine Jones is a 71 y.o. female who presents to [place] [facility] today for f/u L hip pain and new R shoulder pain. Pt was last seen by Dr. Georgina Snell on 05/31/21 and was referred to PT, completing 14 visits. Today pt reports L hip is all better, 100% improvement. ? ?Today, pt also c/o R shoulder pain that's chronic in nature, but started bothering her more over the past 3-4 weeks.  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 Pt locates pain to superior aspect of the R shoulder. Pt notes [facility] did a steroid injection in her R shoulder Dec 2022 and dx w/ impingement and subacromial bursitis. 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  ? ?Radiates: no ?UE numbness/tingling: no ?UE weakness: no ?Aggravates: IR, flex over 90 ?Treatments tried: ice, prior steroid injection ? ?Dx imaging: 05/31/21 L hip XR ? 02/03/15 R humerus MRI  ? ?Pertinent review of systems: No fevers or chills ? ?Relevant historical information: Hypertension ? ? ?Exam:  ?BP 130/78   Pulse 72   Ht '5\' 9"'$  (1.753 m)   Wt 153 lb 12.8 oz (69.8 kg)   SpO2 98%   BMI 22.71 kg/m?  ?General: Well Developed, well nourished, and in no acute distress.  ? ?MSK: Right shoulder: Normal-appearing ?Range of motion normal abduction and external rotation internal rotation to lumbar spine. ?Strength intact. ?Positive Hawkins and Neer's test.  Positive empty can test.  Positive crossover arm compression test. ?Negative Yergason's and speeds test. ?Pulses capillary refill and sensation are intact distally. ? ? ? ?Lab and Radiology Results ? ?X-ray images right shoulder obtained today personally and independently interpreted ?No acute fractures.  No severe DJD. ?Await formal radiology review ? ? ? ? ?Assessment and Plan: ?71 y.o. female with right shoulder pain  thought to be due to impingement and bursitis and rotator cuff tendinitis.  Plan for physical therapy referral to address this issue as well.  She is done extremely well for physical therapy for her leg pain. ?Recheck back in about 6 weeks.  If not improved consider repeat injection.  She had an injection with the [facility] in December or MRI to further evaluate the source of pain. ? ? ?PDMP not reviewed this encounter. ?Orders Placed This Encounter  ?Procedures  ? DG Shoulder Right  ?  Standing Status:   Future  ?  Number of Occurrences:   1  ?  Standing Expiration Date:   08/13/2022  ?  Order Specific Question:   Reason for Exam (SYMPTOM  OR DIAGNOSIS REQUIRED)  ?  Answer:   right shoulder pain  ?  Order Specific Question:   Preferred imaging location?  ?  Answer:   [facility]  ? Ambulatory referral to Physical Therapy  ?  Referral Priority:   Routine  ?  Referral Type:   Physical Medicine  ?  Referral Reason:   Specialty Services Required  ?  Requested Specialty:   Physical Therapy  ?  Number of Visits Requested:   1  ? ?No orders of the defined types were placed in this encounter. ? ? ? ?Discussed warning signs or symptoms. Please see discharge instructions. Patient expresses understanding. ? ? ?The above documentation has been reviewed and is accurate and complete Ellard Artis  Georgina Snell, M.D. ? ? ?

## 2021-08-15 NOTE — Progress Notes (Signed)
Right shoulder x-ray shows mild arthritis changes.

## 2021-08-29 DIAGNOSIS — M810 Age-related osteoporosis without current pathological fracture: Secondary | ICD-10-CM | POA: Diagnosis not present

## 2021-08-29 NOTE — Progress Notes (Signed)
Patient ID: Kristine Jones                 DOB: June 03, 1950                      MRN: [id number] ? ? ? ? ?HPI: ?Kristine Jones is a 71 y.o. female referred by Dr. Angelena Form to HTN clinic. PMH is significant for atrial fibrillation, aortic insufficiency with possible bicuspid aortic valve, aortic regurgitation, HTN, HLD and thoracic aortic aneurysm. Pt has seen Dr. Angelena Form since 2011 and been well-controlled for BP on current regimen. Pt sent a MyChart message on 07/20/21 saying she noticed slight swelling in her ankles during rehab + a small dry cough in the morning for past couple of weeks ("it is a short cough and then over"). Thinks it could be her irbesartan causing this given she had similar side effects on lisinopril. Dr. Angelena Form said she can try holding irbesartan for a few weeks to see if cough resolves. Pt did not feel comfortable coming off her med since her BP has been great. She also asked about potentially decreasing her diltiazem dose in the future. Pt got referred to HTN clinic for BP med management.  ? ?Pt presents today for HTN clinic appointment. Pt reports med compliance and has taken her irbesartan prior to visit. Denies dizziness, lightheadedness, blurred vision, and HA. States her cough started one month ago and comes on for 1-2 mins in the morning. Kristine Jones it is hard to discern d/t the pollen. She does remember having a cough one year ago in April (08/30/20 PharmD note) and that did go away. She does not remember if she had a cough on valsartan which was switched d/t insurance. Also started noticing swelling 2 months ago in her ankles after the rehab nurse pointed it out. She has not done more walking or activity than usual to cause excess swelling. Does use compression socks, but does not notice a difference. She does not think the swelling changes whether she puts her feet up or not and that it is the same all day long. Does have a history of pseudogout which occurred after she started  triamterene/HCTZ and has 1-2 flares per year, but does not take any medication for this. Does measure her BP at home after taking her medications and reports sometimes having high BP at doctor's appointments. Pt stated she does not want to switch her irbesartan since it is working well and did ask if her med list could be condensed. ? ?Current HTN meds: irbesartan 75 mg daily, diltiazem 180 mg in the morning + 120 mg in the evening ?Previously tried: valsartan (formulary change), lisinopril (cough and swelling), triamterene/HCTZ (pseudogout), beta blockers (fatigue) ? ?Home BP: readings taken after taking medications - 114/59, 136/65, 123/64, 119/70, 113/66, 128/65, 120/63, 110/69, 103/63 ?Home BP reading in clinic: SBP 171 - 1st reading, SBP 182 - 2nd reading ?Clinic reading: 170/84 ? ?BP goal: <130/80 mmHg ? ?Family History: Father and mother - HTN ? ?Social History: Denies tobacco and illicit drug use. Reports 1 glass of wine per week ? ?Exercise: gets regular exercise (walks her dog every day, has a trainer at the [facility], floor exercise at home) ? ?Wt Readings from Last 3 Encounters:  ?08/12/21 153 lb 12.8 oz (69.8 kg)  ?05/31/21 151 lb 12.8 oz (68.9 kg)  ?09/22/20 151 lb 6.4 oz (68.7 kg)  ? ?BP Readings from Last 3 Encounters:  ?08/12/21 130/78  ?05/31/21 140/78  ?09/22/20  138/72  ? ?Pulse Readings from Last 3 Encounters:  ?08/12/21 72  ?05/31/21 70  ?09/22/20 71  ? ? ?Renal function: ?CrCl cannot be calculated (Patient's most recent lab result is older than the maximum 21 days allowed.). ? ?Past Medical History:  ?Diagnosis Date  ? Abnormal stress test   ? a. 09/2008, ST depression on ETT @ [facility], no further w/u. // Cath 8/14: no CAD  ? Adenomatous colon polyp   ? Anxiety and depression   ? Aortic insufficiency   ? a. 06/2012 Echo: EF 55-60%, Gr 1 DD, Mild AI, Triv MR, mildly dil LA, nl RV fxn. // Echo 03/2019: EF 60-65, normal RVSF, mod AI, trace MR, trivial TR, Asc Aorta 38 mm, GLS -18.1%  ? Enlarged  thoracic aorta ([place])   ? MRA 4/19: 4.1 cm // CT 09/2019: 4 cm // Chest CTA 5/22: Ascending aorta 3.8 cm   ? History of melanoma   ? Followed with Dr. Tonia Brooms  ? HTN (hypertension)   ? Hyperlipidemia   ? Leukocytosis   ? Mild  ? Migraines   ? Osteopenia   ? Paroxysmal atrial fibrillation (HCC)   ? admx in 09/2019 w/ choledocho lithiasis >> ERCP // several days later presented to ED with AF with RVR >> DCCV in ED unsuccessful; CHADS-VASc 4 >> Apixaban started for at least 1 month - ?? long term Rx  ? Varicose veins   ? ? ?Current Outpatient Medications on File Prior to Visit  ?Medication Sig Dispense Refill  ? apixaban (ELIQUIS) 5 MG TABS tablet TAKE ONE TABLET BY MOUTH TWICE A DAY 180 tablet 1  ? Calcium Carbonate-Vitamin D (CALCIUM-VITAMIN D3) 600-200 MG-UNIT TABS Take 2 tablets by mouth daily.    ? Cholecalciferol (VITAMIN D) 1000 UNITS capsule Take 1,000 Units by mouth daily.    ? diltiazem (CARDIZEM CD) 120 MG 24 hr capsule Take one tablet by mouth every evening.  Take in addition to diltiazem 180 mg every morning. 90 capsule 1  ? diltiazem (CARDIZEM CD) 180 MG 24 hr capsule TAKE ONE CAPSULE BY MOUTH DAILY 90 capsule 1  ? estradiol (ESTRACE) 0.1 MG/GM vaginal cream Place 1 Applicatorful vaginally as needed (vaginal discomfort).     ? fish oil-omega-3 fatty acids 1000 MG capsule Take 1 g by mouth daily.    ? fluticasone (FLONASE) 50 MCG/ACT nasal spray Place 1 spray into both nostrils daily as needed for allergies or rhinitis.    ? irbesartan (AVAPRO) 75 MG tablet TAKE ONE TABLET BY MOUTH DAILY 90 tablet 0  ? metoprolol tartrate (LOPRESSOR) 50 MG tablet Take one tablet 2 hours before CT scan 1 tablet 0  ? multivitamin (THERAGRAN) per tablet Take 1 tablet by mouth daily.    ? sertraline (ZOLOFT) 50 MG tablet Take 50 mg by mouth daily.    ? ?No current facility-administered medications on file prior to visit.  ? ? ?Allergies  ?Allergen Reactions  ? Indocin [Indomethacin] Other (See Comments)  ?  Stomach upset  ?  Ibuprofen Other (See Comments)  ?  Grogginess  ? Lisinopril Cough  ? Naproxen Other (See Comments)  ?  CAUSE [facility] [place] OR HAZZY  ? Naproxen Sodium Other (See Comments)  ? Penicillins Other (See Comments)  ?  "flu like symptoms"  ? Triamterene Other (See Comments)  ?  False gout  ? ?Assessment/Plan: ? ?1. Hypertension - BP elevated 170/84 in clinic, but home readings after taking her irbesartan are at goal < 130/80  mmHg. BP cuff validated today with clinic cuff. Elevated BP today seems to be d/t nervousness during visit. Discussed medication options of either switching irbesartan to valsartan or continuing irbesartan to see if cough resolves before making changes. Pt prefers to stay on irbesartan since it is controlling her BP. Discussed cough is likely more allergy related since she had a similar issue one year ago and the cough resolved, but to keep an eye on it. Told her she can also try an OTC non-drowsy antihistamine such as Allegra or Claritin to see if it helps with symptoms. ? ?Pt complained of swelling in ankles. No fluid on exam. Instructed pt to monitor swelling over next few weeks and have Dr. Angelena Form evaluate it at 09/26/21. If cough has not resolved before visit with Dr. Angelena Form, encouraged pt to switch off irbesartan to see if it resolves. ? ?Pt asked if her meds could be condensed, specifically her diltiazem. Encouraged pt to discuss with Dr. Angelena Form at appointment in May as he follows her Afib closely. Follow-up with PharmD as needed. ? ?Patient seen with Debria Garret, [place] pharmacy student ? ?Rebbeca Paul, PharmD ?PGY2 Resident ? ?Ramond Dial, Pharm.D, BCPS, CPP ?[id number], [place], [place] [address]  ?Phone: [phone]; Fax: [phone]  ? ? ? ?

## 2021-08-30 ENCOUNTER — Ambulatory Visit: Payer: Medicare Other | Admitting: Family Medicine

## 2021-08-30 ENCOUNTER — Ambulatory Visit (INDEPENDENT_AMBULATORY_CARE_PROVIDER_SITE_OTHER): Payer: Medicare Other

## 2021-08-30 ENCOUNTER — Ambulatory Visit: Payer: Self-pay

## 2021-08-30 ENCOUNTER — Ambulatory Visit: Payer: Medicare Other | Admitting: Student-PharmD

## 2021-08-30 VITALS — BP 170/84 | HR 88

## 2021-08-30 VITALS — BP 158/92 | HR 89 | Ht 69.0 in | Wt 154.2 lb

## 2021-08-30 DIAGNOSIS — I10 Essential (primary) hypertension: Secondary | ICD-10-CM

## 2021-08-30 DIAGNOSIS — M25511 Pain in right shoulder: Secondary | ICD-10-CM

## 2021-08-30 DIAGNOSIS — G8929 Other chronic pain: Secondary | ICD-10-CM

## 2021-08-30 DIAGNOSIS — M25561 Pain in right knee: Secondary | ICD-10-CM | POA: Diagnosis not present

## 2021-08-30 NOTE — Progress Notes (Signed)
? ?I, Peterson Lombard, LAT, ATC acting as a scribe for Lynne Leader, MD. ? ?Kristine Jones is a 71 y.o. female who presents to [place] at [facility] today for R shoulder pain thought to be due to impingement and bursitis and rotator cuff tendinitis. 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 Pt was last seen by Dr. Georgina Snell on 08/12/21 and was referred to PT, but doesn't have any additional visits until 4/25. Today, pt reports R shoulder is very painful. Pt had a Prolia injection yesterday. ? ?Pt also c/o R knee pain. Pt notes the pain is very severe. Pt locates pain to deep within the R knee joint. Pt used to be seen at [facility] and notes her last steroid injection in her R knee Oct 2022. ? ?Dx imaging: 08/12/21 R shoulder XR ?            02/03/15 R humerus MRI  ? ?Pertinent review of systems: No fevers or chills ? ?Relevant historical information: Hypertension ? ? ?Exam:  ?BP (!) 158/92   Pulse 89   Ht '5\' 9"'$  (1.753 m)   Wt 154 lb 3.2 oz (69.9 kg)   SpO2 97%   BMI 22.77 kg/m?  ?General: Well Developed, well nourished, and in no acute distress.  ? ?MSK: Right shoulder normal. ?Tender palpation anterior lateral upper arm. ?Range of motion abduction 130 degrees.  Internal rotation external rotation full.  Some pain with internal rotation. ?Mildly positive Hawkins and Neer's test. ?Negative Yergason's and speeds test. ?Strength 4/5 abduction 5/5 external and internal rotation. ? ?Right knee mild effusion otherwise normal-appearing ?Normal motion with mild crepitation. ?Tender palpation lateral joint line. ?Stable ligamentous exam. ?Negative Murray's test. ?Intact strength. ? ? ? ?Lab and Radiology Results ? ?Procedure: Real-time Ultrasound Guided Injection of right shoulder subacromial bursa ?Device: Philips Affiniti 50G ?Images permanently stored and available for review in PACS ?Verbal informed consent obtained.  Discussed risks and benefits of procedure.  Warned about infection, bleeding, hyperglycemia damage to structures among others. ?Patient expresses understanding and agreement ?Time-out conducted.   ?Noted no overlying erythema, induration, or other signs of local infection.   ?Skin prepped in a sterile fashion.   ?Local anesthesia: Topical Ethyl chloride.   ?With sterile technique and under real time ultrasound guidance: 40 mg of Kenalog and 2 mL of Marcaine injected into the subacromial bursa. Fluid seen entering the bursa.   ?Completed without difficulty   ?Pain immediately resolved suggesting accurate placement of the medication.   ?Advised to call if fevers/chills, erythema, induration, drainage, or persistent bleeding.   ?Images permanently stored and available for review in the ultrasound unit.  ?Impression: Technically successful ultrasound guided injection. ? ? 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  No acute fractures are present. ?Await formal radiology review ? ? ? ? ? ?Assessment and Plan: ?71 y.o. female with right shoulder pain thought to be due to subacromial impingement and rotator cuff tendinopathy/bursitis.  Plan for subacromial injection today.  She will start physical therapy shortly which should be helpful. ? ?She also has knee pain due to DJD and possibly pseudogout based on chondrocalcinosis on x-ray.  This could also be treated with intra-articular steroid injection.  However she would like to minimize side effects so we will delay the injection by a week.  She will recheck in about a week with plan for injection likely at that point.  X-ray today. ? ? ?PDMP not reviewed this encounter. ?Orders Placed This Encounter  ?Procedures  ? [place] LIMITED JOINT SPACE STRUCTURES UP RIGHT(NO LINKED CHARGES)  ?  Order Specific Question:   Reason for Exam (SYMPTOM  OR DIAGNOSIS REQUIRED)  ?  Answer:   eval shoudler  ?  Order Specific Question:    Preferred imaging location?  ?  Answer:   [place]  ? DG Knee AP/LAT W/[place] Right  ?  Standing Status:   Future  ?  Number of Occurrences:   1  ?  Standing Expiration Date:   08/31/2022  ?  Order Specific Question:   Reason for Exam (SYMPTOM  OR DIAGNOSIS REQUIRED)  ?  Answer:   eval knee pain  ?  Order Specific Question:   Preferred imaging location?  ?  Answer:   [facility]  ? ?No orders of the defined types were placed in this encounter. ? ? ? ?Discussed warning signs or symptoms. Please see discharge instructions. Patient expresses understanding. ? ? ?The above documentation has been reviewed and is accurate and complete Lynne Leader, M.D. ? ? ?

## 2021-08-30 NOTE — Patient Instructions (Addendum)
Thank you for coming in today.  ? ?Please get an Xray today before you leave  ? ?You received a steroid injection in your right shoulder today. Seek immediate medical attention if the joint becomes red, extremely painful, or is oozing fluid.  ? ?Recheck back in 1 week to get a steroid injection in your right knee. ? ? ?

## 2021-08-30 NOTE — Patient Instructions (Addendum)
Your blood pressure goal is < 130/80 ? ?Continue irbesartan 75 mg daily. Keep an eye on cough and see if it improves over the next few weeks. ? ?Can take over the counter medication like Allegra or Claritin if she is experiencing increased cough or allergy symptoms. ? ?Follow-up with Dr. Angelena Form in 1 month on 09/26/21.  ? ? ? ?

## 2021-09-01 ENCOUNTER — Encounter: Payer: Self-pay | Admitting: Family Medicine

## 2021-09-01 NOTE — Progress Notes (Signed)
Right knee x-ray shows arthritis changes and chondrocalcinosis.  Chondrocalcinosis could be because of arthritis or a condition called pseudogout. ?We can pull fluid out of the knee joint to be sure in the future if needed.

## 2021-09-05 ENCOUNTER — Encounter: Payer: Self-pay | Admitting: Family Medicine

## 2021-09-05 DIAGNOSIS — D2221 Melanocytic nevi of right ear and external auricular canal: Secondary | ICD-10-CM | POA: Diagnosis not present

## 2021-09-05 DIAGNOSIS — D220 Melanocytic nevi of lip: Secondary | ICD-10-CM | POA: Diagnosis not present

## 2021-09-06 ENCOUNTER — Ambulatory Visit: Payer: Medicare Other | Admitting: Physical Therapy

## 2021-09-06 ENCOUNTER — Ambulatory Visit: Payer: Medicare Other | Admitting: Family Medicine

## 2021-09-07 ENCOUNTER — Ambulatory Visit: Payer: Medicare Other | Admitting: Physical Therapy

## 2021-09-07 ENCOUNTER — Encounter: Payer: Self-pay | Admitting: Physical Therapy

## 2021-09-07 DIAGNOSIS — M6281 Muscle weakness (generalized): Secondary | ICD-10-CM | POA: Diagnosis not present

## 2021-09-07 DIAGNOSIS — M25611 Stiffness of right shoulder, not elsewhere classified: Secondary | ICD-10-CM | POA: Diagnosis not present

## 2021-09-07 DIAGNOSIS — G8929 Other chronic pain: Secondary | ICD-10-CM | POA: Diagnosis not present

## 2021-09-07 DIAGNOSIS — M25511 Pain in right shoulder: Secondary | ICD-10-CM | POA: Diagnosis not present

## 2021-09-07 NOTE — Therapy (Signed)
?OUTPATIENT PHYSICAL THERAPY SHOULDER EVALUATION ? ? ?Patient Name: Kristine Jones ?MRN: [id number] ?DOB:1951/01/11, 71 y.o., female ?Today's Date: 09/07/2021 ? ? PT End of Session - 09/07/21 1431   ? ? Visit Number 1   ? Number of Visits 12   ? Date for PT Re-Evaluation 10/19/21   ? Authorization Type BCBS   ? Progress Note Due on Visit 10   ? PT Start Time 1432   ? PT Stop Time 9826   ? PT Time Calculation (min) 36 min   ? ?  ?  ? ?  ? ? ?Past Medical History:  ?Diagnosis Date  ? Abnormal stress test   ? a. 09/2008, ST depression on ETT @ [facility], no further w/u. // Cath 8/14: no CAD  ? Adenomatous colon polyp   ? Anxiety and depression   ? Aortic insufficiency   ? a. 06/2012 Echo: EF 55-60%, Gr 1 DD, Mild AI, Triv MR, mildly dil LA, nl RV fxn. // Echo 03/2019: EF 60-65, normal RVSF, mod AI, trace MR, trivial TR, Asc Aorta 38 mm, GLS -18.1%  ? Enlarged thoracic aorta ([place])   ? MRA 4/19: 4.1 cm // CT 09/2019: 4 cm // Chest CTA 5/22: Ascending aorta 3.8 cm   ? History of melanoma   ? Followed with Dr. Tonia Brooms  ? HTN (hypertension)   ? Hyperlipidemia   ? Leukocytosis   ? Mild  ? Migraines   ? Osteopenia   ? Paroxysmal atrial fibrillation (HCC)   ? admx in 09/2019 w/ choledocho lithiasis >> ERCP // several days later presented to ED with AF with RVR >> DCCV in ED unsuccessful; CHADS-VASc 4 >> Apixaban started for at least 1 month - ?? long term Rx  ? Varicose veins   ? ?Past Surgical History:  ?Procedure Laterality Date  ? BTL    ? CHOLECYSTECTOMY    ? DILATION AND CURETTAGE OF UTERUS    ? ERCP N/A 09/17/2019  ? Procedure: ENDOSCOPIC RETROGRADE CHOLANGIOPANCREATOGRAPHY (ERCP);  Surgeon: Ronnette Juniper, MD;  Location: [place];  Service: Gastroenterology;  Laterality: N/A;  ? FEMORAL HERNIA REPAIR    ? High endocervical polyp removed    ? IR EMBO VENOUS NOT HEMORR HEMANG  INC GUIDE ROADMAPPING  07/20/2020  ? LIPOMA EXCISION Right 03/23/2015  ? Procedure: EXCISION LARGE LIPOMA RIGHT UPPER ARM  ;  Surgeon: Youlanda Roys, MD;  Location: [place];  Service: Plastics;  Laterality: Right;  ? MELANOMA EXCISION  2003  ? right calf  ? OVARIAN CYST REMOVAL    ? REMOVAL OF STONES  09/17/2019  ? Procedure: REMOVAL OF STONES;  Surgeon: Ronnette Juniper, MD;  Location: [place];  Service: Gastroenterology;;  ? SPHINCTEROTOMY  09/17/2019  ? Procedure: SPHINCTEROTOMY;  Surgeon: Ronnette Juniper, MD;  Location: [facility] ENDOSCOPY;  Service: Gastroenterology;;  ? ?Patient Active Problem List  ? Diagnosis Date Noted  ? Atrial fibrillation with RVR ([place])   ? Anxiety and depression   ? Choledocholithiasis   ? Elevated liver enzymes   ? Hypotension   ? Osteopenia   ? Leukocytopenia 06/23/2014  ? HYPERLIPIDEMIA-MIXED 10/12/2009  ? HYPERTENSION, BENIGN 10/12/2009  ? AORTIC INSUFFICIENCY 10/12/2009  ? ANXIETY DEPRESSION 10/08/2009  ? GERD 10/08/2009  ? PMS 10/08/2009  ? Disorder of bone and cartilage 10/08/2009  ? COLONIC POLYPS, ADENOMATOUS, HX OF 10/08/2009  ? MIGRAINES, HX OF 10/08/2009  ? ? ?PCP: Deland Pretty, MD ? ?REFERRING PROVIDER: Gregor Hams, MD ? ?  REFERRING DIAG: M25.511,G89.29 (ICD-10-CM) - Chronic right shoulder pain ? ?THERAPY DIAG:  ?Chronic right shoulder pain ? ?Muscle weakness (generalized) ? ?Decreased right shoulder range of motion ? ? ?ONSET DATE: chronic shoulder pain but recently increased in pain over the last month ? ?SUBJECTIVE:                                                                                                                                                                                     ? ?SUBJECTIVE STATEMENT: ?Reports chronic shoulder pain and had an injection in 12/22 and then saw MD this month and he did an injection on 08/30/20. States that one week a couple weeks ago she was having pain in her shoulder and knee just sitting in her chair and it hurt really bad. States that today her shoulder feels a little better. States she was supposed to get an injection in her knee yesterday but she  canceled it because it is feeling better today. States she has not returned to dance class. States she is right hand dominant. Sweeping is bothering some, lifting overhead. ?  ? ?PERTINENT HISTORY: ?anixeity, migraines, left hip and R knee pain ? ?PAIN:  ?Are you having pain? Yes: NPRS scale: 5/10 ?Pain location: right shoulder lateral side of upper arm ?Pain description: achy ?Aggravating factors: with movement, reaching behind and out to the side ?Relieving factors: ice, rest, injections ? ?PRECAUTIONS: None ? ?WEIGHT BEARING RESTRICTIONS No ? ?FALLS:  ?Has patient fallen in last 6 months? No ? 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  ? ?PLOF: Independent ? ?PATIENT GOALS wants to have less pain and movement in her shoulder.  ? ?OBJECTIVE:  ? ?DIAGNOSTIC FINDINGS:  ?Xray 08/12/21 right shoulder  ?IMPRESSION: ?No acute osseous abnormality. ?  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 ? ?COGNITION: ? Overall cognitive status: Within functional limits for tasks assessed ?    ?SENSATION: ?WFL ? ?POSTURE: ?Forward head, rounded shoulders ? ? Cervical ROM WNL - no symptoms noted in shoulders with motion. ? UE Measurements ?Upper Extremity Right ?09/07/2021 Left ?09/07/2021  ? A/PROM MMT A/PROM MMT  ?Shoulder Flexion 105* 4 160(hard endfeel) 4+  ?Shoulder Extension  5  4+  ?Shoulder Abduction 110* 4* 180 5  ?Shoulder Adduction      ?Shoulder Internal Rotation Reaches to base of contralateral shoulder blade 4-* Reaches to base of contralateral shoulder bladeReaches to base of contralateral shoulder blade  ?Reaches to base of contralateral shoulder blade 4+  ?Shoulder External Rotation Top of ipsilateral shoulder blade 3+* Top of ipsilateral shoulder blade 4+  ?Elbow Flexion  5  5  ?Elbow Extension  5  5  ?Wrist Flexion      ?Wrist Extension      ?Wrist Supination      ?Wrist Pronation      ?Wrist Ulnar Deviation      ?Wrist Radial  Deviation      ?Grip Strength NA 54# NA 48#  ?  (Blank rows = not tested) ?  * pain ?RIGHT HAND DOMINANT  ? ?  ?JOINT MOBILITY TESTING:  ?Hypomobility in right shoulder with AP ? ?PALPATION:  ?tenderness and sensitivity (guarding with right shoulder palpation along biceps, deltoid and triceps ?  ?TODAY'S TREATMENT:  ?09/07/2021 ? ?Therapeutic Exercise: ? Aerobic: ?Supine: shoulder flexion AAROM with dowel 2 minutes, shoulder ER dowel 2 minutes, self soft tissue mobilization to right Upper arm with tennis ball 2 minutes ?Prone: ? Seated: shoulder rolls x15 fwd and back, scapular retraction x15 5" holds ? Standing: ?Neuromuscular Re-education: ?Manual Therapy: ?Therapeutic Activity: ?Self Care: ?Trigger Point Dry Needling:  ?Modalities:  ? ? ? ? ?PATIENT EDUCATION:  ?Education details: on current presentation, on HEP, on clinical outcomes score and POC ?Person educated: Patient ?Education method: Explanation, Demonstration, and Handouts ?Education comprehension: verbalized understanding ? ? ?HOME EXERCISE PROGRAM: ?[id number] ? ?ASSESSMENT: ? ?CLINICAL IMPRESSION: ?Patient is a 71 y.o. female who was seen today for physical therapy evaluation and treatment for right shoulder pain. Patient presents with limited shoulder motion, strength and pain that is limiting her overall function and ability to use her right upper extremity. Patient guarded during session and required reassurance throughout session. Patient would greatly benefit from skilled PT to improve overall function and QOL. ? ? ?OBJECTIVE IMPAIRMENTS decreased activity tolerance, decreased ROM, decreased strength, impaired UE functional use, postural dysfunction, and pain.  ? ?ACTIVITY LIMITATIONS cleaning, community activity, driving, and meal prep.  ? ?PERSONAL FACTORS Age, Past/current experiences, and 1 comorbidity: right knee pain  are also affecting patient's functional outcome.  ? ? ?REHAB POTENTIAL: Good ? ?CLINICAL DECISION MAKING:  Stable/uncomplicated ? ?EVALUATION COMPLEXITY: Low ? ? ?GOALS: ?Goals reviewed with patient?  yes ? ?SHORT TERM GOALS: ? ?Patient will be independent in self management strategies to improve quality of life and functional outcomes. ?Ba

## 2021-09-12 ENCOUNTER — Other Ambulatory Visit: Payer: Self-pay | Admitting: Cardiovascular Disease

## 2021-09-12 DIAGNOSIS — F411 Generalized anxiety disorder: Secondary | ICD-10-CM | POA: Diagnosis not present

## 2021-09-12 DIAGNOSIS — M1180 Other specified crystal arthropathies, unspecified site: Secondary | ICD-10-CM | POA: Diagnosis not present

## 2021-09-12 DIAGNOSIS — M810 Age-related osteoporosis without current pathological fracture: Secondary | ICD-10-CM | POA: Diagnosis not present

## 2021-09-12 DIAGNOSIS — Z6823 Body mass index (BMI) 23.0-23.9, adult: Secondary | ICD-10-CM | POA: Diagnosis not present

## 2021-09-12 NOTE — Telephone Encounter (Signed)
Prescription refill request for Eliquis received. ?Indication: PAF ?Last office visit: 09/22/20  Estevan Ryder MD ?Scr: 0.64 on 08/30/20 ?Age: 71 ?Weight:68.7kg ? ?Based on above findings Eliquis '5mg'$  twice daily is the appropriate dose.  Refill approved. ? ?Pt has appt scheduled with Dr Angelena Form on 09/26/21.  Request labs be done at that time. ? ?

## 2021-09-19 ENCOUNTER — Ambulatory Visit: Payer: Medicare Other | Admitting: Physical Therapy

## 2021-09-20 ENCOUNTER — Encounter: Payer: Medicare Other | Admitting: Physical Therapy

## 2021-09-23 ENCOUNTER — Ambulatory Visit: Payer: Medicare Other | Admitting: Family Medicine

## 2021-09-26 ENCOUNTER — Encounter: Payer: Self-pay | Admitting: Cardiovascular Disease

## 2021-09-26 ENCOUNTER — Ambulatory Visit: Payer: Medicare Other | Admitting: Cardiovascular Disease

## 2021-09-26 VITALS — BP 160/82 | HR 68 | Ht 69.0 in | Wt 151.0 lb

## 2021-09-26 DIAGNOSIS — I712 Thoracic aortic aneurysm, without rupture, unspecified: Secondary | ICD-10-CM | POA: Diagnosis not present

## 2021-09-26 DIAGNOSIS — I351 Nonrheumatic aortic (valve) insufficiency: Secondary | ICD-10-CM | POA: Diagnosis not present

## 2021-09-26 DIAGNOSIS — I10 Essential (primary) hypertension: Secondary | ICD-10-CM | POA: Diagnosis not present

## 2021-09-26 DIAGNOSIS — I480 Paroxysmal atrial fibrillation: Secondary | ICD-10-CM

## 2021-09-26 NOTE — Progress Notes (Signed)
? ? ? ?Chief Complaint  ?Patient presents with  ? Follow-up  ?  Atrial fib ?  ? ?History of Present Illness: 71 yo female with history of atrial fibrillation, aortic insufficiency with possible bicuspid aortic valve, HTN, HLD and thoracic aortic aneurysm who is here today for cardiac follow up. Cardiac cath in August 2014 with no evidence of coronary artery disease. Echo November 2020 with LVEF=60-65%. Moderate AI. She was admitted to [facility] May 2021 with choledocholithiasis treated with ERCP, biliary sphincterotomy and balloon extraction. She was seen in the ED 09/19/19 with atrial fib with RVR. DCCV unsuccessful.  CHADS VASC score 4 (vasc dis, age x 1, female, HTN). She converted to sinus. Cardiac monitor in June 2021 with no atrial fib but short runs of SVT noted. Echo December 2022 with LVEF=65-70%, Moderate aortic insufficiency. Cardiac CTA May 2022 with calcium score zero, no evidence of CAD, no dilatation of the aortic root.  ? ?She is here today for follow up. The patient denies any chest pain, dyspnea, palpitations, lower extremity edema, orthopnea, PND, near syncope or syncope. She woke up with dizziness this morning.  ?   ?Primary Care Physician: Deland Pretty, MD ? ?Past Medical History:  ?Diagnosis Date  ? Abnormal stress test   ? a. 09/2008, ST depression on ETT @ [facility], no further w/u. // Cath 8/14: no CAD  ? Adenomatous colon polyp   ? Anxiety and depression   ? Aortic insufficiency   ? a. 06/2012 Echo: EF 55-60%, Gr 1 DD, Mild AI, Triv MR, mildly dil LA, nl RV fxn. // Echo 03/2019: EF 60-65, normal RVSF, mod AI, trace MR, trivial TR, Asc Aorta 38 mm, GLS -18.1%  ? Enlarged thoracic aorta ([place])   ? MRA 4/19: 4.1 cm // CT 09/2019: 4 cm // Chest CTA 5/22: Ascending aorta 3.8 cm   ? History of melanoma   ? Followed with Dr. Tonia Brooms  ? HTN (hypertension)   ? Hyperlipidemia   ? Leukocytosis   ? Mild  ? Migraines   ? Osteopenia   ? Paroxysmal atrial fibrillation (HCC)   ? admx in 09/2019 w/ choledocho  lithiasis >> ERCP // several days later presented to ED with AF with RVR >> DCCV in ED unsuccessful; CHADS-VASc 4 >> Apixaban started for at least 1 month - ?? long term Rx  ? Varicose veins   ? ? ?Past Surgical History:  ?Procedure Laterality Date  ? BTL    ? CHOLECYSTECTOMY    ? DILATION AND CURETTAGE OF UTERUS    ? ERCP N/A 09/17/2019  ? Procedure: ENDOSCOPIC RETROGRADE CHOLANGIOPANCREATOGRAPHY (ERCP);  Surgeon: Ronnette Juniper, MD;  Location: [place];  Service: Gastroenterology;  Laterality: N/A;  ? FEMORAL HERNIA REPAIR    ? High endocervical polyp removed    ? IR EMBO VENOUS NOT HEMORR HEMANG  INC GUIDE ROADMAPPING  07/20/2020  ? LIPOMA EXCISION Right 03/23/2015  ? Procedure: EXCISION LARGE LIPOMA RIGHT UPPER ARM  ;  Surgeon: Youlanda Roys, MD;  Location: [place];  Service: Plastics;  Laterality: Right;  ? MELANOMA EXCISION  2003  ? right calf  ? OVARIAN CYST REMOVAL    ? REMOVAL OF STONES  09/17/2019  ? Procedure: REMOVAL OF STONES;  Surgeon: Ronnette Juniper, MD;  Location: [place];  Service: Gastroenterology;;  ? SPHINCTEROTOMY  09/17/2019  ? Procedure: SPHINCTEROTOMY;  Surgeon: Ronnette Juniper, MD;  Location: [place];  Service: Gastroenterology;;  ? ? ?Current Outpatient Medications  ?Medication  Sig Dispense Refill  ? Calcium Carbonate-Vitamin D (CALCIUM-VITAMIN D3) 600-200 MG-UNIT TABS Take 2 tablets by mouth daily.    ? Cholecalciferol (VITAMIN D) 1000 UNITS capsule Take 1,000 Units by mouth daily.    ? denosumab (PROLIA) 60 MG/ML SOSY injection See admin instructions.    ? diltiazem (CARDIZEM CD) 120 MG 24 hr capsule Take one tablet by mouth every evening.  Take in addition to diltiazem 180 mg every morning. 90 capsule 1  ? diltiazem (CARDIZEM CD) 180 MG 24 hr capsule TAKE ONE CAPSULE BY MOUTH DAILY 90 capsule 1  ? ELIQUIS 5 MG TABS tablet TAKE ONE TABLET BY MOUTH TWICE A DAY 180 tablet 1  ? estradiol (ESTRACE) 0.1 MG/GM vaginal cream Place 1 Applicatorful vaginally as needed (vaginal  discomfort).     ? fish oil-omega-3 fatty acids 1000 MG capsule Take 1 g by mouth daily.    ? fluticasone (FLONASE) 50 MCG/ACT nasal spray Place 1 spray into both nostrils daily as needed for allergies or rhinitis.    ? irbesartan (AVAPRO) 75 MG tablet TAKE ONE TABLET BY MOUTH DAILY 90 tablet 0  ? multivitamin (THERAGRAN) per tablet Take 1 tablet by mouth daily.    ? sertraline (ZOLOFT) 50 MG tablet Take 50 mg by mouth daily.    ? ?No current facility-administered medications for this visit.  ? ? ?Allergies  ?Allergen Reactions  ? Indocin [Indomethacin] Other (See Comments)  ?  Stomach upset  ? Ibuprofen Other (See Comments)  ?  Grogginess  ? Lisinopril Cough  ? Naproxen Other (See Comments)  ?  CAUSE GI [place] OR HAZZY  ? Naproxen Sodium Other (See Comments)  ? Penicillins Other (See Comments)  ?  "flu like symptoms"  ? Triamterene Other (See Comments)  ?  False gout  ? ? ?Social History  ? ?Socioeconomic History  ? Marital status: Married  ?  Spouse name: Not on file  ? Number of children: Not on file  ? Years of education: Not on file  ? Highest education level: Not on file  ?Occupational History  ? Occupation: Futures trader  ?  Comment: [employer]  ?Tobacco Use  ? Smoking status: Never  ? Smokeless tobacco: Never  ?Vaping Use  ? Vaping Use: Never used  ?Substance and Sexual Activity  ? Alcohol use: Yes  ?  Alcohol/week: 1.0 standard drink  ?  Types: 1 Standard drinks or equivalent per week  ?  Comment: 1 glass of wine per week  ? Drug use: No  ? Sexual activity: Not on file  ?Other Topics Concern  ? Not on file  ?Social History Narrative  ? Married  ? 1 daughter  ? Gets regular exercise: Walking, yoga, weight lifting  ? ?Social Determinants of Health  ? ?Financial Resource Strain: Not on file  ?Food Insecurity: Not on file  ?Transportation Needs: Not on file  ?Physical Activity: Not on file  ?Stress: Not on file  ?Social Connections: Not on file  ?Intimate Partner Violence: Not on file  ? ? ?Family  History  ?Problem Relation Age of Onset  ? Hypertension Mother   ? Migraines Mother   ? Hypertension Father   ? Lung cancer Father   ? ? ?Review of Systems:  As stated in the HPI and otherwise negative.  ? ?BP (!) 160/82   Pulse 68   Ht '5\' 9"'$  (1.753 m)   Wt 151 lb (68.5 kg)   SpO2 98%   BMI 22.30 kg/m?  ? ?  Physical Examination: ?General: Well developed, well nourished, NAD  ?HEENT: OP clear, mucus membranes moist  ?SKIN: warm, dry. No rashes. ?Neuro: No focal deficits  ?Musculoskeletal: Muscle strength 5/5 all ext  ?Psychiatric: Mood and affect normal  ?Neck: No JVD, no carotid bruits, no thyromegaly, no lymphadenopathy.  ?Lungs:Clear bilaterally, no wheezes, rhonci, crackles ?Cardiovascular: Regular rate and rhythm. No murmurs, gallops or rubs. ?Abdomen:Soft. Bowel sounds present. Non-tender.  ?Extremities: No lower extremity edema. Pulses are 2 + in the bilateral DP/PT. ? ?Echo December 2022: ? 1. Left ventricular ejection fraction, by estimation, is 65 to 70%. The  ?left ventricle has normal function. The left ventricle has no regional  ?wall motion abnormalities. Left ventricular diastolic parameters were  ?normal. The average left ventricular  ?global longitudinal strain is -22.4 %. The global longitudinal strain is  ?normal.  ? 2. Right ventricular systolic function is normal. The right ventricular  ?size is normal.  ? 3. The mitral valve is normal in structure. Trivial mitral valve  ?regurgitation.  ? 4. The aortic valve is tricuspid. There is mild calcification of the  ?aortic valve. There is mild thickening of the aortic valve. Aortic valve  ?regurgitation is moderate. Aortic valve sclerosis/calcification is  ?present, without any evidence of aortic  ?stenosis.  ? 5. Aortic dilatation noted. There is mild dilatation of the ascending  ?aorta, measuring 39 mm.  ? 6. The inferior vena cava is normal in size with greater than 50%  ?respiratory variability, suggesting right atrial pressure of 3 mmHg.   ? ?Cardiac cath 01/03/13: ?1. No angiographic evidence of CAD  ?2. Normal LV systolic function ? ?Cardiac CTA May 2022: ?No evidence of CAD ?Calcium score zero ?Mid ascending aorta 3.9 cm ? ?EKG:  EKG is  ordered

## 2021-09-26 NOTE — Patient Instructions (Signed)
Medication Instructions:  No changes *If you need a refill on your cardiac medications before your next appointment, please call your pharmacy*   Lab Work: none If you have labs (blood work) drawn today and your tests are completely normal, you will receive your results only by: MyChart Message (if you have MyChart) OR A paper copy in the mail If you have any lab test that is abnormal or we need to change your treatment, we will call you to review the results.   Testing/Procedures: none   Follow-Up: At [facility], you and your health needs are our priority.  As part of our continuing mission to provide you with exceptional heart care, we have created designated Provider Care Teams.  These Care Teams include your primary Cardiologist (physician) and Advanced Practice Providers (APPs -  Physician Assistants and Nurse Practitioners) who all work together to provide you with the care you need, when you need it.  We recommend signing up for the patient portal called "MyChart".  Sign up information is provided on this After Visit Summary.  MyChart is used to connect with patients for Virtual Visits (Telemedicine).  Patients are able to view lab/test results, encounter notes, upcoming appointments, etc.  Non-urgent messages can be sent to your provider as well.   To learn more about what you can do with MyChart, go to [email or web address].    Your next appointment:   12 month(s)  The format for your next appointment:   In Person  Provider:   Christopher McAlhany, MD     Other Instructions   Important Information About Sugar       

## 2021-09-27 ENCOUNTER — Encounter: Payer: Self-pay | Admitting: Physical Therapy

## 2021-09-27 ENCOUNTER — Ambulatory Visit: Payer: Medicare Other | Admitting: Physical Therapy

## 2021-09-27 ENCOUNTER — Encounter: Payer: Medicare Other | Admitting: Physical Therapy

## 2021-09-27 DIAGNOSIS — M25511 Pain in right shoulder: Secondary | ICD-10-CM

## 2021-09-27 DIAGNOSIS — M6281 Muscle weakness (generalized): Secondary | ICD-10-CM

## 2021-09-27 DIAGNOSIS — M25611 Stiffness of right shoulder, not elsewhere classified: Secondary | ICD-10-CM | POA: Diagnosis not present

## 2021-09-27 DIAGNOSIS — G8929 Other chronic pain: Secondary | ICD-10-CM

## 2021-09-27 DIAGNOSIS — M25561 Pain in right knee: Secondary | ICD-10-CM

## 2021-09-27 NOTE — Therapy (Signed)
?OUTPATIENT PHYSICAL THERAPY TREATMENT NOTE ? ? ?Patient Name: Kristine Jones ?MRN: [id number] ?DOB:06/05/1950, 71 y.o., female ?Today's Date: 09/27/2021 ? ? ?END OF SESSION:  ? PT End of Session - 09/27/21 1102   ? ? Visit Number 2   ? Number of Visits 12   ? Date for PT Re-Evaluation 10/19/21   ? Authorization Type BCBS   ? Progress Note Due on Visit 10   ? PT Start Time 1102   ? PT Stop Time 1141   ? PT Time Calculation (min) 39 min   ? ?  ?  ? ?  ? ? ?Past Medical History:  ?Diagnosis Date  ? Abnormal stress test   ? a. 09/2008, ST depression on ETT @ [facility], no further w/u. // Cath 8/14: no CAD  ? Adenomatous colon polyp   ? Anxiety and depression   ? Aortic insufficiency   ? a. 06/2012 Echo: EF 55-60%, Gr 1 DD, Mild AI, Triv MR, mildly dil LA, nl RV fxn. // Echo 03/2019: EF 60-65, normal RVSF, mod AI, trace MR, trivial TR, Asc Aorta 38 mm, GLS -18.1%  ? Enlarged thoracic aorta ([place])   ? MRA 4/19: 4.1 cm // CT 09/2019: 4 cm // Chest CTA 5/22: Ascending aorta 3.8 cm   ? History of melanoma   ? Followed with Dr. Tonia Brooms  ? HTN (hypertension)   ? Hyperlipidemia   ? Leukocytosis   ? Mild  ? Migraines   ? Osteopenia   ? Paroxysmal atrial fibrillation (HCC)   ? admx in 09/2019 w/ choledocho lithiasis >> ERCP // several days later presented to ED with AF with RVR >> DCCV in ED unsuccessful; CHADS-VASc 4 >> Apixaban started for at least 1 month - ?? long term Rx  ? Varicose veins   ? ?Past Surgical History:  ?Procedure Laterality Date  ? BTL    ? CHOLECYSTECTOMY    ? DILATION AND CURETTAGE OF UTERUS    ? ERCP N/A 09/17/2019  ? Procedure: ENDOSCOPIC RETROGRADE CHOLANGIOPANCREATOGRAPHY (ERCP);  Surgeon: Ronnette Juniper, MD;  Location: [place];  Service: Gastroenterology;  Laterality: N/A;  ? FEMORAL HERNIA REPAIR    ? High endocervical polyp removed    ? IR EMBO VENOUS NOT HEMORR HEMANG  INC GUIDE ROADMAPPING  07/20/2020  ? LIPOMA EXCISION Right 03/23/2015  ? Procedure: EXCISION LARGE LIPOMA RIGHT UPPER ARM  ;  Surgeon:  Youlanda Roys, MD;  Location: [place];  Service: Plastics;  Laterality: Right;  ? MELANOMA EXCISION  2003  ? right calf  ? OVARIAN CYST REMOVAL    ? REMOVAL OF STONES  09/17/2019  ? Procedure: REMOVAL OF STONES;  Surgeon: Ronnette Juniper, MD;  Location: [place];  Service: Gastroenterology;;  ? SPHINCTEROTOMY  09/17/2019  ? Procedure: SPHINCTEROTOMY;  Surgeon: Ronnette Juniper, MD;  Location: [facility] ENDOSCOPY;  Service: Gastroenterology;;  ? ?Patient Active Problem List  ? Diagnosis Date Noted  ? Atrial fibrillation with RVR ([place])   ? Anxiety and depression   ? Choledocholithiasis   ? Elevated liver enzymes   ? Hypotension   ? Osteopenia   ? Leukocytopenia 06/23/2014  ? HYPERLIPIDEMIA-MIXED 10/12/2009  ? HYPERTENSION, BENIGN 10/12/2009  ? AORTIC INSUFFICIENCY 10/12/2009  ? ANXIETY DEPRESSION 10/08/2009  ? GERD 10/08/2009  ? PMS 10/08/2009  ? Disorder of bone and cartilage 10/08/2009  ? COLONIC POLYPS, ADENOMATOUS, HX OF 10/08/2009  ? MIGRAINES, HX OF 10/08/2009  ? ?PCP: Deland Pretty, MD ?  ?REFERRING PROVIDER:  Gregor Hams, MD ?  ?REFERRING DIAG: M25.511,G89.29 (ICD-10-CM) - Chronic right shoulder pain, previous d of left hip pain ?  ?THERAPY DIAG:  ?Chronic right shoulder pain ?  ?Muscle weakness (generalized) ?  ?Decreased right shoulder range of motion ?  ?  ?ONSET DATE: chronic shoulder pain but recently increased in pain over the last month ?  ?SUBJECTIVE:                                                                                                                                                                                     ?  ?SUBJECTIVE STATEMENT: ?09/27/2021 ?States that today her shoulder is about 5/10 and is feeling better. States she is real careful with it. States she would like also continue therapy with her knee. States her trainer is frustrated as she can't do much.  ? ?Eval: Reports chronic shoulder pain and had an injection in 12/22 and then saw MD this month and he did an  injection on 08/30/20. States that one week a couple weeks ago she was having pain in her shoulder and knee just sitting in her chair and it hurt really bad. States that today her shoulder feels a little better. States she was supposed to get an injection in her knee yesterday but she canceled it because it is feeling better today. States she has not returned to dance class. States she is right hand dominant. Sweeping is bothering some, lifting overhead. ?  ?  ?PERTINENT HISTORY: ?anixeity, migraines, left hip and R knee pain ?  ?PAIN:  ?Are you having pain? Yes: NPRS scale: 5/10 ?Pain location: right shoulder lateral side of upper arm ?Pain description: achy ?Aggravating factors: with movement, reaching behind and out to the side ?Relieving factors: ice, rest, injections ?  ?PRECAUTIONS: None ?  ?WEIGHT BEARING RESTRICTIONS No ?  ?FALLS:  ?Has patient fallen in last 6 months? No ?  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  ?  ?PLOF: Independent ?  ?PATIENT GOALS wants to have less pain and movement in her shoulder.  ?  ?OBJECTIVE:  ?  ?DIAGNOSTIC FINDINGS:  ?Xray 08/12/21 right shoulder  ?IMPRESSION: ?No acute osseous abnormality. ?  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 ?  ?COGNITION: ?          Overall cognitive status: Within functional limits for tasks assessed ?                                 ?SENSATION: ?WFL ?  ?POSTURE: ?Forward head, rounded  shoulders ?  ?           Cervical ROM WNL - no symptoms noted in shoulders with motion. ?           UE Measurements ?      ?Upper Extremity Right ?09/07/2021 Left ?09/07/2021  ?  A/PROM MMT A/PROM MMT  ?Shoulder Flexion 105* 4 160(hard endfeel) 4+  ?Shoulder Extension   5   4+  ?Shoulder Abduction 110* 4* 180 5  ?Shoulder Adduction          ?Shoulder Internal Rotation Reaches to base of contralateral shoulder blade 4-*   ?Reaches to base of contralateral shoulder blade 4+  ?Shoulder  External Rotation Top of ipsilateral shoulder blade 3+* Top of ipsilateral shoulder blade 4+  ?Elbow Flexion   5   5  ?Elbow Extension   5   5  ?Wrist Flexion          ?Wrist Extension          ?Wrist Supination          ?Wrist Pronation          ?Wrist Ulnar Deviation          ?Wrist Radial Deviation          ?Grip Strength NA 54# NA 48#  ?                      (Blank rows = not tested) ?                      * pain ?RIGHT HAND DOMINANT  ?  ? ? ? ? LE Measurements ?Lower Extremity Right ?09/27/2021 Left ?09/27/2021  ? A/PROM MMT A/PROM MMT  ?Hip Flexion  4-  4  ?Hip Extension      ?Hip Abduction      ?Hip Adduction      ?Hip Internal rotation      ?Hip External rotation      ?Knee Flexion 124 4- 125 4  ?Knee Extension Lacking 6 4-* Lacking 2 4+  ?Ankle Dorsiflexion  4  4+  ?Ankle Plantarflexion      ?Ankle Inversion      ?Ankle Eversion      ? (Blank rows = not tested) ? * pain ? ?JOINT MOBILITY TESTING:  ?Hypomobility in right shoulder with AP ?  ?PALPATION:  ?tenderness and sensitivity (guarding with right shoulder palpation along biceps, deltoid and triceps ?            ?TODAY'S TREATMENT:  ?09/27/2021 ?Therapeutic Exercise: ?           Aerobic: ?Supine: SLR 4x5 R with quad set, knee flexion/extension supine between sets ?Prone:  ?           Seated:  tiger tail to right leg 5 minutes ?           Standing:shoulder flexion at wall slide 2 minutes  ?Neuromuscular Re-education: ?Manual Therapy: ?Therapeutic Activity: ?Self Care: ?Trigger Point Dry Needling:  ?Modalities:  ?  ?  ?  ?  ?PATIENT EDUCATION:  ?Education details: on current presentation, on knee presentation, on HEP, on exercises to perform in gym and how. On anatomy and rationale for current symptoms ?Person educated: Patient ?Education method: Explanation, Demonstration, and Handouts ?Education comprehension: verbalized understanding ?  ?  ?HOME EXERCISE PROGRAM: ?[id number] ?  ?ASSESSMENT: ?  ?CLINICAL IMPRESSION: ?09/27/2021 ?Added in knee to POC per  patient request and currently  being treated by same doctor for left hip and right knee pain. Overall knee is doing better then previous episode of care for left hip/right knee. Answered all questions and revi

## 2021-10-03 ENCOUNTER — Other Ambulatory Visit: Payer: Self-pay

## 2021-10-03 MED ORDER — DILTIAZEM HCL ER COATED BEADS 180 MG PO CP24: 180.0000 mg | ORAL_CAPSULE | Freq: Every day | ORAL | 3 refills | Status: DC

## 2021-10-05 ENCOUNTER — Encounter: Payer: Medicare Other | Admitting: Physical Therapy

## 2021-10-11 ENCOUNTER — Ambulatory Visit: Payer: Medicare Other | Admitting: Physical Therapy

## 2021-10-11 ENCOUNTER — Encounter: Payer: Self-pay | Admitting: Physical Therapy

## 2021-10-11 DIAGNOSIS — M25511 Pain in right shoulder: Secondary | ICD-10-CM

## 2021-10-11 DIAGNOSIS — M25561 Pain in right knee: Secondary | ICD-10-CM | POA: Diagnosis not present

## 2021-10-11 DIAGNOSIS — M6281 Muscle weakness (generalized): Secondary | ICD-10-CM | POA: Diagnosis not present

## 2021-10-11 DIAGNOSIS — M25611 Stiffness of right shoulder, not elsewhere classified: Secondary | ICD-10-CM

## 2021-10-11 DIAGNOSIS — G8929 Other chronic pain: Secondary | ICD-10-CM

## 2021-10-11 NOTE — Therapy (Signed)
OUTPATIENT PHYSICAL THERAPY TREATMENT NOTE   Patient Name: Kristine Jones MRN: [id number] DOB:Feb 10, 1951, 71 y.o., female Today's Date: 10/11/2021   END OF SESSION:   PT End of Session - 10/11/21 1116     Visit Number 3    Number of Visits 12    Date for PT Re-Evaluation 10/19/21    Authorization Type BCBS    Progress Note Due on Visit 10    PT Start Time 1102    PT Stop Time 1140    PT Time Calculation (min) 38 min              Past Medical History:  Diagnosis Date   Abnormal stress test    a. 09/2008, ST depression on ETT @ [facility], no further w/u. // Cath 8/14: no CAD   Adenomatous colon polyp    Anxiety and depression    Aortic insufficiency    a. 06/2012 Echo: EF 55-60%, Gr 1 DD, Mild AI, Triv MR, mildly dil LA, nl RV fxn. // Echo 03/2019: EF 60-65, normal RVSF, mod AI, trace MR, trivial TR, Asc Aorta 38 mm, GLS -18.1%   Enlarged thoracic aorta ([place])    MRA 4/19: 4.1 cm // CT 09/2019: 4 cm // Chest CTA 5/22: Ascending aorta 3.8 cm    History of melanoma    Followed with Dr. Tonia Brooms   HTN (hypertension)    Hyperlipidemia    Leukocytosis    Mild   Migraines    Osteopenia    Paroxysmal atrial fibrillation ([place])    admx in 09/2019 w/ choledocho lithiasis >> ERCP // several days later presented to ED with AF with RVR >> DCCV in ED unsuccessful; CHADS-VASc 4 >> Apixaban started for at least 1 month - ?? long term Rx   Varicose veins    Past Surgical History:  Procedure Laterality Date   BTL     CHOLECYSTECTOMY     DILATION AND CURETTAGE OF UTERUS     ERCP N/A 09/17/2019   Procedure: ENDOSCOPIC RETROGRADE CHOLANGIOPANCREATOGRAPHY (ERCP);  Surgeon: Ronnette Juniper, MD;  Location: [place];  Service: Gastroenterology;  Laterality: N/A;   FEMORAL HERNIA REPAIR     High endocervical polyp removed     IR EMBO VENOUS NOT HEMORR HEMANG  INC GUIDE ROADMAPPING  07/20/2020   LIPOMA EXCISION Right 03/23/2015   Procedure: EXCISION LARGE LIPOMA RIGHT UPPER ARM  ;   Surgeon: Youlanda Roys, MD;  Location: [place];  Service: Plastics;  Laterality: Right;   MELANOMA EXCISION  2003   right calf   OVARIAN CYST REMOVAL     REMOVAL OF STONES  09/17/2019   Procedure: REMOVAL OF STONES;  Surgeon: Ronnette Juniper, MD;  Location: [facility] ENDOSCOPY;  Service: Gastroenterology;;   [name]  09/17/2019   Procedure: SPHINCTEROTOMY;  Surgeon: Ronnette Juniper, MD;  Location: [facility] ENDOSCOPY;  Service: Gastroenterology;;   Patient Active Problem List   Diagnosis Date Noted   Atrial fibrillation with RVR ([place])    Anxiety and depression    Choledocholithiasis    Elevated liver enzymes    Hypotension    Osteopenia    Leukocytopenia 06/23/2014   HYPERLIPIDEMIA-MIXED 10/12/2009   HYPERTENSION, BENIGN 10/12/2009   AORTIC INSUFFICIENCY 10/12/2009   ANXIETY DEPRESSION 10/08/2009   GERD 10/08/2009   PMS 10/08/2009   Disorder of bone and cartilage 10/08/2009   COLONIC POLYPS, ADENOMATOUS, HX OF 10/08/2009   MIGRAINES, HX OF 10/08/2009   PCP: Deland Pretty, MD   REFERRING  PROVIDER: Gregor Hams, MD   REFERRING DIAG: [phone] (ICD-10-CM) - Chronic right shoulder pain, previous d of left hip pain   THERAPY DIAG:  Chronic right shoulder pain   Muscle weakness (generalized)   Decreased right shoulder range of motion     ONSET DATE: chronic shoulder pain but recently increased in pain over the last month   SUBJECTIVE:                                                                                                                                                                                       SUBJECTIVE STATEMENT: 10/11/2021 States that her knee is feeling great. States that she did a lot on Tuesday last week and later that night she had excruciated pain occurred and she couldn't walk or stand up ad felt like her knee wouldn't work and it was greatly improved the next morning. States that she did a lot of yard work and walked the dog.    Eval: Reports chronic shoulder pain and had an injection in 12/22 and then saw MD this month and he did an injection on 08/30/20. States that one week a couple weeks ago she was having pain in her shoulder and knee just sitting in her chair and it hurt really bad. States that today her shoulder feels a little better. States she was supposed to get an injection in her knee yesterday but she canceled it because it is feeling better today. States she has not returned to dance class. States she is right hand dominant. Sweeping is bothering some, lifting overhead.     PERTINENT HISTORY: anixeity, migraines, left hip and R knee pain   PAIN:  Are you having pain? Yes: NPRS scale: 5/10  Pain location: in the shoulder and knee  Pain description: soreness Aggravating factors: with movement, reaching behind and out to the side Relieving factors: ice, rest, injections   PRECAUTIONS: None   WEIGHT BEARING RESTRICTIONS No   FALLS:  Has patient fallen in last 6 months? 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    OBJECTIVE:    DIAGNOSTIC FINDINGS:  Xray 08/12/21 right shoulder  IMPRESSION: No acute osseous abnormality.   Mild osteoarthritis of the glenohumeral and acromioclavicular joints.   COGNITION:           Overall cognitive status: Within functional limits for tasks assessed  SENSATION: WFL   POSTURE: Forward head, rounded shoulders              Cervical ROM WNL - no symptoms noted in shoulders with motion.            UE Measurements       Upper Extremity Right 09/07/2021 Left 09/07/2021    A/PROM MMT A/PROM MMT  Shoulder Flexion 105* 4 160(hard endfeel) 4+  Shoulder Extension   5   4+  Shoulder Abduction 110* 4* 180 5  Shoulder Adduction          Shoulder Internal Rotation Reaches to base  of contralateral shoulder blade 4-*   Reaches to base of contralateral shoulder blade 4+  Shoulder External Rotation Top of ipsilateral shoulder blade 3+* Top of ipsilateral shoulder blade 4+  Elbow Flexion   5   5  Elbow Extension   5   5  Wrist Flexion          Wrist Extension          Wrist Supination          Wrist Pronation          Wrist Ulnar Deviation          Wrist Radial Deviation          Grip Strength NA 54# NA 48#                        (Blank rows = not tested)                       * pain RIGHT HAND DOMINANT        LE Measurements Lower Extremity Right 10/11/2021 Left 10/11/2021   A/PROM MMT A/PROM MMT  Hip Flexion  4-  4  Hip Extension      Hip Abduction      Hip Adduction      Hip Internal rotation      Hip External rotation      Knee Flexion 124 4- 125 4  Knee Extension Lacking 6 4-* Lacking 2 4+  Ankle Dorsiflexion  4  4+  Ankle Plantarflexion      Ankle Inversion      Ankle Eversion       (Blank rows = not tested)  * pain  JOINT MOBILITY TESTING:  Hypomobility in right shoulder with AP   PALPATION:  tenderness and sensitivity (guarding with right shoulder palpation along biceps, deltoid and triceps             TODAY'S TREATMENT:  10/11/2021 Therapeutic Exercise:            Aerobic: Supine: SLR 4x5 R with quad set, knee flexion/extension supine between sets Quad: rock backs x2 1 minutes            Seated: LAQs 2x15 5"R, tiger tail to R leg 5 minutes            Standing:staggered stance with focus on TKE x6 15" holds, hamstring curls 2x15 R, stairs 4" steps x2 of 10 bilateral, foot taps on step 4x5 bilateral with 2 finger support  Neuromuscular Re-education: Manual Therapy: Therapeutic Activity: Self Care: Trigger Point Dry Needling:  Modalities:          PATIENT EDUCATION:  Education details: on standing posture, on how to reduce stress on back and knees with bo or scooter for gardening. Person educated: Patient Education method:  Explanation, Demonstration,  and Handouts Education comprehension: verbalized understanding     HOME EXERCISE PROGRAM: [id number]   ASSESSMENT:   CLINICAL IMPRESSION: 10/11/2021 Overall tolerated session well. Able to do semi SLS today without pain, just awareness of right knee. Educated patient on continued sustained posutres in standing and goal moving forward. Will continue with current POC.   Eval: Patient is a 71 y.o. female who was seen today for physical therapy evaluation and treatment for right shoulder pain. Patient presents with limited shoulder motion, strength and pain that is limiting her overall function and ability to use her right upper extremity. Patient guarded during session and required reassurance throughout session. Patient would greatly benefit from skilled PT to improve overall function and QOL.     OBJECTIVE IMPAIRMENTS decreased activity tolerance, decreased ROM, decreased strength, impaired UE functional use, postural dysfunction, and pain.    ACTIVITY LIMITATIONS cleaning, community activity, driving, and meal prep.    PERSONAL FACTORS Age, Past/current experiences, and 1 comorbidity: right knee pain  are also affecting patient's functional outcome.      REHAB POTENTIAL: Good   CLINICAL DECISION MAKING: Stable/uncomplicated   EVALUATION COMPLEXITY: Low     GOALS: Goals reviewed with patient?  yes   SHORT TERM GOALS:   Patient will be independent in self management strategies to improve quality of life and functional outcomes. Baseline: new program Target date: 09/28/2021 Goal status: INITIAL   2.  Patient will report at least 50% improvement in overall symptoms and/or function to demonstrate improved functional mobility Baseline: 0% Target date: 09/28/2021 Goal status: INITIAL   3.  Patient will be able to demonstrate at least 125 degrees of right shoulder flexion to improve overhead reaching. Baseline: unable Target date: 09/28/2021 Goal status:  INITIAL         LONG TERM GOALS:   Patient will report at least 75% improvement in overall symptoms and/or function to demonstrate improved functional mobility Baseline: 0% Target date: 10/19/2021 Goal status: INITIAL   2.  Patient will be able to demonstrate painfree shoulder ROM in all direction to improve ability to use right UE Baseline: unable Target date: 10/19/2021 Goal status: INITIAL   3.  Patient will be able to lift 3 pound weight to shelf at head height with both arm to improve ability to put dishes away. Baseline: unable Target date: 10/19/2021 Goal status: INITIAL           PLAN: PT FREQUENCY: 2x/week   PT DURATION: 6 weeks   PLANNED INTERVENTIONS: Therapeutic exercises, Therapeutic activity, Neuromuscular re-education, Balance training, Gait training, Patient/Family education, Joint manipulation, Joint mobilization, Dry Needling, Electrical stimulation, Cryotherapy, Moist heat, Traction, Ultrasound, Ionotophoresis '4mg'$ /ml Dexamethasone, and Manual therapy   PLAN FOR NEXT SESSION: SELF STM, shoulder ROM, isometrics, strengthening as toelrated  12:10 PM, 10/11/21 Jerene Pitch, DPT Physical Therapy with [facility]

## 2021-10-14 DIAGNOSIS — Z124 Encounter for screening for malignant neoplasm of cervix: Secondary | ICD-10-CM | POA: Diagnosis not present

## 2021-10-14 DIAGNOSIS — Z01411 Encounter for gynecological examination (general) (routine) with abnormal findings: Secondary | ICD-10-CM | POA: Diagnosis not present

## 2021-10-14 DIAGNOSIS — R3915 Urgency of urination: Secondary | ICD-10-CM | POA: Diagnosis not present

## 2021-10-14 DIAGNOSIS — Z01419 Encounter for gynecological examination (general) (routine) without abnormal findings: Secondary | ICD-10-CM | POA: Diagnosis not present

## 2021-10-14 DIAGNOSIS — Z0142 Encounter for cervical smear to confirm findings of recent normal smear following initial abnormal smear: Secondary | ICD-10-CM | POA: Diagnosis not present

## 2021-10-14 DIAGNOSIS — N952 Postmenopausal atrophic vaginitis: Secondary | ICD-10-CM | POA: Diagnosis not present

## 2021-10-14 DIAGNOSIS — M8580 Other specified disorders of bone density and structure, unspecified site: Secondary | ICD-10-CM | POA: Diagnosis not present

## 2021-10-18 ENCOUNTER — Encounter: Payer: Medicare Other | Admitting: Physical Therapy

## 2021-10-20 DIAGNOSIS — L603 Nail dystrophy: Secondary | ICD-10-CM | POA: Diagnosis not present

## 2021-10-20 DIAGNOSIS — B351 Tinea unguium: Secondary | ICD-10-CM | POA: Diagnosis not present

## 2021-10-20 DIAGNOSIS — M2011 Hallux valgus (acquired), right foot: Secondary | ICD-10-CM | POA: Diagnosis not present

## 2021-10-20 DIAGNOSIS — L565 Disseminated superficial actinic porokeratosis (DSAP): Secondary | ICD-10-CM | POA: Diagnosis not present

## 2021-10-20 DIAGNOSIS — M2012 Hallux valgus (acquired), left foot: Secondary | ICD-10-CM | POA: Diagnosis not present

## 2021-10-26 DIAGNOSIS — L608 Other nail disorders: Secondary | ICD-10-CM | POA: Diagnosis not present

## 2021-10-26 DIAGNOSIS — L603 Nail dystrophy: Secondary | ICD-10-CM | POA: Diagnosis not present

## 2021-10-31 ENCOUNTER — Other Ambulatory Visit: Payer: Self-pay | Admitting: Cardiovascular Disease

## 2021-11-14 DIAGNOSIS — M21622 Bunionette of left foot: Secondary | ICD-10-CM | POA: Diagnosis not present

## 2021-11-14 DIAGNOSIS — M2012 Hallux valgus (acquired), left foot: Secondary | ICD-10-CM | POA: Diagnosis not present

## 2021-11-14 DIAGNOSIS — M2041 Other hammer toe(s) (acquired), right foot: Secondary | ICD-10-CM | POA: Diagnosis not present

## 2021-11-14 DIAGNOSIS — M21621 Bunionette of right foot: Secondary | ICD-10-CM | POA: Diagnosis not present

## 2021-11-27 ENCOUNTER — Other Ambulatory Visit: Payer: Self-pay | Admitting: Cardiovascular Disease

## 2021-11-30 DIAGNOSIS — H524 Presbyopia: Secondary | ICD-10-CM | POA: Diagnosis not present

## 2021-12-05 ENCOUNTER — Encounter: Payer: Self-pay | Admitting: Cardiovascular Disease

## 2021-12-07 ENCOUNTER — Ambulatory Visit: Payer: Medicare Other | Admitting: Family Medicine

## 2021-12-07 ENCOUNTER — Ambulatory Visit: Payer: Self-pay

## 2021-12-07 VITALS — BP 142/80 | HR 72 | Ht 69.0 in | Wt 157.0 lb

## 2021-12-07 DIAGNOSIS — M1711 Unilateral primary osteoarthritis, right knee: Secondary | ICD-10-CM

## 2021-12-07 DIAGNOSIS — M25561 Pain in right knee: Secondary | ICD-10-CM

## 2021-12-07 DIAGNOSIS — G8929 Other chronic pain: Secondary | ICD-10-CM | POA: Diagnosis not present

## 2021-12-07 NOTE — Patient Instructions (Addendum)
Thank you for coming in today.   You received an injection today. Seek immediate medical attention if the joint becomes red, extremely painful, or is oozing fluid.   Limit knee flexion on the leg press to no greater than 70 degrees.  Check back as needed

## 2021-12-07 NOTE — Progress Notes (Unsigned)
I, Peterson Lombard, LAT, ATC acting as a scribe for Lynne Leader, MD.  Kristine Jones is a 70 y.o. female who presents to [place] [facility] today for f/u R knee pain. Pt used to be seen at [facility] and notes her last steroid injection in her R knee Oct 2022. Pt was last seen by Dr. Georgina Snell on 08/30/21 for chronic R shoulder and R knee pain and was given a R subacromial steroid injection and was referred to PT. Today, pt reports her knee pain had resolved after her prior visit. Pt notes R knee pain has worsened over the last couple weeks. Pt notes pain will vary.  Dx imaging: 08/30/21 R knee XR  Pertinent review of systems: No fevers or chills  Relevant historical information: Hypertension   Exam:  BP (!) 142/80   Pulse 72   Ht '5\' 9"'$  (1.753 m)   Wt 157 lb (71.2 kg)   SpO2 98%   BMI 23.18 kg/m  General: Well Developed, well nourished, and in no acute distress.   MSK: Right knee: Mild effusion normal motion with crepitation.  Tender palpation medial joint line.    Lab and Radiology Results  Procedure: Real-time Ultrasound Guided Injection of right knee superior lateral patellar space Device: Philips Affiniti 50G Images permanently stored and available for review in PACS Verbal informed consent obtained.  Discussed risks and benefits of procedure. Warned about infection, bleeding, hyperglycemia damage to structures among others. Patient expresses understanding and agreement Time-out conducted.   Noted no overlying erythema, induration, or other signs of local infection.   Skin prepped in a sterile fashion.   Local anesthesia: Topical Ethyl chloride.   With sterile technique and under real time ultrasound guidance: 40 mg of Kenalog and 2 mL of lidocaine injected into the knee joint. Fluid seen entering the joint capsule.   Completed without difficulty   Pain immediately resolved suggesting accurate placement of the medication.   Advised to call if  fevers/chills, erythema, induration, drainage, or persistent bleeding.   Images permanently stored and available for review in the ultrasound unit.  Impression: Technically successful ultrasound guided injection.    EXAM: RIGHT KNEE 3 VIEWS   COMPARISON:  None available.   FINDINGS: Normal alignment. Shallow trochlear grooves. Moderate tricompartmental peripheral spurring. There is moderate medial and lateral tibiofemoral chondrocalcinosis. Well corticated densities lateral to the patella. No significant knee joint effusion. No erosions or focal bone abnormality.   IMPRESSION: 1. Moderate tricompartmental osteoarthritis. 2. Moderate chondrocalcinosis. 3. Well corticated densities lateral to the patella may represent fragmented osteophytes or sequela of remote injury.     Electronically Signed   By: Keith Rake M.D.   On: 09/01/2021 10:24 I, Lynne Leader, personally (independently) visualized and performed the interpretation of the images attached in this note.     Assessment and Plan: 71 y.o. female with right knee pain.  This is an acute exacerbation of a chronic problem.  Pain thought to be due to exacerbation of DJD.  Plan for steroid injection.  Last injection was October 2022.  Continue quad strengthening exercises previously learned.  Recheck back as needed.   PDMP not reviewed this encounter. Orders Placed This Encounter  Procedures   [place] LIMITED JOINT SPACE STRUCTURES LOW RIGHT(NO LINKED CHARGES)    Order Specific Question:   Reason for Exam (SYMPTOM  OR DIAGNOSIS REQUIRED)    Answer:   right knee pain    Order Specific Question:   Preferred imaging  location?    Answer:   [place]   No orders of the defined types were placed in this encounter.    Discussed warning signs or symptoms. Please see discharge instructions. Patient expresses understanding.   The above documentation has been reviewed and is accurate and complete Lynne Leader, M.D.

## 2021-12-08 DIAGNOSIS — M1711 Unilateral primary osteoarthritis, right knee: Secondary | ICD-10-CM | POA: Insufficient documentation

## 2021-12-16 ENCOUNTER — Encounter: Payer: Self-pay | Admitting: Family Medicine

## 2021-12-16 NOTE — Telephone Encounter (Signed)
Thank you for your kind words.  However I am disappointed that the shot only lasted 6 days.  Next step would be gel injections.  Would you like me to work on the authorization to get the gel injections ready for knee pain due to knee arthritis? The authorization process typically takes about a week.

## 2022-02-01 DIAGNOSIS — R438 Other disturbances of smell and taste: Secondary | ICD-10-CM | POA: Diagnosis not present

## 2022-03-02 DIAGNOSIS — M810 Age-related osteoporosis without current pathological fracture: Secondary | ICD-10-CM | POA: Diagnosis not present

## 2022-03-03 DIAGNOSIS — I10 Essential (primary) hypertension: Secondary | ICD-10-CM | POA: Diagnosis not present

## 2022-03-03 DIAGNOSIS — Z1231 Encounter for screening mammogram for malignant neoplasm of breast: Secondary | ICD-10-CM | POA: Diagnosis not present

## 2022-03-07 DIAGNOSIS — M8580 Other specified disorders of bone density and structure, unspecified site: Secondary | ICD-10-CM | POA: Diagnosis not present

## 2022-03-07 DIAGNOSIS — M810 Age-related osteoporosis without current pathological fracture: Secondary | ICD-10-CM | POA: Diagnosis not present

## 2022-03-07 DIAGNOSIS — Z23 Encounter for immunization: Secondary | ICD-10-CM | POA: Diagnosis not present

## 2022-03-12 ENCOUNTER — Other Ambulatory Visit: Payer: Self-pay | Admitting: Cardiovascular Disease

## 2022-03-13 NOTE — Telephone Encounter (Signed)
Prescription refill request for Eliquis received. Indication: PAF Last office visit: 09/26/21  Estevan Ryder MD Scr: 0.64 on 08/30/20 Age: 71 Weight: 68.5kg  Based on above findings Eliquis '5mg'$  twice daily is the appropriate dose.  Refill approved. Requested labs at next OV.

## 2022-03-22 DIAGNOSIS — M1180 Other specified crystal arthropathies, unspecified site: Secondary | ICD-10-CM | POA: Diagnosis not present

## 2022-03-22 DIAGNOSIS — M25559 Pain in unspecified hip: Secondary | ICD-10-CM | POA: Diagnosis not present

## 2022-03-22 DIAGNOSIS — M170 Bilateral primary osteoarthritis of knee: Secondary | ICD-10-CM | POA: Diagnosis not present

## 2022-03-22 DIAGNOSIS — M25569 Pain in unspecified knee: Secondary | ICD-10-CM | POA: Diagnosis not present

## 2022-05-01 DIAGNOSIS — H0012 Chalazion right lower eyelid: Secondary | ICD-10-CM | POA: Diagnosis not present

## 2022-05-13 IMAGING — DX DG KNEE AP/LAT W/ SUNRISE*R*
3 series · 3 of 3 positions shown · non-contrast
Comparison: None available.

CLINICAL DATA: Chronic right knee pain, recent worsening.

EXAM:
RIGHT KNEE 3 VIEWS

[knee ap]
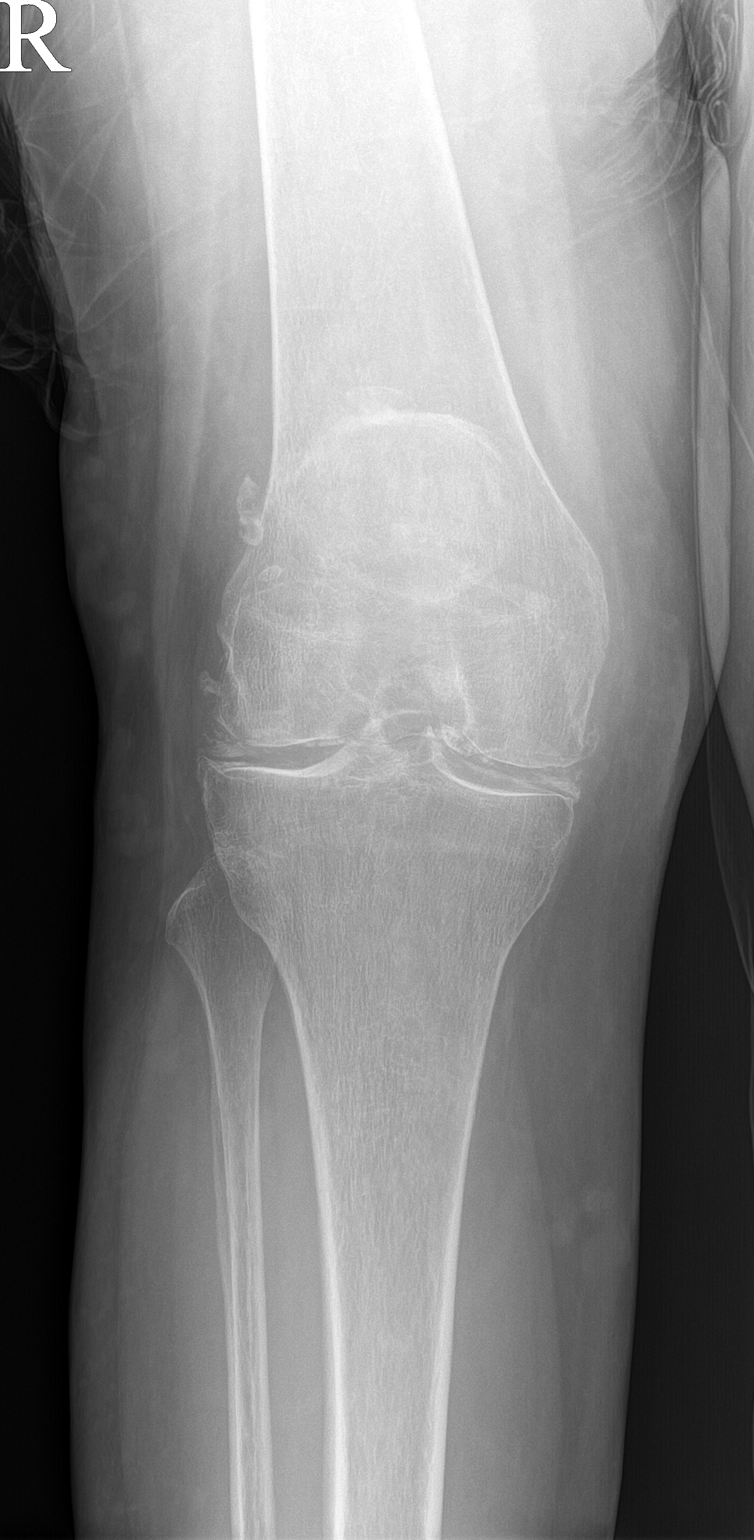

[knee lat]
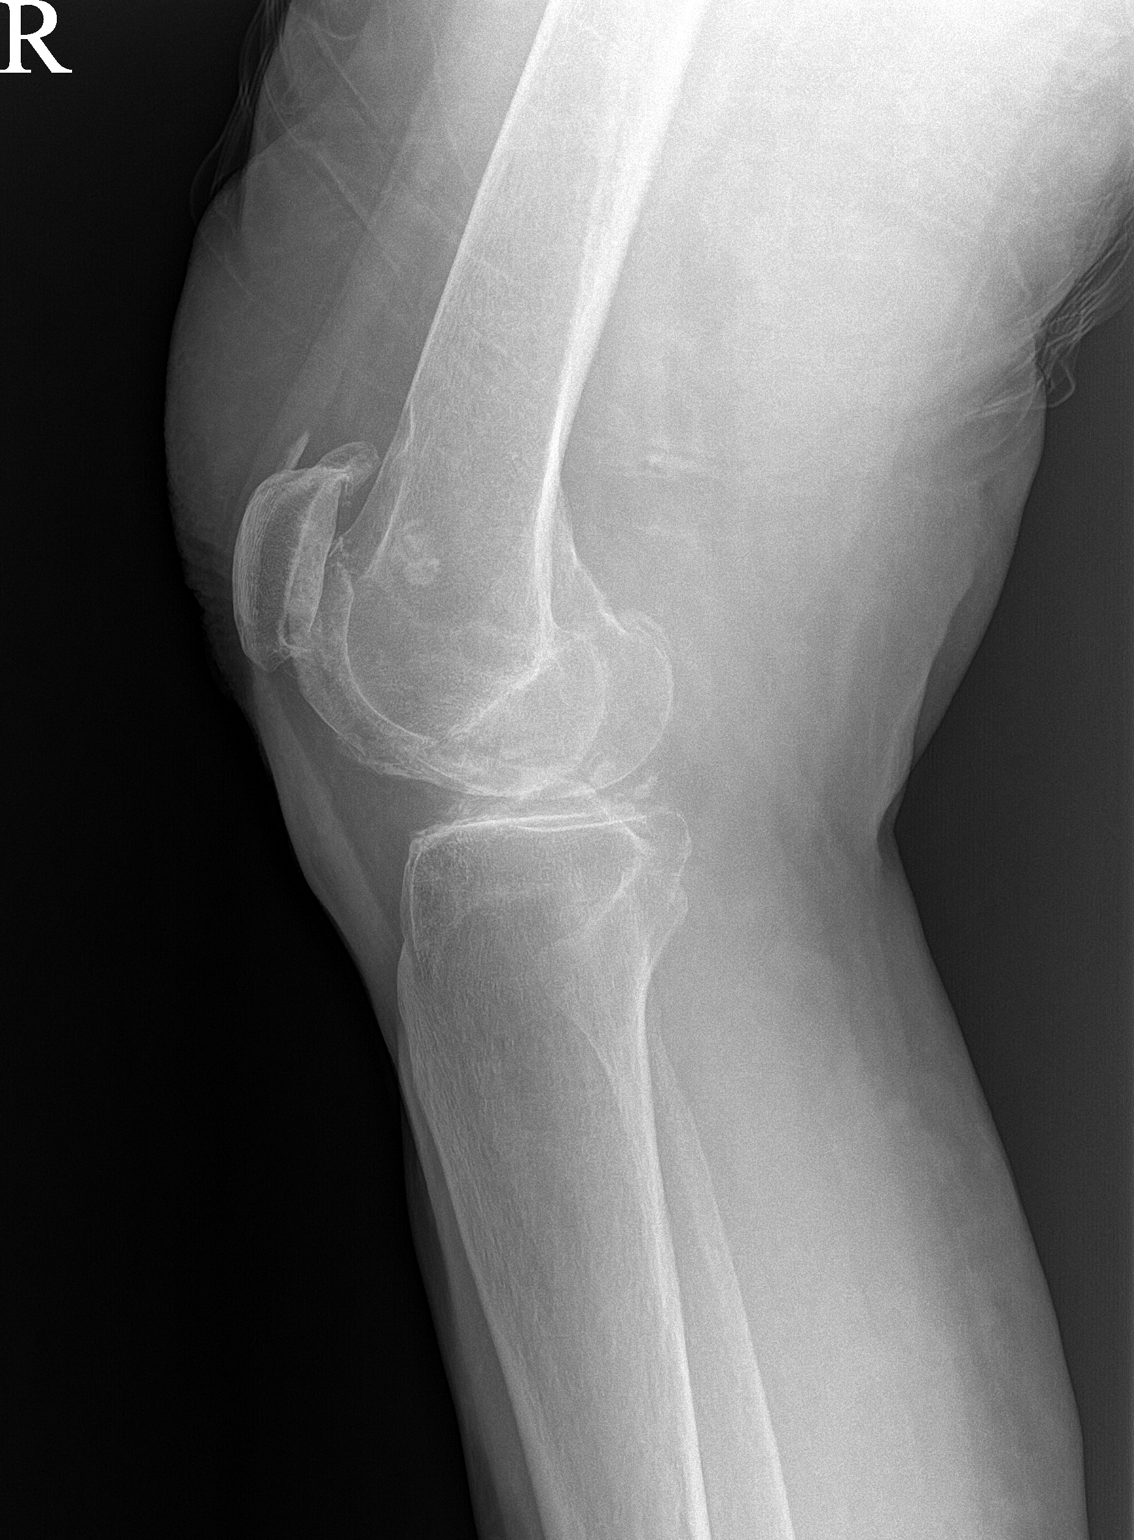

[patella]
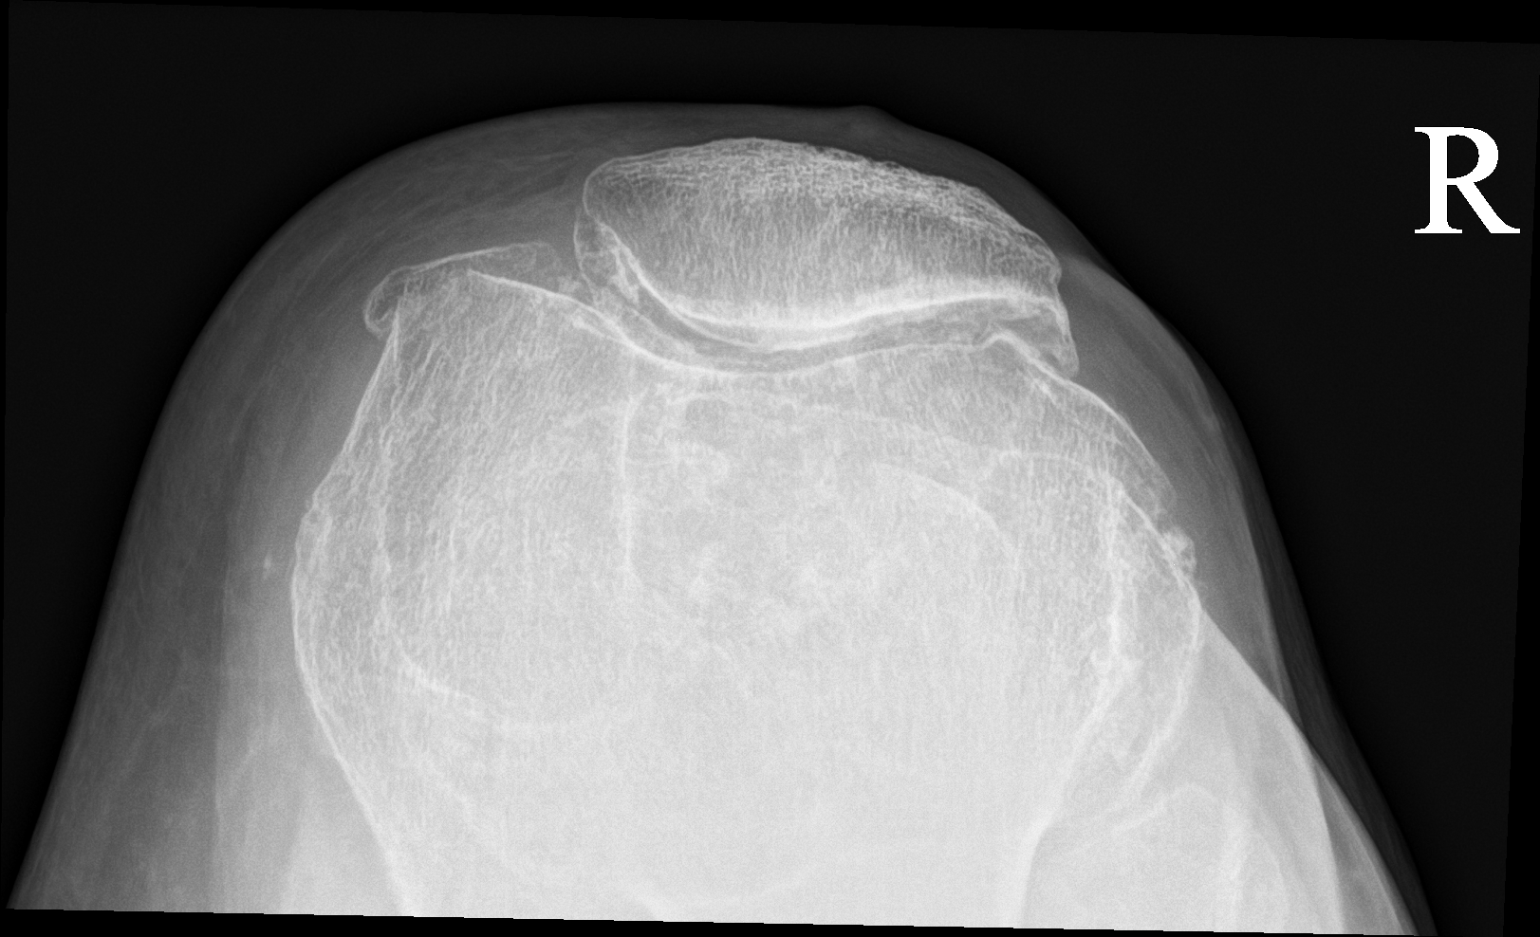

[3 of 3 positions shown; findings below may reference images not displayed]

FINDINGS: Normal alignment. Shallow trochlear grooves. Moderate
tricompartmental peripheral spurring. There is moderate medial and
lateral tibiofemoral chondrocalcinosis. Well corticated densities
lateral to the patella. No significant knee joint effusion. No
erosions or focal bone abnormality.
IMPRESSION: 1. Moderate tricompartmental osteoarthritis.
2. Moderate chondrocalcinosis.
3. Well corticated densities lateral to the patella may represent
fragmented osteophytes or sequela of remote injury.

## 2022-05-21 ENCOUNTER — Encounter: Payer: Self-pay | Admitting: Cardiovascular Disease

## 2022-07-04 DIAGNOSIS — H43813 Vitreous degeneration, bilateral: Secondary | ICD-10-CM | POA: Diagnosis not present

## 2022-07-04 DIAGNOSIS — H59812 Chorioretinal scars after surgery for detachment, left eye: Secondary | ICD-10-CM | POA: Diagnosis not present

## 2022-07-04 DIAGNOSIS — H33193 Other retinoschisis and retinal cysts, bilateral: Secondary | ICD-10-CM | POA: Diagnosis not present

## 2022-07-04 DIAGNOSIS — D3131 Benign neoplasm of right choroid: Secondary | ICD-10-CM | POA: Diagnosis not present

## 2022-07-06 DIAGNOSIS — H938X1 Other specified disorders of right ear: Secondary | ICD-10-CM | POA: Diagnosis not present

## 2022-07-07 DIAGNOSIS — D2221 Melanocytic nevi of right ear and external auricular canal: Secondary | ICD-10-CM | POA: Diagnosis not present

## 2022-07-07 DIAGNOSIS — D485 Neoplasm of uncertain behavior of skin: Secondary | ICD-10-CM | POA: Diagnosis not present

## 2022-07-07 DIAGNOSIS — D2262 Melanocytic nevi of left upper limb, including shoulder: Secondary | ICD-10-CM | POA: Diagnosis not present

## 2022-07-07 DIAGNOSIS — L821 Other seborrheic keratosis: Secondary | ICD-10-CM | POA: Diagnosis not present

## 2022-07-07 DIAGNOSIS — D2261 Melanocytic nevi of right upper limb, including shoulder: Secondary | ICD-10-CM | POA: Diagnosis not present

## 2022-07-17 DIAGNOSIS — R438 Other disturbances of smell and taste: Secondary | ICD-10-CM | POA: Diagnosis not present

## 2022-07-25 DIAGNOSIS — D485 Neoplasm of uncertain behavior of skin: Secondary | ICD-10-CM | POA: Diagnosis not present

## 2022-07-25 DIAGNOSIS — L988 Other specified disorders of the skin and subcutaneous tissue: Secondary | ICD-10-CM | POA: Diagnosis not present

## 2022-07-26 ENCOUNTER — Other Ambulatory Visit: Payer: Self-pay | Admitting: Cardiovascular Disease

## 2022-08-10 DIAGNOSIS — M810 Age-related osteoporosis without current pathological fracture: Secondary | ICD-10-CM | POA: Diagnosis not present

## 2022-08-17 DIAGNOSIS — Z23 Encounter for immunization: Secondary | ICD-10-CM | POA: Diagnosis not present

## 2022-08-17 DIAGNOSIS — D72819 Decreased white blood cell count, unspecified: Secondary | ICD-10-CM | POA: Diagnosis not present

## 2022-08-17 DIAGNOSIS — E7800 Pure hypercholesterolemia, unspecified: Secondary | ICD-10-CM | POA: Diagnosis not present

## 2022-08-17 DIAGNOSIS — M8580 Other specified disorders of bone density and structure, unspecified site: Secondary | ICD-10-CM | POA: Diagnosis not present

## 2022-08-17 DIAGNOSIS — Z0000 Encounter for general adult medical examination without abnormal findings: Secondary | ICD-10-CM | POA: Diagnosis not present

## 2022-08-17 DIAGNOSIS — M109 Gout, unspecified: Secondary | ICD-10-CM | POA: Diagnosis not present

## 2022-08-17 DIAGNOSIS — I10 Essential (primary) hypertension: Secondary | ICD-10-CM | POA: Diagnosis not present

## 2022-08-21 DIAGNOSIS — R438 Other disturbances of smell and taste: Secondary | ICD-10-CM | POA: Diagnosis not present

## 2022-09-05 DIAGNOSIS — M810 Age-related osteoporosis without current pathological fracture: Secondary | ICD-10-CM | POA: Diagnosis not present

## 2022-09-06 ENCOUNTER — Other Ambulatory Visit: Payer: Self-pay | Admitting: Cardiovascular Disease

## 2022-09-06 DIAGNOSIS — I4891 Unspecified atrial fibrillation: Secondary | ICD-10-CM

## 2022-09-06 NOTE — Telephone Encounter (Signed)
Prescription refill request for Eliquis received. Indication: a fib Last office visit: 09/26/21 Scr: 0.64 [facility] 08/10/22 Age: 72 Weight: 71kg

## 2022-09-15 DIAGNOSIS — J328 Other chronic sinusitis: Secondary | ICD-10-CM | POA: Diagnosis not present

## 2022-09-15 DIAGNOSIS — R438 Other disturbances of smell and taste: Secondary | ICD-10-CM | POA: Diagnosis not present

## 2022-09-21 DIAGNOSIS — I10 Essential (primary) hypertension: Secondary | ICD-10-CM | POA: Diagnosis not present

## 2022-09-21 DIAGNOSIS — E7800 Pure hypercholesterolemia, unspecified: Secondary | ICD-10-CM | POA: Diagnosis not present

## 2022-09-21 DIAGNOSIS — Z8639 Personal history of other endocrine, nutritional and metabolic disease: Secondary | ICD-10-CM | POA: Diagnosis not present

## 2022-09-21 DIAGNOSIS — K219 Gastro-esophageal reflux disease without esophagitis: Secondary | ICD-10-CM | POA: Diagnosis not present

## 2022-09-25 ENCOUNTER — Other Ambulatory Visit: Payer: Self-pay | Admitting: Cardiovascular Disease

## 2022-10-05 ENCOUNTER — Ambulatory Visit: Payer: Medicare Other | Admitting: Cardiovascular Disease

## 2022-10-14 ENCOUNTER — Other Ambulatory Visit: Payer: Self-pay | Admitting: Cardiovascular Disease

## 2022-10-24 ENCOUNTER — Ambulatory Visit: Payer: Medicare Other | Admitting: Family Medicine

## 2022-10-24 ENCOUNTER — Other Ambulatory Visit: Payer: Self-pay | Admitting: Cardiovascular Disease

## 2022-10-24 ENCOUNTER — Encounter: Payer: Self-pay | Admitting: Family Medicine

## 2022-10-24 ENCOUNTER — Ambulatory Visit (INDEPENDENT_AMBULATORY_CARE_PROVIDER_SITE_OTHER): Payer: Medicare Other

## 2022-10-24 ENCOUNTER — Other Ambulatory Visit: Payer: Self-pay

## 2022-10-24 VITALS — BP 146/82 | HR 78 | Ht 69.0 in | Wt 155.2 lb

## 2022-10-24 DIAGNOSIS — M25561 Pain in right knee: Secondary | ICD-10-CM | POA: Diagnosis not present

## 2022-10-24 DIAGNOSIS — G8929 Other chronic pain: Secondary | ICD-10-CM

## 2022-10-24 DIAGNOSIS — M25552 Pain in left hip: Secondary | ICD-10-CM

## 2022-10-24 DIAGNOSIS — M25511 Pain in right shoulder: Secondary | ICD-10-CM | POA: Diagnosis not present

## 2022-10-24 DIAGNOSIS — M1711 Unilateral primary osteoarthritis, right knee: Secondary | ICD-10-CM | POA: Diagnosis not present

## 2022-10-24 DIAGNOSIS — M11261 Other chondrocalcinosis, right knee: Secondary | ICD-10-CM | POA: Diagnosis not present

## 2022-10-24 NOTE — Progress Notes (Signed)
I, Stevenson Clinch, CMA acting as a scribe for Kristine Graham, MD.  Kristine Jones is a 72 y.o. female who presents to [facility] at [facility] today for reoccurring right knee pain.  Patient was last seen by Dr. Denyse Jones on 12/07/2021 and was given a right knee steroid injection and advised to continue quad strengthening exercises.  Today, patient reports significant improvement of knee sx since last visit. Pulsating pain has resolved, now the knee just feels sore. Continues to have difficulty straightening the leg. Notes grabbing pinching sensation at the left hip, feels like it could cause a fall though pt has not fallen. Denies lower back pain. Overall, the right shoulder has been well managed. Sx are mostly disruptive at night, mechanical sx present. Minimal pain during the day. Denies radicular sx.   Dx imaging: 08/30/21 R knee XR   Pertinent review of systems: No fevers or chills  Relevant historical information: Osteopenia   Exam:  BP (!) 146/82   Pulse 78   Ht 5\' 9"  (1.753 m)   Wt 155 lb 3.2 oz (70.4 kg)   SpO2 96%   BMI 22.92 kg/m  General: Well Developed, well nourished, and in no acute distress.   MSK: Right knee: Mild effusion.  Normal motion with crepitation.  Tender palpation medial and lateral joint line.   Left hip: Decreased range of motion.  Tender palpation anterior and posterior hip.  Lab and Radiology Results  Procedure: Real-time Ultrasound Guided Injection of right knee joint superior lateral patellar space Device: Philips Affiniti 50G Images permanently stored and available for review in PACS Verbal informed consent obtained.  Discussed risks and benefits of procedure. Warned about infection, bleeding, hyperglycemia damage to structures among others. Patient expresses understanding and agreement Time-out conducted.   Noted no overlying erythema, induration, or other signs of local infection.   Skin prepped in a sterile fashion.   Local  anesthesia: Topical Ethyl chloride.   With sterile technique and under real time ultrasound guidance: 40 mg of Kenalog and 2 mL of Marcaine injected into knee joint. Fluid seen entering the capsule.   Completed without difficulty   Pain immediately resolved suggesting accurate placement of the medication.   Advised to call if fevers/chills, erythema, induration, drainage, or persistent bleeding.   Images permanently stored and available for review in the ultrasound unit.  Impression: Technically successful ultrasound guided injection.   X-ray images left hip obtained today personally and independently interpreted Degenerative changes and calcifications present at the lateral hip joint.  No acute fractures are visible. Await formal radiology review   No results found for this or any previous visit (from the past 72 hour(s)). DG Knee AP/LAT W/Sunrise Right  Result Date: 10/24/2022 CLINICAL DATA:  Pain EXAM: RIGHT KNEE 3 VIEWS COMPARISON:  None Available. FINDINGS: Moderate to severe tricompartmental degenerative changes. Loose bodies in the joint. No joint effusion. Enthesopathic changes off the superior patella. Chondrocalcinosis. IMPRESSION: 1. Moderate to severe tricompartmental degenerative changes. 2. Loose bodies in the joint. 3. Chondrocalcinosis. Electronically Signed   By: Gerome Sam III M.D.   On: 10/24/2022 17:36   I, Kristine Jones, personally (independently) visualized and performed the interpretation of the images attached in this note.     Assessment and Plan: 72 y.o. female with right knee pain due to exacerbation of DJD.  Plan for steroid injection today and continued home exercise program.  Hip pain: X-ray today does show some degenerative changes.   PDMP not reviewed this  encounter. Orders Placed This Encounter  Procedures   [place] LIMITED JOINT SPACE STRUCTURES LOW RIGHT(NO LINKED CHARGES)    Order Specific Question:   Reason for Exam (SYMPTOM  OR DIAGNOSIS REQUIRED)     Answer:   right knee pain    Order Specific Question:   Preferred imaging location?    Answer:   [facility] Knee AP/LAT W/Sunrise Right    Standing Status:   Future    Number of Occurrences:   1    Standing Expiration Date:   11/23/2022    Order Specific Question:   Reason for Exam (SYMPTOM  OR DIAGNOSIS REQUIRED)    Answer:   right knee pain    Order Specific Question:   Preferred imaging location?    Answer:   [facility]   DG HIP UNILAT W OR W/O PELVIS 2-3 VIEWS LEFT    Standing Status:   Future    Number of Occurrences:   1    Standing Expiration Date:   11/23/2022    Order Specific Question:   Reason for Exam (SYMPTOM  OR DIAGNOSIS REQUIRED)    Answer:   left hip pain    Order Specific Question:   Preferred imaging location?    Answer:   [facility]   Ambulatory referral to Physical Therapy    Referral Priority:   Routine    Referral Type:   Physical Medicine    Referral Reason:   Specialty Services Required    Requested Specialty:   Physical Therapy    Number of Visits Requested:   1   No orders of the defined types were placed in this encounter.    Discussed warning signs or symptoms. Please see discharge instructions. Patient expresses understanding.   The above documentation has been reviewed and is accurate and complete Kristine Jones, M.D.

## 2022-10-24 NOTE — Patient Instructions (Signed)
Thank you for coming in today.  You received an injection today. Seek immediate medical attention if the joint becomes red, extremely painful, or is oozing fluid.  Please get an Xray today before you leave

## 2022-10-25 ENCOUNTER — Encounter: Payer: Self-pay | Admitting: Family Medicine

## 2022-10-25 NOTE — Progress Notes (Signed)
Right knee x-ray shows significant arthritis.

## 2022-10-27 DIAGNOSIS — E7800 Pure hypercholesterolemia, unspecified: Secondary | ICD-10-CM | POA: Diagnosis not present

## 2022-10-27 DIAGNOSIS — F411 Generalized anxiety disorder: Secondary | ICD-10-CM | POA: Diagnosis not present

## 2022-10-27 DIAGNOSIS — R430 Anosmia: Secondary | ICD-10-CM | POA: Diagnosis not present

## 2022-10-27 DIAGNOSIS — D72819 Decreased white blood cell count, unspecified: Secondary | ICD-10-CM | POA: Diagnosis not present

## 2022-10-31 NOTE — Progress Notes (Signed)
Left hip x-ray shows medium to severe hip arthritis

## 2022-11-01 ENCOUNTER — Telehealth: Payer: Self-pay

## 2022-11-01 NOTE — Telephone Encounter (Signed)
Pt called the office reporting increased R knee pain. No new injury. Pt reports R knee has become very stiff and painful. Schedule f/u visit for tomorrow morning.

## 2022-11-01 NOTE — Progress Notes (Unsigned)
Rubin Payor, PhD, LAT, ATC acting as a scribe for [name], MD.  Kristine Jones is a 72 y.o. female who presents to [facility] at [facility] today for worsening R knee pain. Pt was last seen by Dr. Denyse Amass on 10/24/22 and was given a R knee steroid injection. Pt called the office yesterday, reporting increased R knee pain. R knee becoming very painful and stiff. Today, pt reports a couple days after the steroid injection her R knee became more painful. No redness. She c/o severe pain yesterday, unable to walk, fully extend. She does recall that she moved some small pieces of furniture and did some floor work PT exercises.  R knee swelling: yes  Dx imaging: 10/24/22 R knee XR 08/30/21 R knee XR   Pertinent review of systems: No fevers or chills.  No night sweats.  Relevant historical information: Atrial fibrillation.  Pseudogout.  Currently taking diltiazem.   Exam:  BP (!) 146/74   Pulse 67   Ht 5\' 9"  (1.753 m)   Wt 156 lb (70.8 kg)   SpO2 97%   BMI 23.04 kg/m  General: Well Developed, well nourished, and in no acute distress.   MSK: Right knee mild effusion.  Normal-appearing otherwise. Mildly tender palpation. Decreased range of motion lacks full extension by about 3 degrees.     Lab and Radiology Results  Diagnostic Limited MSK Ultrasound of: Right knee Mild joint effusion is present with increased thickness of the synovium visible at the superior patellar space. Impression: Mild joint effusion      Assessment and Plan: 72 y.o. female with right knee pain worsening following a intra-articular steroid injection occurring 9 days ago.  Pain started about 24 to 48 hours after the injection.  Her pain is improving a bit today compared to yesterday but she rates it as moderate to severe currently and severe previously.  She does not have fevers or chills or other systemic signs or symptoms concerning for infection.  Concern for steroid flare,  infection, flair of pseudogout.  The appearance of the knee joint ultrasound does not show a large effusion which is reassuring for septic joint.  Additionally the lack of systemic symptoms is also reassuring.  She would like to avoid a aspiration which is reasonable. Plan for sed rate, CRP and CBC w diff.  Start colchicine.  There is a drug interaction between colchicine and diltiazem which would increase the effective dose of colchicine.  Will use one quarter of the typical dose which should be reasonably safe.  I talked to [place] about this drug interaction. I also provided her my cell phone number so that she can contact me over the weekend if needed.  Will prescribe hydrocodone for severe pain control.   PDMP reviewed during this encounter. Orders Placed This Encounter  Procedures   [place] LIMITED JOINT SPACE STRUCTURES LOW RIGHT(NO LINKED CHARGES)    Order Specific Question:   Reason for Exam (SYMPTOM  OR DIAGNOSIS REQUIRED)    Answer:   right knee pain    Order Specific Question:   Preferred imaging location?    Answer:   [facility] rate    Standing Status:   Future    Standing Expiration Date:   11/02/2023   C-reactive protein    Standing Status:   Future    Standing Expiration Date:   11/02/2023   CBC w/Diff    Standing Status:   Future  Standing Expiration Date:   11/02/2023   Meds ordered this encounter  Medications   HYDROcodone-acetaminophen (NORCO/VICODIN) 5-325 MG tablet    Sig: Take 1 tablet by mouth every 6 (six) hours as needed.    Dispense:  15 tablet    Refill:  0   colchicine 0.6 MG tablet    Sig: Take 0.5 tablets (0.3 mg total) by mouth every other day. As needed gout pain.    Dispense:  30 tablet    Refill:  2    Dose adjustment due to diltiazem     Discussed warning signs or symptoms. Please see discharge instructions. Patient expresses understanding.   The above documentation has been reviewed and is accurate  and complete [name], M.D.

## 2022-11-02 ENCOUNTER — Ambulatory Visit: Payer: Medicare Other | Admitting: Family Medicine

## 2022-11-02 ENCOUNTER — Other Ambulatory Visit: Payer: Self-pay

## 2022-11-02 VITALS — BP 146/74 | HR 67 | Ht 69.0 in | Wt 156.0 lb

## 2022-11-02 DIAGNOSIS — G8929 Other chronic pain: Secondary | ICD-10-CM | POA: Diagnosis not present

## 2022-11-02 DIAGNOSIS — M25561 Pain in right knee: Secondary | ICD-10-CM

## 2022-11-02 DIAGNOSIS — R52 Pain, unspecified: Secondary | ICD-10-CM

## 2022-11-02 LAB — SEDIMENTATION RATE: Sed Rate: 20 mm/hr (ref 0–30)

## 2022-11-02 LAB — CBC WITH DIFFERENTIAL/PLATELET
Basophils Absolute: 0 10*3/uL (ref 0.0–0.1)
Basophils Relative: 0.4 % (ref 0.0–3.0)
Eosinophils Absolute: 0.1 10*3/uL (ref 0.0–0.7)
Eosinophils Relative: 1.5 % (ref 0.0–5.0)
HCT: 42.5 % (ref 36.0–46.0)
Hemoglobin: 13.9 g/dL (ref 12.0–15.0)
Lymphocytes Relative: 27.1 % (ref 12.0–46.0)
Lymphs Abs: 1 10*3/uL (ref 0.7–4.0)
MCHC: 32.7 g/dL (ref 30.0–36.0)
MCV: 88.5 fl (ref 78.0–100.0)
Monocytes Absolute: 0.4 10*3/uL (ref 0.1–1.0)
Monocytes Relative: 10.2 % (ref 3.0–12.0)
Neutro Abs: 2.2 10*3/uL (ref 1.4–7.7)
Neutrophils Relative %: 60.8 % (ref 43.0–77.0)
Platelets: 323 10*3/uL (ref 150.0–400.0)
RBC: 4.8 Mil/uL (ref 3.87–5.11)
RDW: 13.8 % (ref 11.5–15.5)
WBC: 3.5 10*3/uL — ABNORMAL LOW (ref 4.0–10.5)

## 2022-11-02 LAB — C-REACTIVE PROTEIN: CRP: <1 mg/dL (ref 0.5–20.0)

## 2022-11-02 MED ORDER — HYDROCODONE-ACETAMINOPHEN 5-325 MG PO TABS: 1.0000 | ORAL_TABLET | Freq: Four times a day (QID) | ORAL | 0 refills | Status: DC | PRN

## 2022-11-02 MED ORDER — COLCHICINE 0.6 MG PO TABS: 0.3000 mg | ORAL_TABLET | ORAL | 2 refills | Status: DC

## 2022-11-02 NOTE — Patient Instructions (Addendum)
Thank you for coming in today.   Please get labs today before you leave   Use colchince 1/2 pill every other daily as needed for flair ups of pseudogout.   Use hydrocodone for severe pain  My cell phone number is [phone] please text me over the weekend if you are not ok or if the labs are not normal.

## 2022-11-03 ENCOUNTER — Encounter: Payer: Self-pay | Admitting: Family Medicine

## 2022-11-03 NOTE — Progress Notes (Signed)
Labs are reassuring.  No evidence of infection on blood labs.  How are you feeling?

## 2022-11-07 NOTE — Therapy (Unsigned)
OUTPATIENT PHYSICAL THERAPY LOWER EXTREMITY EVALUATION   Patient Name: Kristine Jones MRN: [id number] DOB:[birth year], 72 y.o., female Today's Date: 11/08/2022  END OF SESSION:  PT End of Session - 11/08/22 0848     Visit Number 1    Number of Visits 18    Date for PT Re-Evaluation 01/31/23    Authorization Type BCBS    PT Start Time 0848    PT Stop Time 0930    PT Time Calculation (min) 42 min    Activity Tolerance Patient limited by pain    Behavior During Therapy [facility] for tasks assessed/performed             Past Medical History:  Diagnosis Date   Abnormal stress test    a. 09/2008, ST depression on ETT @ [facility], no further w/u. // Cath 8/14: no CAD   Adenomatous colon polyp    Anxiety and depression    Aortic insufficiency    a. 06/2012 Echo: EF 55-60%, Gr 1 DD, Mild AI, Triv MR, mildly dil LA, nl RV fxn. // Echo 03/2019: EF 60-65, normal RVSF, mod AI, trace MR, trivial TR, Asc Aorta 38 mm, GLS -18.1%   Enlarged thoracic aorta (HCC)    MRA 4/19: 4.1 cm // CT 09/2019: 4 cm // Chest CTA 5/22: Ascending aorta 3.8 cm    History of melanoma    Followed with Dr. Danella Deis   HTN (hypertension)    Hyperlipidemia    Leukocytosis    Mild   Migraines    Osteopenia    Paroxysmal atrial fibrillation (HCC)    admx in 09/2019 w/ choledocho lithiasis >> ERCP // several days later presented to ED with AF with RVR >> DCCV in ED unsuccessful; CHADS-VASc 4 >> Apixaban started for at least 1 month - ?? long term Rx   Varicose veins    Past Surgical History:  Procedure Laterality Date   BTL     CHOLECYSTECTOMY     DILATION AND CURETTAGE OF UTERUS     ERCP N/A 09/17/2019   Procedure: ENDOSCOPIC RETROGRADE CHOLANGIOPANCREATOGRAPHY (ERCP);  Surgeon: Kerin Salen, MD;  Location: [facility] ENDOSCOPY;  Service: Gastroenterology;  Laterality: N/A;   FEMORAL HERNIA REPAIR     High endocervical polyp removed     IR EMBO VENOUS NOT HEMORR HEMANG  INC GUIDE ROADMAPPING  07/20/2020   LIPOMA  EXCISION Right 03/23/2015   Procedure: EXCISION LARGE LIPOMA RIGHT UPPER ARM  ;  Surgeon: Eloise Levels, MD;  Location: [facility];  Service: Plastics;  Laterality: Right;   MELANOMA EXCISION  2003   right calf   OVARIAN CYST REMOVAL     REMOVAL OF STONES  09/17/2019   Procedure: REMOVAL OF STONES;  Surgeon: Kerin Salen, MD;  Location: [facility] ENDOSCOPY;  Service: Gastroenterology;;   Dennison Mascot  09/17/2019   Procedure: SPHINCTEROTOMY;  Surgeon: Kerin Salen, MD;  Location: [facility] ENDOSCOPY;  Service: Gastroenterology;;   Patient Active Problem List   Diagnosis Date Noted   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   Atrial fibrillation with RVR (HCC)    Anxiety and depression    Choledocholithiasis    Elevated liver enzymes    Hypotension    Osteopenia    Leukocytopenia 06/23/2014   HYPERLIPIDEMIA-MIXED 10/12/2009   HYPERTENSION, BENIGN 10/12/2009   AORTIC INSUFFICIENCY 10/12/2009   ANXIETY DEPRESSION 10/08/2009   GERD 10/08/2009   PMS 10/08/2009   Disorder of bone and cartilage 10/08/2009   COLONIC POLYPS, ADENOMATOUS, HX OF 10/08/2009  MIGRAINES, HX OF 10/08/2009    PCP: Merri Brunette, MD  REFERRING PROVIDER:   Rodolph Bong, MD    REFERRING DIAG:  [phone] (ICD-10-CM) - Chronic pain of right knee  M25.552 (ICD-10-CM) - Left hip pain  M25.511,G89.29 (ICD-10-CM) - Chronic right shoulder pain    THERAPY DIAG:  Difficulty in walking, not elsewhere classified  Muscle weakness (generalized)  Chronic pain of right knee  Rationale for Evaluation and Treatment: Rehabilitation  ONSET DATE: chronic knee and hip pain  SUBJECTIVE:   SUBJECTIVE STATEMENT: States she went to see Dr. Denyse Amass because she was having left hip pain. States her knee was bothering her a little so she had a knee injection on 10/24/22 and it gradually got worse. States that she could barely walk. States she felt like her knee was in excruciating pain and that it was stiff and swollen. States she  f/u with MD and xrays showed arthritis.   States she has no hip pain now.  Recent injection in knee increased knee pain.  PERTINENT HISTORY: pseudo gout PAIN:  Are you having pain? Yes: NPRS scale: 7/10 Pain location: right anterior knee  Pain description: achy and tender Aggravating factors: moving, weightbearing Relieving factors: rest   PRECAUTIONS: None  WEIGHT BEARING RESTRICTIONS: No  FALLS:  Has patient fallen in last 6 months? 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 Moderate-to-severe degenerative changes of the left hip joint.  Right knee FINDINGS: Moderate to severe tricompartmental degenerative changes. Loose bodies in the joint. No joint effusion. Enthesopathic changes off the superior patella. Chondrocalcinosis.   IMPRESSION: 1. Moderate to severe tricompartmental degenerative changes. 2. Loose bodies in the joint. 3. Chondrocalcinosis.   COGNITION: Overall cognitive status: Within functional limits for tasks assessed     SENSATION: Not tested  EDEMA:   R knee joint 15.25 inches --> 14.75 inches after edema massage Left knee joint 14.5 inches  R calf 15.0 inches L calf 14.75 inches   POSTURE: forward head and flexed trunk   PALPATION: Tenderness to light palpation throughout right lower extremity    LE Measurements Lower Extremity Right EVAL Left EVAL   A/PROM MMT A/PROM MMT  Hip Flexion      Hip Extension      Hip Abduction      Hip Adduction      Hip Internal rotation      Hip External rotation      Knee Flexion 130 4 120 4  Knee Extension 0 4 25 (lacking) 4-  Ankle Dorsiflexion      Ankle Plantarflexion       Ankle Inversion      Ankle Eversion       (Blank rows = not tested) * pain   GAIT: Distance walked: 25 feet within clinic Assistive device utilized: None Level of assistance: Modified independence Comments: Antalgic limited knee and hip motion on right   TODAY'S TREATMENT:  DATE:    Therapeutic Exercise:  Aerobic: Supine: heel slides x15 5" holds R,  Prone:  Seated:  Standing: Neuromuscular Re-education: Manual Therapy: edema massage R 10 minutes  Therapeutic Activity: Self Care: Trigger Point Dry Needling:  Modalities:    PATIENT EDUCATION:  Education details: on current presentation, on HEP, on clinical outcomes score and POC, on use of compression garments regularly, on movement of knee, on managing swelling and how to stimulate lymph system. Person educated: Patient Education method: Explanation, Demonstration, and Handouts Education comprehension: verbalized understanding   HOME EXERCISE PROGRAM: [id number]  ASSESSMENT:  CLINICAL IMPRESSION: Patient presents to physical therapy with complaints of chronic right knee pain with recent acute exacerbation of right knee pain.  Overall pain has improved since episode started but continues to be swollen and painful.  Educated patient on strategies to reduce swelling and improve overall knee mobility and reduce pain.  Stressed importance of compliance with compression garments.  Patient would greatly benefit from skilled physical therapy to improve overall function, reduce pain and improve knee mobility.  OBJECTIVE IMPAIRMENTS: Abnormal gait, decreased activity tolerance, decreased balance, decreased endurance, decreased knowledge of use of DME, decreased mobility, difficulty walking, decreased ROM, decreased strength, increased edema, postural dysfunction, and pain.   ACTIVITY LIMITATIONS:  bending, standing, squatting, sleeping, stairs, and locomotion level  PARTICIPATION LIMITATIONS: meal prep, cleaning, and community activity  PERSONAL FACTORS: Fitness and Time since onset of injury/illness/exacerbation are also affecting patient's functional outcome.   REHAB POTENTIAL: Good  CLINICAL DECISION MAKING: Stable/uncomplicated  EVALUATION COMPLEXITY: Low   GOALS: Goals reviewed with patient? yes  SHORT TERM GOALS: Target date: 12/20/2022  Patient will be independent in self management strategies to improve quality of life and functional outcomes. Baseline: New Program Goal status: INITIAL  2.  Patient will report at least 50% improvement in overall symptoms and/or function to demonstrate improved functional mobility Baseline: 0% better Goal status: INITIAL  3.  Patient will report wearing compression garments regularly to reduce swelling in legs Baseline: Not currently wearing Goal status: INITIAL     LONG TERM GOALS: Target date: 01/31/2023   Patient will report at least 75% improvement in overall symptoms and/or function to demonstrate improved functional mobility Baseline: 0% better Goal status: INITIAL  2.  Patient will be able to ambulate without pain in right knee to improve household mobility Baseline: Painful Goal status: INITIAL  3.  Swelling will regularly be within half an inch of left leg to demonstrate reduced swelling in right lower extremity Baseline: See above Goal status: INITIAL      PLAN:  PT FREQUENCY: 1-2x/week for total of 18 visits over 12 weeks certification period  PT DURATION: 12 weeks  PLANNED INTERVENTIONS: Therapeutic exercises, Therapeutic activity, Neuromuscular re-education, Balance training, Gait training, Patient/Family education, Self Care, Joint mobilization, Joint manipulation, Stair training, Vestibular training, Canalith repositioning, Orthotic/Fit training, Prosthetic training, DME instructions, Aquatic Therapy,  Dry Needling, Electrical stimulation, Spinal manipulation, Spinal mobilization, Cryotherapy, Moist heat, Taping, Traction, Ultrasound, Ionotophoresis 4mg /ml Dexamethasone, Manual therapy, and Re-evaluation.   PLAN FOR NEXT SESSION: Edema management, follow-up with compression garments, lower extremity strength and mobility possibly educated on use of cane when knee agitated   10:44 AM, 11/08/22 Tereasa Coop, DPT Physical Therapy with [facility]

## 2022-11-08 ENCOUNTER — Ambulatory Visit: Payer: Medicare Other | Admitting: Physical Therapy

## 2022-11-08 ENCOUNTER — Encounter: Payer: Self-pay | Admitting: Physician Assistant

## 2022-11-08 ENCOUNTER — Encounter: Payer: Self-pay | Admitting: Physical Therapy

## 2022-11-08 ENCOUNTER — Ambulatory Visit: Payer: Medicare Other | Attending: Cardiovascular Disease | Admitting: Physician Assistant

## 2022-11-08 VITALS — BP 160/82 | HR 69 | Ht 69.0 in | Wt 155.8 lb

## 2022-11-08 DIAGNOSIS — I77810 Thoracic aortic ectasia: Secondary | ICD-10-CM

## 2022-11-08 DIAGNOSIS — I480 Paroxysmal atrial fibrillation: Secondary | ICD-10-CM

## 2022-11-08 DIAGNOSIS — I351 Nonrheumatic aortic (valve) insufficiency: Secondary | ICD-10-CM

## 2022-11-08 DIAGNOSIS — R262 Difficulty in walking, not elsewhere classified: Secondary | ICD-10-CM

## 2022-11-08 DIAGNOSIS — E7800 Pure hypercholesterolemia, unspecified: Secondary | ICD-10-CM

## 2022-11-08 DIAGNOSIS — I10 Essential (primary) hypertension: Secondary | ICD-10-CM | POA: Diagnosis not present

## 2022-11-08 DIAGNOSIS — M6281 Muscle weakness (generalized): Secondary | ICD-10-CM | POA: Diagnosis not present

## 2022-11-08 DIAGNOSIS — G8929 Other chronic pain: Secondary | ICD-10-CM

## 2022-11-08 DIAGNOSIS — M25561 Pain in right knee: Secondary | ICD-10-CM

## 2022-11-08 MED ORDER — ATENOLOL 25 MG PO TABS: 25.0000 mg | ORAL_TABLET | Freq: Every day | ORAL | 2 refills | Status: DC | PRN

## 2022-11-08 MED ORDER — ATENOLOL 25 MG PO TABS: 25.0000 mg | ORAL_TABLET | Freq: Two times a day (BID) | ORAL | 2 refills | Status: DC

## 2022-11-08 NOTE — Assessment & Plan Note (Signed)
39 mm by echocardiogram in December 2022.  She will have a follow-up echocardiogram again in December 2024.

## 2022-11-08 NOTE — Assessment & Plan Note (Signed)
Her 10-year ASCVD risk is 13.6%.  However, she had coronary CTA in May 2022 that demonstrated calcium score of 0 and no CAD.  It is reasonable to not start statin therapy at this time.  Continue diet and exercise.  If her LDL increases over time, it would be reasonable to consider adding moderate intensity statin therapy.

## 2022-11-08 NOTE — Progress Notes (Signed)
Cardiology Office Note:    Date:  11/08/2022  ID:  [name], [name] August 27, 1950, MRN [id number] PCP: Merri Brunette, MD  [facility] Providers Cardiologist:  Verne Carrow, MD       Patient Profile:      Aortic insufficiency  Possible bicuspid AoV TTE 10/19: EF 65-70, Gr 1 DD, mod AI, mild MR TTE 04/01/2019: EF 60-65, mod AI, Asc Aorta 38 mm TTE 04/15/20: EF 65-70, Mod AI TTE 04/14/2021: EF 65-70, no RWMA, GLS -22.4, normal RVSF, trivial MR, moderate AI, AV sclerosis, ascending aorta 39 mm, RAP 3 Paroxysmal atrial fibrillation Noted several days after admx w/ choledocholithiasis; DCCV initially failed in ED Intol of beta-blocker (fatigue) >> Diltiazem  Monitor 11/2019: several short SVTs (longest 18 beats) Hypertension  Hyperlipidemia  Thoracic aortic aneurysm  CT 5/22: Ascending aorta 39 mm Cardiac catheterization 2014: no CAD CCTA 09/20/2020: CAC score 0, no CAD      History of Present Illness:   Kristine Jones is a 72 y.o. female who returns for follow-up of A-fib, aortic insufficiency.  She was last seen by Dr. Clifton James 09/26/2021.  She is here alone.  She has not had chest pain, shortness of breath, syncope, orthopnea, leg edema.  Over the past few months, she has had 3 episodes of rapid heart rates.  She has recorded her heart rate in the 140s.  This has gone on for as much is 3 hours at 1 point.  The last episode was June 14.  The episode before that was in March.  She monitors her blood pressure at home.  She usually has higher blood pressures in the clinic.  Recent blood pressures at home with been in the 120s and optimal.  Review of Systems  Gastrointestinal:  Negative for hematochezia and melena.  Genitourinary:  Negative for hematuria.   see HPI    Studies Reviewed:   EKG Interpretation  Date/Time:  Wednesday November 08 2022 15:19:17 EDT Ventricular Rate:  69 PR Interval:  150 QRS Duration: 108 QT Interval:  400 QTC Calculation: 428 R  Axis:   -25 Text Interpretation: Normal sinus rhythm Right atrial enlargement Minimal voltage criteria for LVH, may be normal variant ( Cornell product ) Anteroseptal infarct , age undetermined No significant change since ECG done 09/26/21 Confirmed by Tereso Newcomer [phone]) on 11/08/2022 3:45:38 PM    Risk Assessment/Calculations:    CHA2DS2-VASc Score = 3   This indicates a 3.2% annual risk of stroke. The patient's score is based upon: CHF History: 0 HTN History: 1 Diabetes History: 0 Stroke History: 0 Vascular Disease History: 0 Age Score: 1 Gender Score: 1    HYPERTENSION CONTROL Vitals:   11/08/22 1507 11/08/22 1656  BP: (!) 140/63 (!) 160/82    The patient's blood pressure is elevated above target today.  In order to address the patient's elevated BP: The blood pressure is usually elevated in clinic.  Blood pressures monitored at home have been optimal.          Physical Exam:   VS:  BP (!) 160/82   Pulse 69   Ht 5\' 9"  (1.753 m)   Wt 155 lb 12.8 oz (70.7 kg)   SpO2 97%   BMI 23.01 kg/m    Wt Readings from Last 3 Encounters:  11/08/22 155 lb 12.8 oz (70.7 kg)  11/02/22 156 lb (70.8 kg)  10/24/22 155 lb 3.2 oz (70.4 kg)    Constitutional:      Appearance: Healthy appearance. Not  in distress.  Neck:     Vascular: JVD normal.  Pulmonary:     Breath sounds: Normal breath sounds. No wheezing. No rales.  Cardiovascular:     Normal rate. Regular rhythm.     Murmurs: There is no murmur.  Edema:    Peripheral edema absent.  Abdominal:     Palpations: Abdomen is soft.      ASSESSMENT AND PLAN:   Paroxysmal atrial fibrillation (HCC) EKG today demonstrates sinus rhythm.  She has noted episodes of rapid palpitations over the past several months.  These are not that frequent.  Therefore, I do not think a follow-up event monitor would yield results.  She did have a monitor in the past that demonstrated short episodes of SVT.  Her palpitations may be related to rapid  atrial fibrillation versus SVT.  We discussed how to do vagal maneuvers.  She does note some fatigue that she attributes to diltiazem.  I have encouraged her to try to remain on her current dose.  If she continues to have side effects, we could consider changing diltiazem to verapamil.  She did have fatigue with metoprolol in the past and this was discontinued.  I think she would likely be able to tolerate an as needed beta-blocker that is hydrophilic. Continue Eliquis 5 mg twice daily (based on age, weight, creatinine) Continue diltiazem 180 mg in the morning and 120 mg in the evening Prescription for atenolol 25 mg daily as needed Vagal maneuvers as needed Consider repeat monitoring if symptoms become more frequent  Aortic regurgitation Moderate AI by echocardiogram December 2022.  Arrange follow-up echocardiogram in December 2024.  HYPERTENSION, BENIGN She tends to have elevated blood pressures in the clinic.  Her blood pressures at home have been optimal.  Continue diltiazem 180 mg in the morning, 120 mg in the evening, irbesartan 75 mg daily.  Pure hypercholesterolemia Her 10-year ASCVD risk is 13.6%.  However, she had coronary CTA in May 2022 that demonstrated calcium score of 0 and no CAD.  It is reasonable to not start statin therapy at this time.  Continue diet and exercise.  If her LDL increases over time, it would be reasonable to consider adding moderate intensity statin therapy.  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  She will have a follow-up echocardiogram again in December 2024.      Dispo:  Return in about 1 year (around 11/08/2023) for Routine Follow Up with Dr. Clifton James.  Signed, Tereso Newcomer, PA-C

## 2022-11-08 NOTE — Assessment & Plan Note (Signed)
Moderate AI by echocardiogram December 2022.  Arrange follow-up echocardiogram in December 2024.

## 2022-11-08 NOTE — Patient Instructions (Addendum)
Medication Instructions:  Your physician has recommended you make the following change in your medication:  START Atenolol 25mg  1 tablet by mouth as needed for rapid heart rate.   *If you need a refill on your cardiac medications before your next appointment, please call your pharmacy*   Lab Work: None  If you have labs (blood work) drawn today and your tests are completely normal, you will receive your results only by: MyChart Message (if you have MyChart) OR A paper copy in the mail If you have any lab test that is abnormal or we need to change your treatment, we will call you to review the results.   Testing/Procedures: Your physician has requested that you have an echocardiogram due Dec 2024. Echocardiography is a painless test that uses sound waves to create images of your heart. It provides your doctor with information about the size and shape of your heart and how well your heart's chambers and valves are working. This procedure takes approximately one hour. There are no restrictions for this procedure. Please do NOT wear cologne, perfume, aftershave, or lotions (deodorant is allowed). Please arrive 15 minutes prior to your appointment time.    Follow-Up: At [facility], you and your health needs are our priority.  As part of our continuing mission to provide you with exceptional heart care, we have created designated Provider Care Teams.  These Care Teams include your primary Cardiologist (physician) and Advanced Practice Providers (APPs -  Physician Assistants and Nurse Practitioners) who all work together to provide you with the care you need, when you need it.  We recommend signing up for the patient portal called "MyChart".  Sign up information is provided on this After Visit Summary.  MyChart is used to connect with patients for Virtual Visits (Telemedicine).  Patients are able to view lab/test results, encounter notes, upcoming appointments, etc.  Non-urgent messages  can be sent to your provider as well.   To learn more about what you can do with MyChart, go to [email or web address].    Your next appointment:   1 year(s)  Provider:   Verne Carrow, MD     Other Instructions Your provider recommends vagal exercises.

## 2022-11-08 NOTE — Assessment & Plan Note (Signed)
She tends to have elevated blood pressures in the clinic.  Her blood pressures at home have been optimal.  Continue diltiazem 180 mg in the morning, 120 mg in the evening, irbesartan 75 mg daily.

## 2022-11-08 NOTE — Assessment & Plan Note (Signed)
EKG today demonstrates sinus rhythm.  She has noted episodes of rapid palpitations over the past several months.  These are not that frequent.  Therefore, I do not think a follow-up event monitor would yield results.  She did have a monitor in the past that demonstrated short episodes of SVT.  Her palpitations may be related to rapid atrial fibrillation versus SVT.  We discussed how to do vagal maneuvers.  She does note some fatigue that she attributes to diltiazem.  I have encouraged her to try to remain on her current dose.  If she continues to have side effects, we could consider changing diltiazem to verapamil.  She did have fatigue with metoprolol in the past and this was discontinued.  I think she would likely be able to tolerate an as needed beta-blocker that is hydrophilic. Continue Eliquis 5 mg twice daily (based on age, weight, creatinine) Continue diltiazem 180 mg in the morning and 120 mg in the evening Prescription for atenolol 25 mg daily as needed Vagal maneuvers as needed Consider repeat monitoring if symptoms become more frequent

## 2022-11-21 ENCOUNTER — Ambulatory Visit: Payer: Medicare Other | Admitting: Physical Therapy

## 2022-11-21 ENCOUNTER — Encounter: Payer: Self-pay | Admitting: Physical Therapy

## 2022-11-21 DIAGNOSIS — M6281 Muscle weakness (generalized): Secondary | ICD-10-CM

## 2022-11-21 DIAGNOSIS — G8929 Other chronic pain: Secondary | ICD-10-CM

## 2022-11-21 DIAGNOSIS — M25561 Pain in right knee: Secondary | ICD-10-CM

## 2022-11-21 DIAGNOSIS — R262 Difficulty in walking, not elsewhere classified: Secondary | ICD-10-CM

## 2022-11-21 NOTE — Therapy (Signed)
OUTPATIENT PHYSICAL THERAPY LOWER EXTREMITY Treatment   Patient Name: SUFIA MAHALA MRN: [id number] DOB:[birth year], 72 y.o., female Today's Date: 11/21/2022  END OF SESSION:  PT End of Session - 11/21/22 1516     Visit Number 2    Number of Visits 18    Date for PT Re-Evaluation 01/31/23    Authorization Type BC[employer]    PT Start Time 1516    PT Stop Time 1555    PT Time Calculation (min) 39 min    Activity Tolerance Patient limited by pain    Behavior During Therapy [facility] for tasks assessed/performed             Past Medical History:  Diagnosis Date   Abnormal stress test    a. 09/2008, ST depression on ETT @ [facility], no further w/u. // Cath 8/14: no CAD   Adenomatous colon polyp    Anxiety and depression    Aortic insufficiency    a. 06/2012 Echo: EF 55-60%, Gr 1 DD, Mild AI, Triv MR, mildly dil LA, nl RV fxn. // Echo 03/2019: EF 60-65, normal RVSF, mod AI, trace MR, trivial TR, Asc Aorta 38 mm, GLS -18.1%   Enlarged thoracic aorta (HCC)    MRA 4/19: 4.1 cm // CT 09/2019: 4 cm // Chest CTA 5/22: Ascending aorta 3.8 cm    History of melanoma    Followed with Dr. Danella Deis   HTN (hypertension)    Hyperlipidemia    Leukocytosis    Mild   Migraines    Osteopenia    Paroxysmal atrial fibrillation (HCC)    admx in 09/2019 w/ choledocho lithiasis >> ERCP // several days later presented to ED with AF with RVR >> DCCV in ED unsuccessful; CHADS-VASc 4 >> Apixaban started for at least 1 month - ?? long term Rx   Varicose veins    Past Surgical History:  Procedure Laterality Date   BTL     CHOLECYSTECTOMY     DILATION AND CURETTAGE OF UTERUS     ERCP N/A 09/17/2019   Procedure: ENDOSCOPIC RETROGRADE CHOLANGIOPANCREATOGRAPHY (ERCP);  Surgeon: Kerin Salen, MD;  Location[facility]COPY;  Service: Gastroenterology;  Laterality: N/A;   FEMORAL HERNIA REPAIR     High endocervical polyp removed     IR EMBO VENOUS NOT HEMORR HEMANG  INC GUIDE ROADMAPPING  07/20/2020   LIPOMA EXCISION  Right 03/23/2015   Procedure: EXCISION LARGE LIPOMA RIGHT UPPER ARM  ;  Surgeon: Eloise Levels, MD;  Location: [facility];  Service: Plastics;  Laterality: Right;   MELANOMA EXCISION  2003   right calf   OVARIAN CYST REMOVAL     REMOVAL OF STONES  09/17/2019   Procedure: REMOVAL OF STONES;  Surgeon: Kerin Salen, MD;  Location: [facility] ENDOSCOPY;  Service: Gastroenterology;;   Dennison Mascot  09/17/2019   Procedure: SPHINCTEROTOMY;  Surgeon: Kerin Salen, MD;  Location: [facility] ENDOSCOPY;  Service: Gastroenterology;;   Patient Active Problem List   Diagnosis Date Noted   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/08/2022   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   Paroxysmal atrial fibrillation (HCC)    Anxiety and depression    Choledocholithiasis    Elevated liver enzymes    Hypotension    Osteopenia    Leukocytopenia 06/23/2014   Pure hypercholesterolemia 10/12/2009   HYPERTENSION, BENIGN 10/12/2009   Aortic regurgitation 10/12/2009   ANXIETY DEPRESSION 10/08/2009   GERD 10/08/2009   PMS 10/08/2009   Disorder of bone and cartilage 10/08/2009  COLONIC POLYPS, ADENOMATOUS, HX OF 10/08/2009   MIGRAINES, HX OF 10/08/2009    PCP: Merri Brunette, MD  REFERRING PROVIDER:   Rodolph Bong, MD    REFERRING DIAG:  [phone] (ICD-10-CM) - Chronic pain of right knee  M25.552 (ICD-10-CM) - Left hip pain  M25.511,G89.29 (ICD-10-CM) - Chronic right shoulder pain    THERAPY DIAG:  Difficulty in walking, not elsewhere classified  Muscle weakness (generalized)  Chronic pain of right knee  Rationale for Evaluation and Treatment: Rehabilitation  ONSET DATE: chronic knee and hip pain  SUBJECTIVE:   SUBJECTIVE STATEMENT: 11/21/2022 States that she has been having discomfort in the back of her knee in the AM. States she massages it and it feels better. States that she has been wearing her compression garments  some.  Eval: States she went to see Dr. Denyse Amass because she was having left hip  pain. States her knee was bothering her a little so she had a knee injection on 10/24/22 and it gradually got worse. States that she could barely walk. States she felt like her knee was in excruciating pain and that it was stiff and swollen. States she f/u with MD and xrays showed arthritis.   States she has no hip pain now.  Recent injection in knee increased knee pain.  PERTINENT HISTORY: pseudo gout PAIN:  Are you having pain? Yes: NPRS scale: 5/10 Pain location: right anterior knee  Pain description: achy and tender Aggravating factors: moving, weightbearing Relieving factors: rest   PRECAUTIONS: None  WEIGHT BEARING RESTRICTIONS: No  FALLS:  Has patient fallen in last 6 months? 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 Moderate-to-severe degenerative changes of the left hip joint.  Right knee FINDINGS: Moderate to severe tricompartmental degenerative changes. Loose bodies in the joint. No joint effusion. Enthesopathic changes off the superior patella. Chondrocalcinosis.   IMPRESSION: 1. Moderate to severe tricompartmental degenerative changes. 2. Loose bodies in the joint. 3. Chondrocalcinosis.   COGNITION: Overall cognitive status: Within functional limits for tasks assessed     SENSATION: Not tested  EDEMA:   R knee joint 15.25 inches --> 14.75 inches after edema massage Left knee joint 14.5 inches  R calf 15.0 inches L calf 14.75 inches   POSTURE: forward head and flexed trunk   PALPATION: Tenderness to light palpation throughout right lower extremity    LE Measurements Lower Extremity Right EVAL Left EVAL   A/PROM MMT  A/PROM MMT  Hip Flexion      Hip Extension      Hip Abduction      Hip Adduction      Hip Internal rotation      Hip External rotation      Knee Flexion 130 4 120 4  Knee Extension 0 4 25 (lacking) 4-  Ankle Dorsiflexion      Ankle Plantarflexion      Ankle Inversion      Ankle Eversion       (Blank rows = not tested) * pain   GAIT: Distance walked: 25 feet within clinic Assistive device utilized: None Level of assistance: Modified independence Comments: Antalgic limited knee and hip motion on right   TODAY'S TREATMENT:  DATE:   11/21/2022  Therapeutic Exercise:  Aerobic: Supine: heel slides 2x15 5" holds R, bridges 4x6  Prone: TKE stretch x2 30" holds R, hamstring curls 2x15 R   Seated: LAQs 2x10 R 5" holds  Standing: Neuromuscular Re-education: Manual Therapy: edema massage R 10 minutes  Therapeutic Activity: Self Care: Trigger Point Dry Needling:  Modalities:    PATIENT EDUCATION:  Education details: on HEP Person educated: Patient Education method: Explanation, Facilities manager, and Handouts Education comprehension: verbalized understanding   HOME EXERCISE PROGRAM: [id number]  ASSESSMENT:  CLINICAL IMPRESSION: 11/21/2022 Progressed exercises and added new exercises to HEP. Slight catch in knee with unsupported heel slides. This resolved with practice and support. Fatigue in legs but no increase in pain. Overall tolerated exercises well and reduced swelling on this date. Slight light- headedness with transition from supine to seated, this resolved with water and seated rest break. Will continue with current POC as tolerated.   Eval: Patient presents to physical therapy with complaints of chronic right knee pain with recent acute exacerbation of right knee pain.  Overall pain has improved since episode started but continues to be swollen and  painful.  Educated patient on strategies to reduce swelling and improve overall knee mobility and reduce pain.  Stressed importance of compliance with compression garments.  Patient would greatly benefit from skilled physical therapy to improve overall function, reduce pain and improve knee mobility.  OBJECTIVE IMPAIRMENTS: Abnormal gait, decreased activity tolerance, decreased balance, decreased endurance, decreased knowledge of use of DME, decreased mobility, difficulty walking, decreased ROM, decreased strength, increased edema, postural dysfunction, and pain.   ACTIVITY LIMITATIONS: bending, standing, squatting, sleeping, stairs, and locomotion level  PARTICIPATION LIMITATIONS: meal prep, cleaning, and community activity  PERSONAL FACTORS: Fitness and Time since onset of injury/illness/exacerbation are also affecting patient's functional outcome.   REHAB POTENTIAL: Good  CLINICAL DECISION MAKING: Stable/uncomplicated  EVALUATION COMPLEXITY: Low   GOALS: Goals reviewed with patient? yes  SHORT TERM GOALS: Target date: 12/20/2022  Patient will be independent in self management strategies to improve quality of life and functional outcomes. Baseline: New Program Goal status: INITIAL  2.  Patient will report at least 50% improvement in overall symptoms and/or function to demonstrate improved functional mobility Baseline: 0% better Goal status: INITIAL  3.  Patient will report wearing compression garments regularly to reduce swelling in legs Baseline: Not currently wearing Goal status: INITIAL     LONG TERM GOALS: Target date: 01/31/2023   Patient will report at least 75% improvement in overall symptoms and/or function to demonstrate improved functional mobility Baseline: 0% better Goal status: INITIAL  2.  Patient will be able to ambulate without pain in right knee to improve household mobility Baseline: Painful Goal status: INITIAL  3.  Swelling will regularly be within  half an inch of left leg to demonstrate reduced swelling in right lower extremity Baseline: See above Goal status: INITIAL      PLAN:  PT FREQUENCY: 1-2x/week for total of 18 visits over 12 weeks certification period  PT DURATION: 12 weeks  PLANNED INTERVENTIONS: Therapeutic exercises, Therapeutic activity, Neuromuscular re-education, Balance training, Gait training, Patient/Family education, Self Care, Joint mobilization, Joint manipulation, Stair training, Vestibular training, Canalith repositioning, Orthotic/Fit training, Prosthetic training, DME instructions, Aquatic Therapy, Dry Needling, Electrical stimulation, Spinal manipulation, Spinal mobilization, Cryotherapy, Moist heat, Taping, Traction, Ultrasound, Ionotophoresis 4mg /ml Dexamethasone, Manual therapy, and Re-evaluation.   PLAN FOR NEXT SESSION: Edema management, follow-up with compression garments, lower extremity strength and mobility possibly educated on use of  cane when knee agitated   3:56 PM, 11/21/22 Tereasa Coop, DPT Physical Therapy with [facility]

## 2022-11-25 ENCOUNTER — Other Ambulatory Visit: Payer: Self-pay

## 2022-11-25 ENCOUNTER — Emergency Department (HOSPITAL_BASED_OUTPATIENT_CLINIC_OR_DEPARTMENT_OTHER)
Admission: EM | Admit: 2022-11-25 | Discharge: 2022-11-25 | Disposition: A | Discharge status: Home / Self Care | Payer: Medicare Other | Attending: Emergency Medicine | Admitting: Emergency Medicine

## 2022-11-25 ENCOUNTER — Encounter (HOSPITAL_BASED_OUTPATIENT_CLINIC_OR_DEPARTMENT_OTHER): Payer: Self-pay | Admitting: Emergency Medicine

## 2022-11-25 DIAGNOSIS — S0990XA Unspecified injury of head, initial encounter: Secondary | ICD-10-CM | POA: Diagnosis not present

## 2022-11-25 DIAGNOSIS — R609 Edema, unspecified: Secondary | ICD-10-CM

## 2022-11-25 DIAGNOSIS — I4891 Unspecified atrial fibrillation: Secondary | ICD-10-CM | POA: Diagnosis not present

## 2022-11-25 DIAGNOSIS — Z7901 Long term (current) use of anticoagulants: Secondary | ICD-10-CM | POA: Diagnosis not present

## 2022-11-25 DIAGNOSIS — R229 Localized swelling, mass and lump, unspecified: Secondary | ICD-10-CM | POA: Diagnosis not present

## 2022-11-25 DIAGNOSIS — W228XXA Striking against or struck by other objects, initial encounter: Secondary | ICD-10-CM | POA: Diagnosis not present

## 2022-11-25 NOTE — ED Notes (Signed)
Discharge instructions and follow up care reviewed and explained, pt verbalized understanding and had no further questions on d/c. Pt caox4, ambulatory, NAD on d/c. 

## 2022-11-25 NOTE — ED Triage Notes (Signed)
Pt via pov from home with injury to her left temple; she was in the floor and raised up, hitting her head on the edge of her coffee table. Pt denies loc; no bleeding; is on eliquis. Pt states she has had a headache since then. Pt alert & oriented, nad noted.

## 2022-11-25 NOTE — ED Provider Notes (Signed)
[place] EMERGENCY DEPARTMENT AT [facility] Provider Note   CSN: [id number] Arrival date & time: 11/25/22  1000     History  Chief Complaint  Patient presents with   Head Injury    Kristine Jones is a 72 y.o. female.  Patient is a 72 year old female with past medical history of paroxysmal A-fib on Eliquis presenting for blunt head trauma.  Patient states she was under a a glass coffee table when she lifted up hitting her left pleural region at 10 PM last night.  She admits to some swelling and discomfort in the region.  She denies any sensation or motor deficits.  She denies any nausea or vomiting.  She denies any neck pain.  She was recommended by her primary care physician to come to emergency department for clearance.  The history is provided by the patient. No language interpreter was used.  Head Injury Associated symptoms: no seizures and no vomiting        Home Medications Prior to Admission medications   Medication Sig Start Date End Date Taking? Authorizing Provider  apixaban (ELIQUIS) 5 MG TABS tablet TAKE 1 TABLET BY MOUTH TWICE A DAY 09/06/22   Kathleene Hazel, MD  atenolol (TENORMIN) 25 MG tablet Take 1 tablet (25 mg total) by mouth daily as needed (for rapid palpitations). 11/08/22 02/06/23  Tereso Newcomer T, PA-C  Calcium Carbonate-Vitamin D (CALCIUM-VITAMIN D3) 600-200 MG-UNIT TABS Take 2 tablets by mouth daily.    [provider]  Cholecalciferol (VITAMIN D) 1000 UNITS capsule Take 1,000 Units by mouth daily.    [provider]  denosumab (PROLIA) 60 MG/ML SOSY injection See admin instructions.    [provider]  diltiazem (CARDIZEM CD) 120 MG 24 hr capsule TAKE ONE CAPSULE BY MOUTH EVERY EVENING TAKE IN ADDITION WITH 180 MG 10/24/22   Kathleene Hazel, MD  diltiazem (CARDIZEM CD) 180 MG 24 hr capsule Take 1 capsule (180 mg total) by mouth daily. Please keep scheduled appointment for future refills. Thank you.  09/25/22   Kathleene Hazel, MD  estradiol (ESTRACE) 0.1 MG/GM vaginal cream Place 1 Applicatorful vaginally as needed (vaginal discomfort).     [provider]  fish oil-omega-3 fatty acids 1000 MG capsule Take 1 g by mouth daily.    [provider]  irbesartan (AVAPRO) 75 MG tablet TAKE 1 TABLET BY MOUTH DAILY 10/24/22   Kathleene Hazel, MD  multivitamin [facility]) per tablet Take 1 tablet by mouth daily.    [provider]  sertraline (ZOLOFT) 50 MG tablet Take 1 1/2 tablet by mouth (75 mg total) daily    [provider]      Allergies    Indocin [indomethacin], Ibuprofen, Lisinopril, Naproxen, Naproxen sodium, Penicillins, Triamterene, and Penicillin g    Review of Systems   Review of Systems  Constitutional:  Negative for chills and fever.  HENT:  Negative for ear pain and sore throat.   Eyes:  Negative for pain and visual disturbance.  Respiratory:  Negative for cough and shortness of breath.   Cardiovascular:  Negative for chest pain and palpitations.  Gastrointestinal:  Negative for abdominal pain and vomiting.  Genitourinary:  Negative for dysuria and hematuria.  Musculoskeletal:  Negative for arthralgias and back pain.  Skin:  Negative for color change and rash.  Neurological:  Negative for seizures and syncope.  All other systems reviewed and are negative.   Physical Exam Updated Vital Signs BP (!) 148/73 (BP Location: Right  Arm)   Pulse 79   Temp 98.6 F (37 C)   Resp 17   Ht 5\' 9"  (1.753 m)   Wt 69.4 kg   SpO2 99%   BMI 22.59 kg/m  Physical Exam Vitals and nursing note reviewed.  Constitutional:      General: She is not in acute distress.    Appearance: She is well-developed.  HENT:     Head: Normocephalic.   Eyes:     General: Lids are normal. Vision grossly intact.     Extraocular Movements: Extraocular movements intact.     Conjunctiva/sclera: Conjunctivae normal.     Pupils: Pupils are equal, round,  and reactive to light.  Cardiovascular:     Rate and Rhythm: Normal rate and regular rhythm.     Heart sounds: No murmur heard. Pulmonary:     Effort: Pulmonary effort is normal. No respiratory distress.     Breath sounds: Normal breath sounds.  Abdominal:     Palpations: Abdomen is soft.     Tenderness: There is no abdominal tenderness.  Musculoskeletal:        General: No swelling.     Cervical back: Neck supple.  Skin:    General: Skin is warm and dry.     Capillary Refill: Capillary refill takes less than 2 seconds.  Neurological:     Mental Status: She is alert and oriented to person, place, and time.     GCS: GCS eye subscore is 4. GCS verbal subscore is 5. GCS motor subscore is 6.     Cranial Nerves: Cranial nerves 2-12 are intact.     Sensory: Sensation is intact.     Motor: Motor function is intact.     Coordination: Coordination is intact.     Gait: Gait is intact.  Psychiatric:        Mood and Affect: Mood normal.     ED Results / Procedures / Treatments   Labs (all labs ordered are listed, but only abnormal results are displayed) Labs Reviewed - No data to display  EKG None  Radiology No results found.  Procedures Procedures    Medications Ordered in ED Medications - No data to display  ED Course/ Medical Decision Making/ A&P                             Medical Decision Making  53:76 PM 72 year old female with past medical history of paroxysmal A-fib on Eliquis presenting for blunt head trauma.  Patient is alert and oriented x 3, no acute distress, afebrile, stable vital signs.  Physical exam demonstrates no neurovascular deficits.  Patient has minimal swelling on the left zygomatic arch.  No bleeding.  No ecchymosis.  No lacerations.  We discussed CT imaging of the brain to rule out hemorrhage secondary to blood thinner use however, at this time, patient is currently 14 hours post injury.  At I have a low concern for any intracranial abnormalities at  this time.  Shared patient decision making we have decided to forego CT imaging at this time.  Patient given strict return precautions if any neurological symptoms, vomiting, or severe headaches develop.     Patient in no distress and overall condition improved here in the ED. Detailed discussions were had with the patient regarding current findings, and need for close f/u with PCP or on call doctor. The patient has been instructed to return immediately if the symptoms worsen in any  way for re-evaluation. Patient verbalized understanding and is in agreement with current care plan. All questions answered prior to discharge.         Final Clinical Impression(s) / ED Diagnoses Final diagnoses:  Injury of head, initial encounter  Swelling    Rx / DC Orders ED Discharge Orders     None         Franne Forts, DO 11/25/22 1210

## 2022-11-28 ENCOUNTER — Encounter: Payer: Self-pay | Admitting: Physical Therapy

## 2022-11-28 ENCOUNTER — Ambulatory Visit: Payer: Medicare Other | Admitting: Physical Therapy

## 2022-11-28 DIAGNOSIS — M25561 Pain in right knee: Secondary | ICD-10-CM | POA: Diagnosis not present

## 2022-11-28 DIAGNOSIS — G8929 Other chronic pain: Secondary | ICD-10-CM | POA: Diagnosis not present

## 2022-11-28 DIAGNOSIS — R262 Difficulty in walking, not elsewhere classified: Secondary | ICD-10-CM

## 2022-11-28 DIAGNOSIS — M6281 Muscle weakness (generalized): Secondary | ICD-10-CM | POA: Diagnosis not present

## 2022-11-28 NOTE — Therapy (Signed)
OUTPATIENT PHYSICAL THERAPY LOWER EXTREMITY Treatment   Patient Name: Kristine Jones MRN: [id number] DOB:1951-02-15, 72 y.o., female Today's Date: 11/28/2022  END OF SESSION:  PT End of Session - 11/28/22 1521     Visit Number 3    Number of Visits 18    Date for PT Re-Evaluation 01/31/23    Authorization Type BCBS    PT Start Time 1520    PT Stop Time 1558    PT Time Calculation (min) 38 min    Activity Tolerance Patient limited by pain    Behavior During Therapy [facility] for tasks assessed/performed             Past Medical History:  Diagnosis Date   Abnormal stress test    a. 09/2008, ST depression on ETT @ [facility], no further w/u. // Cath 8/14: no CAD   Adenomatous colon polyp    Anxiety and depression    Aortic insufficiency    a. 06/2012 Echo: EF 55-60%, Gr 1 DD, Mild AI, Triv MR, mildly dil LA, nl RV fxn. // Echo 03/2019: EF 60-65, normal RVSF, mod AI, trace MR, trivial TR, Asc Aorta 38 mm, GLS -18.1%   Enlarged thoracic aorta (HCC)    MRA 4/19: 4.1 cm // CT 09/2019: 4 cm // Chest CTA 5/22: Ascending aorta 3.8 cm    History of melanoma    Followed with Dr. Danella Deis   HTN (hypertension)    Hyperlipidemia    Leukocytosis    Mild   Migraines    Osteopenia    Paroxysmal atrial fibrillation (HCC)    admx in 09/2019 w/ choledocho lithiasis >> ERCP // several days later presented to ED with AF with RVR >> DCCV in ED unsuccessful; CHADS-VASc 4 >> Apixaban started for at least 1 month - ?? long term Rx   Varicose veins    Past Surgical History:  Procedure Laterality Date   BTL     CHOLECYSTECTOMY     DILATION AND CURETTAGE OF UTERUS     ERCP N/A 09/17/2019   Procedure: ENDOSCOPIC RETROGRADE CHOLANGIOPANCREATOGRAPHY (ERCP);  Surgeon: Kerin Salen, MD;  Location: [facility] ENDOSCOPY;  Service: Gastroenterology;  Laterality: N/A;   FEMORAL HERNIA REPAIR     High endocervical polyp removed     IR EMBO VENOUS NOT HEMORR HEMANG  INC GUIDE ROADMAPPING  07/20/2020   LIPOMA  EXCISION Right 03/23/2015   Procedure: EXCISION LARGE LIPOMA RIGHT UPPER ARM  ;  Surgeon: Eloise Levels, MD;  Location: [facility];  Service: Plastics;  Laterality: Right;   MELANOMA EXCISION  2003   right calf   OVARIAN CYST REMOVAL     REMOVAL OF STONES  09/17/2019   Procedure: REMOVAL OF STONES;  Surgeon: Kerin Salen, MD;  Location: [facility] ENDOSCOPY;  Service: Gastroenterology;;   Dennison Mascot  09/17/2019   Procedure: SPHINCTEROTOMY;  Surgeon: Kerin Salen, MD;  Location: [facility] ENDOSCOPY;  Service: Gastroenterology;;   Patient Active Problem List   Diagnosis Date Noted   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/08/2022   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   Paroxysmal atrial fibrillation (HCC)    Anxiety and depression    Choledocholithiasis    Elevated liver enzymes    Hypotension    Osteopenia    Leukocytopenia 06/23/2014   Pure hypercholesterolemia 10/12/2009   HYPERTENSION, BENIGN 10/12/2009   Aortic regurgitation 10/12/2009   ANXIETY DEPRESSION 10/08/2009   GERD 10/08/2009   PMS 10/08/2009   Disorder of bone and cartilage 10/08/2009  COLONIC POLYPS, ADENOMATOUS, HX OF 10/08/2009   MIGRAINES, HX OF 10/08/2009    PCP: Merri Brunette, MD  REFERRING PROVIDER:   Rodolph Bong, MD    REFERRING DIAG:  [phone] (ICD-10-CM) - Chronic pain of right knee  M25.552 (ICD-10-CM) - Left hip pain  M25.511,G89.29 (ICD-10-CM) - Chronic right shoulder pain    THERAPY DIAG:  Difficulty in walking, not elsewhere classified  Muscle weakness (generalized)  Chronic pain of right knee  Rationale for Evaluation and Treatment: Rehabilitation  ONSET DATE: chronic knee and hip pain  SUBJECTIVE:   SUBJECTIVE STATEMENT: 11/28/2022 States Sunday morning her left knee in the back started hurting. States it started hurting and she couldn't rest because she had increased pain.  Pain at night and woke her up. States she had to get ice and it feels painful in the back.   Eval: States  she went to see Dr. Denyse Amass because she was having left hip pain. States her knee was bothering her a little so she had a knee injection on 10/24/22 and it gradually got worse. States that she could barely walk. States she felt like her knee was in excruciating pain and that it was stiff and swollen. States she f/u with MD and xrays showed arthritis.   States she has no hip pain now.  Recent injection in knee increased knee pain.  PERTINENT HISTORY: pseudo gout PAIN:  Are you having pain? Yes: NPRS scale: 6/10 Pain location: left posterior knee  Pain description: achy and tender Aggravating factors: moving, weightbearing Relieving factors: rest   PRECAUTIONS: None  WEIGHT BEARING RESTRICTIONS: No  FALLS:  Has patient fallen in last 6 months? 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 Moderate-to-severe degenerative changes of the left hip joint.  Right knee FINDINGS: Moderate to severe tricompartmental degenerative changes. Loose bodies in the joint. No joint effusion. Enthesopathic changes off the superior patella. Chondrocalcinosis.   IMPRESSION: 1. Moderate to severe tricompartmental degenerative changes. 2. Loose bodies in the joint. 3. Chondrocalcinosis.   COGNITION: Overall cognitive status: Within functional limits for tasks assessed     SENSATION: Not tested  EDEMA:   R knee joint 15.25 inches --> 14.75 inches after edema massage Left knee joint 14.5 inches  R calf 15.0 inches L calf 14.75 inches   POSTURE: forward head and flexed trunk   PALPATION: Tenderness to light palpation throughout right lower extremity    LE  Measurements Lower Extremity Right EVAL Left EVAL   A/PROM MMT A/PROM MMT  Hip Flexion      Hip Extension      Hip Abduction      Hip Adduction      Hip Internal rotation      Hip External rotation      Knee Flexion 130 4 120 4  Knee Extension 0 4 25 (lacking) 4-  Ankle Dorsiflexion      Ankle Plantarflexion      Ankle Inversion      Ankle Eversion       (Blank rows = not tested) * pain   GAIT: Distance walked: 25 feet within clinic Assistive device utilized: None Level of assistance: Modified independence Comments: Antalgic limited  knee and hip motion on right   TODAY'S TREATMENT:                                                                                                                              DATE:   11/28/2022  Therapeutic Exercise:  Aerobic:bike rocking back and forth with no resistance  3 minutes Supine: heel slides 2x15 5" holds L, ankle pumps x30 5" holds, SAQs 3x10 B   Prone: T   Seated: LAQs 3x10 R 5" holds  Standing: Neuromuscular Re-education: Manual Therapy:IASTM to left knee with percussion gun for vibration 15 minutes, tibial mobilization grade II for pain  Therapeutic Activity: Self Care: Trigger Point Dry Needling:  Modalities:    PATIENT EDUCATION:  Education details: on HEP, educated patient on use of cane as needed to reduce stress on knee. Person educated: Patient Education method: Explanation, Demonstration, and Handouts Education comprehension: verbalized understanding   HOME EXERCISE PROGRAM: [id number]  ASSESSMENT:  CLINICAL IMPRESSION: 11/28/2022 Focused on left knee today secondary to recent increase in pain and mobility after standing a lot on Sunday. Tolerated percussion gun well reported reduced pain after vibration intervention. Added gentle table exercises which were tolerated well. Discussed resting and taking it easy over the next week. Overall reduced tension noted end session.  Eval: Patient presents to physical  therapy with complaints of chronic right knee pain with recent acute exacerbation of right knee pain.  Overall pain has improved since episode started but continues to be swollen and painful.  Educated patient on strategies to reduce swelling and improve overall knee mobility and reduce pain.  Stressed importance of compliance with compression garments.  Patient would greatly benefit from skilled physical therapy to improve overall function, reduce pain and improve knee mobility.  OBJECTIVE IMPAIRMENTS: Abnormal gait, decreased activity tolerance, decreased balance, decreased endurance, decreased knowledge of use of DME, decreased mobility, difficulty walking, decreased ROM, decreased strength, increased edema, postural dysfunction, and pain.   ACTIVITY LIMITATIONS: bending, standing, squatting, sleeping, stairs, and locomotion level  PARTICIPATION LIMITATIONS: meal prep, cleaning, and community activity  PERSONAL FACTORS: Fitness and Time since onset of injury/illness/exacerbation are also affecting patient's functional outcome.   REHAB POTENTIAL: Good  CLINICAL DECISION MAKING: Stable/uncomplicated  EVALUATION COMPLEXITY: Low   GOALS: Goals reviewed with patient? yes  SHORT TERM GOALS: Target date: 12/20/2022  Patient will be independent in self management strategies to improve quality of life and functional outcomes. Baseline: New Program Goal status: INITIAL  2.  Patient will report at least 50% improvement in overall symptoms and/or function to demonstrate improved functional mobility Baseline: 0% better Goal status: INITIAL  3.  Patient will report wearing compression garments regularly to reduce swelling in legs Baseline: Not currently wearing Goal status: INITIAL     LONG TERM GOALS: Target date: 01/31/2023   Patient will report at least 75% improvement in overall symptoms and/or function to demonstrate improved functional mobility Baseline: 0% better Goal status:  INITIAL  2.  Patient will be able to ambulate without pain in right knee to improve household mobility Baseline: Painful Goal status: INITIAL  3.  Swelling will regularly be within half an inch of left leg to demonstrate reduced swelling in right lower extremity Baseline: See above Goal status: INITIAL      PLAN:  PT FREQUENCY: 1-2x/week for total of 18 visits over 12 weeks certification period  PT DURATION: 12 weeks  PLANNED INTERVENTIONS: Therapeutic exercises, Therapeutic activity, Neuromuscular re-education, Balance training, Gait training, Patient/Family education, Self Care, Joint mobilization, Joint manipulation, Stair training, Vestibular training, Canalith repositioning, Orthotic/Fit training, Prosthetic training, DME instructions, Aquatic Therapy, Dry Needling, Electrical stimulation, Spinal manipulation, Spinal mobilization, Cryotherapy, Moist heat, Taping, Traction, Ultrasound, Ionotophoresis 4mg /ml Dexamethasone, Manual therapy, and Re-evaluation.   PLAN FOR NEXT SESSION: Edema management, follow-up with compression garments, lower extremity strength and mobility possibly educated on use of cane when knee agitated   3:59 PM, 11/28/22 Tereasa Coop, DPT Physical Therapy with [facility]

## 2022-11-30 ENCOUNTER — Telehealth: Payer: Self-pay

## 2022-11-30 NOTE — Telephone Encounter (Signed)
Transition Care Management Unsuccessful Follow-up Telephone Call  Date of discharge and from where:  [facility] 7/13  Attempts:  1st Attempt  Reason for unsuccessful TCM follow-up call:  No answer/busy   Lenard Forth [facility] Guide, [facility] [phone] [address] [place], [place], [place] [address] Phone: [phone] Email: [email or web address]

## 2022-12-01 ENCOUNTER — Telehealth: Payer: Self-pay

## 2022-12-01 ENCOUNTER — Other Ambulatory Visit: Payer: Self-pay

## 2022-12-01 ENCOUNTER — Encounter: Payer: Self-pay | Admitting: Family Medicine

## 2022-12-01 ENCOUNTER — Ambulatory Visit: Payer: Medicare Other | Admitting: Family Medicine

## 2022-12-01 ENCOUNTER — Ambulatory Visit (INDEPENDENT_AMBULATORY_CARE_PROVIDER_SITE_OTHER): Payer: Medicare Other

## 2022-12-01 VITALS — BP 130/78 | HR 75 | Ht 69.0 in | Wt 159.0 lb

## 2022-12-01 DIAGNOSIS — M19071 Primary osteoarthritis, right ankle and foot: Secondary | ICD-10-CM | POA: Diagnosis not present

## 2022-12-01 DIAGNOSIS — G8929 Other chronic pain: Secondary | ICD-10-CM | POA: Diagnosis not present

## 2022-12-01 DIAGNOSIS — M25561 Pain in right knee: Secondary | ICD-10-CM | POA: Diagnosis not present

## 2022-12-01 DIAGNOSIS — M1712 Unilateral primary osteoarthritis, left knee: Secondary | ICD-10-CM | POA: Diagnosis not present

## 2022-12-01 DIAGNOSIS — M25571 Pain in right ankle and joints of right foot: Secondary | ICD-10-CM

## 2022-12-01 DIAGNOSIS — M25562 Pain in left knee: Secondary | ICD-10-CM

## 2022-12-01 DIAGNOSIS — M25462 Effusion, left knee: Secondary | ICD-10-CM | POA: Diagnosis not present

## 2022-12-01 NOTE — Progress Notes (Signed)
I, Stevenson Clinch, CMA acting as a scribe for [name], MD.  Kristine Jones is a 72 y.o. female who presents to [facility] today for f/u R knee pain. Pt was last seen by Dr. Denyse Amass on 11/02/22 and was prescribed colchicine, hydrocodone, and labs were obtained. Last R knee steroid injection was on June 11th.   Today, pt reports flare up  of left knee pain, started this past Sunday. Pain at posterior aspect radiating towards the front of the knee. Sx causing night disturbance. Had treatment with PT, used percussion instrument, that was very helpful for left knee. Notes continues stiffness in the right knee, though sx are well managed overall. Also notes pain and swelling in right achilles x 1 day.   Dx testing: 11/02/22 Labs 10/24/22 R knee XR 08/30/21 R knee XR  Pertinent review of systems: no fever or chills  Relevant historical information: History of steroid flair with right knee injection in June 2024   Exam:  BP 130/78   Pulse 75   Ht 5\' 9"  (1.753 m)   Wt 159 lb (72.1 kg)   SpO2 97%   BMI 23.48 kg/m  General: Well Developed, well nourished, and in no acute distress.   MSK: Left knee: Mild effusion.  Normal motion.  Intact strength.  Stable ligamentous exam.  TTP lateral joint line.   Right Ankle:  Mild swelling posterior lateral ankle.  TTP lateral and posterior ankle.  Normal motion.  Intact strength.     Lab and Radiology Results  Diagnostic Limited MSK Ultrasound of: Left knee and Right Ankle Left knee: Mild effusion. Intact and normal appearing QT and PT. Degeneration of lateral meniscus present.  Mild degeneration medial meniscus.  No baker cyst.   Right ankle: Peroneal tendons slightly enlarged without hypoechoic fluid in tendon sheath.  AT intact with some swelling of the tendon.  Hypoechoic fluid deep to the tendon c/w mild retro calcaneous bursitis.  Impression: Left knee: Degeneration lateral meniscus.  Right  ankle: Retrocancaneal bursitis.   Xray images left knee and right ankle personally and independently interpreted.   Left knee: Moderate to severe DJD. No acute fracture.   Right ankle: Mild ankle DJD. No acute fractures seen.   Await formal radiology review.    Assessment and Plan: 72 y.o. female with Left knee pain:  Much worse earlier this well now improving. Plan for watchful waiting and PT.  If not better consider injection. I am fearful of steroid injection as she had a steroid flair in the right knee last month with triamcinolone. Could use depomedrol if needed.   Right ankle: Retrocalanceal bursitis and AT tendonitis.  Plan for PT.    PDMP not reviewed this encounter. Orders Placed This Encounter  Procedures   [place] LIMITED JOINT SPACE STRUCTURES LOW BILAT(NO LINKED CHARGES)    Order Specific Question:   Reason for Exam (SYMPTOM  OR DIAGNOSIS REQUIRED)    Answer:   bilat knee pain    Order Specific Question:   Preferred imaging location?    Answer:   [facility] Knee AP/LAT W/[place] Left    Standing Status:   Future    Number of Occurrences:   1    Standing Expiration Date:   01/01/2023    Order Specific Question:   Reason for Exam (SYMPTOM  OR DIAGNOSIS REQUIRED)    Answer:   left knee pain    Order Specific Question:  Preferred imaging location?    Answer:   [facility]   DG Ankle Complete Right    Standing Status:   Future    Number of Occurrences:   1    Standing Expiration Date:   12/01/2023    Order Specific Question:   Reason for Exam (SYMPTOM  OR DIAGNOSIS REQUIRED)    Answer:   right ankle pain    Order Specific Question:   Preferred imaging location?    Answer:   [facility]   Ambulatory referral to Physical Therapy    Referral Priority:   Routine    Referral Type:   Physical Medicine    Referral Reason:   Specialty Services Required    Requested Specialty:   Physical Therapy    Number of Visits Requested:    1   No orders of the defined types were placed in this encounter.    Discussed warning signs or symptoms. Please see discharge instructions. Patient expresses understanding.   The above documentation has been reviewed and is accurate and complete [name], M.D.

## 2022-12-01 NOTE — Telephone Encounter (Signed)
Transition Care Management Unsuccessful Follow-up Telephone Call  Date of discharge and from where:  [facility] 7/13  Attempts:  2nd Attempt  Reason for unsuccessful TCM follow-up call:  No answer/busy   Lenard Forth [facility] Guide, [facility] [phone] [address] [place], [place], [place] [address] Phone: [phone] Email: [email or web address]

## 2022-12-01 NOTE — Patient Instructions (Addendum)
Thank you for coming in today.   Please get an Xray today before you leave   I've referred you to Physical Therapy.  Let us know if you don't hear from them in one week.   Check back as needed 

## 2022-12-04 ENCOUNTER — Other Ambulatory Visit: Payer: Self-pay | Admitting: Cardiovascular Disease

## 2022-12-04 DIAGNOSIS — H5212 Myopia, left eye: Secondary | ICD-10-CM | POA: Diagnosis not present

## 2022-12-04 DIAGNOSIS — I4891 Unspecified atrial fibrillation: Secondary | ICD-10-CM

## 2022-12-04 NOTE — Telephone Encounter (Signed)
Eliquis 5mg  refill request received. Patient is 72 years old, weight-72.1kg, Crea-0.64 on 08/10/22 via [employer] from [employer], [place], and last seen by Tereso Newcomer on 11/08/22. Dose is appropriate based on dosing criteria. Will send in refill to requested pharmacy.

## 2022-12-05 ENCOUNTER — Ambulatory Visit: Payer: Medicare Other | Admitting: Family Medicine

## 2022-12-06 ENCOUNTER — Ambulatory Visit: Payer: Medicare Other | Admitting: Physical Therapy

## 2022-12-06 ENCOUNTER — Encounter: Payer: Medicare Other | Admitting: Physical Therapy

## 2022-12-06 DIAGNOSIS — G8929 Other chronic pain: Secondary | ICD-10-CM | POA: Diagnosis not present

## 2022-12-06 DIAGNOSIS — M6281 Muscle weakness (generalized): Secondary | ICD-10-CM

## 2022-12-06 DIAGNOSIS — M25561 Pain in right knee: Secondary | ICD-10-CM

## 2022-12-06 DIAGNOSIS — R262 Difficulty in walking, not elsewhere classified: Secondary | ICD-10-CM

## 2022-12-06 NOTE — Therapy (Signed)
OUTPATIENT PHYSICAL THERAPY LOWER EXTREMITY Treatment   Patient Name: Kristine Jones MRN: [id number] DOB:05/01/1951, 72 y.o., female Today's Date: 12/06/2022  END OF SESSION:  PT End of Session - 12/06/22 0930     Visit Number 4    Number of Visits 18    Date for PT Re-Evaluation 01/31/23    Authorization Type BCBS    PT Start Time [phone]    PT Stop Time 1012    PT Time Calculation (min) 41 min    Activity Tolerance Patient limited by pain    Behavior During Therapy [facility] for tasks assessed/performed             Past Medical History:  Diagnosis Date   Abnormal stress test    a. 09/2008, ST depression on ETT @ [facility], no further w/u. // Cath 8/14: no CAD   Adenomatous colon polyp    Anxiety and depression    Aortic insufficiency    a. 06/2012 Echo: EF 55-60%, Gr 1 DD, Mild AI, Triv MR, mildly dil LA, nl RV fxn. // Echo 03/2019: EF 60-65, normal RVSF, mod AI, trace MR, trivial TR, Asc Aorta 38 mm, GLS -18.1%   Enlarged thoracic aorta (HCC)    MRA 4/19: 4.1 cm // CT 09/2019: 4 cm // Chest CTA 5/22: Ascending aorta 3.8 cm    History of melanoma    Followed with Dr. Danella Deis   HTN (hypertension)    Hyperlipidemia    Leukocytosis    Mild   Migraines    Osteopenia    Paroxysmal atrial fibrillation (HCC)    admx in 09/2019 w/ choledocho lithiasis >> ERCP // several days later presented to ED with AF with RVR >> DCCV in ED unsuccessful; CHADS-VASc 4 >> Apixaban started for at least 1 month - ?? long term Rx   Varicose veins    Past Surgical History:  Procedure Laterality Date   BTL     CHOLECYSTECTOMY     DILATION AND CURETTAGE OF UTERUS     ERCP N/A 09/17/2019   Procedure: ENDOSCOPIC RETROGRADE CHOLANGIOPANCREATOGRAPHY (ERCP);  Surgeon: Kerin Salen, MD;  Location: [facility] ENDOSCOPY;  Service: Gastroenterology;  Laterality: N/A;   FEMORAL HERNIA REPAIR     High endocervical polyp removed     IR EMBO VENOUS NOT HEMORR HEMANG  INC GUIDE ROADMAPPING  07/20/2020   LIPOMA  EXCISION Right 03/23/2015   Procedure: EXCISION LARGE LIPOMA RIGHT UPPER ARM  ;  Surgeon: Eloise Levels, MD;  Location: [facility];  Service: Plastics;  Laterality: Right;   MELANOMA EXCISION  2003   right calf   OVARIAN CYST REMOVAL     REMOVAL OF STONES  09/17/2019   Procedure: REMOVAL OF STONES;  Surgeon: Kerin Salen, MD;  Location: [facility] ENDOSCOPY;  Service: Gastroenterology;;   Dennison Mascot  09/17/2019   Procedure: SPHINCTEROTOMY;  Surgeon: Kerin Salen, MD;  Location: [facility] ENDOSCOPY;  Service: Gastroenterology;;   Patient Active Problem List   Diagnosis Date Noted   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/08/2022   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   Paroxysmal atrial fibrillation (HCC)    Anxiety and depression    Choledocholithiasis    Elevated liver enzymes    Hypotension    Osteopenia    Leukocytopenia 06/23/2014   Pure hypercholesterolemia 10/12/2009   HYPERTENSION, BENIGN 10/12/2009   Aortic regurgitation 10/12/2009   ANXIETY DEPRESSION 10/08/2009   GERD 10/08/2009   PMS 10/08/2009   Disorder of bone and cartilage 10/08/2009  COLONIC POLYPS, ADENOMATOUS, HX OF 10/08/2009   MIGRAINES, HX OF 10/08/2009    PCP: Merri Brunette, MD  REFERRING PROVIDER:   Rodolph Bong, MD    REFERRING DIAG:  [phone] (ICD-10-CM) - Chronic pain of right knee  M25.552 (ICD-10-CM) - Left hip pain  M25.511,G89.29 (ICD-10-CM) - Chronic right shoulder pain    THERAPY DIAG:  Difficulty in walking, not elsewhere classified  Muscle weakness (generalized)  Chronic pain of right knee  Rationale for Evaluation and Treatment: Rehabilitation  ONSET DATE: chronic knee and hip pain  SUBJECTIVE:   SUBJECTIVE STATEMENT: 12/06/2022 States that the day after her last session her left knee got better. States they f/u'd with MD and they took xrays of her left knee and ankle. States she has had some pain in her left knee   Eval: States she went to see Dr. Denyse Amass because she was  having left hip pain. States her knee was bothering her a little so she had a knee injection on 10/24/22 and it gradually got worse. States that she could barely walk. States she felt like her knee was in excruciating pain and that it was stiff and swollen. States she f/u with MD and xrays showed arthritis.   States she has no hip pain now.  Recent injection in knee increased knee pain.  PERTINENT HISTORY: pseudo gout PAIN:  Are you having pain? Yes: NPRS scale: 4/10 Pain location: left posterior knee  Pain description: achy and tender Aggravating factors: moving, weightbearing Relieving factors: rest   PRECAUTIONS: None  WEIGHT BEARING RESTRICTIONS: No  FALLS:  Has patient fallen in last 6 months? 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 Moderate-to-severe degenerative changes of the left hip joint.  Right knee FINDINGS: Moderate to severe tricompartmental degenerative changes. Loose bodies in the joint. No joint effusion. Enthesopathic changes off the superior patella. Chondrocalcinosis.   IMPRESSION: 1. Moderate to severe tricompartmental degenerative changes. 2. Loose bodies in the joint. 3. Chondrocalcinosis.   COGNITION: Overall cognitive status: Within functional limits for tasks assessed     SENSATION: Not tested  EDEMA:   R knee joint 15.25 inches --> 14.75 inches after edema massage Left knee joint 14.5 inches  R calf 15.0 inches L calf 14.75 inches   POSTURE: forward head and flexed trunk   PALPATION: Tenderness to light palpation throughout right lower extremity    LE Measurements Lower Extremity Right EVAL Left EVAL    A/PROM MMT A/PROM MMT  Hip Flexion      Hip Extension      Hip Abduction      Hip Adduction      Hip Internal rotation      Hip External rotation      Knee Flexion 130 4 120 4  Knee Extension 0 4 25 (lacking) 4-  Ankle Dorsiflexion      Ankle Plantarflexion      Ankle Inversion      Ankle Eversion       (Blank rows = not tested) * pain   GAIT: Distance walked: 25 feet within clinic Assistive device utilized: None Level of assistance: Modified independence Comments: Antalgic limited knee and hip motion on right  TODAY'S TREATMENT:                                                                                                                              DATE:   12/06/2022  Therapeutic Exercise:  Aerobic:bike L1 5 minutes Supine: bridges 3x10, heel slides 2x15 5" holds L, ankle pumps x30 5" holds, SAQs 3x10 B   Prone: hamstring curls 5x5 B  Seated: LAQs 2x10 B 5" holds  Standing: Neuromuscular Re-education: Manual Therapy:IASTM to left knee with percussion gun for vibration, left knee traction - tolerated well  Therapeutic Activity: Self Care: Trigger Point Dry Needling:  Modalities:    PATIENT EDUCATION:  Education details: on HEP, on rehab following TKA and different types of knee injections Person educated: Patient Education method: Explanation, Demonstration, and Handouts Education comprehension: verbalized understanding   HOME EXERCISE PROGRAM: [id number]  ASSESSMENT:  CLINICAL IMPRESSION: 12/06/2022 Patient with less pain compared to last session. Dicussed importance of mobility work and difference between different types of injections as well as typical post op rehab for knee replacement. Tolerated session well with reduced pinching pain after knee traction. Instructed patient to use bike regularly at home. Will continue with current POC as tolerated.   Eval: Patient presents to physical therapy with complaints of chronic right knee pain with recent acute  exacerbation of right knee pain.  Overall pain has improved since episode started but continues to be swollen and painful.  Educated patient on strategies to reduce swelling and improve overall knee mobility and reduce pain.  Stressed importance of compliance with compression garments.  Patient would greatly benefit from skilled physical therapy to improve overall function, reduce pain and improve knee mobility.  OBJECTIVE IMPAIRMENTS: Abnormal gait, decreased activity tolerance, decreased balance, decreased endurance, decreased knowledge of use of DME, decreased mobility, difficulty walking, decreased ROM, decreased strength, increased edema, postural dysfunction, and pain.   ACTIVITY LIMITATIONS: bending, standing, squatting, sleeping, stairs, and locomotion level  PARTICIPATION LIMITATIONS: meal prep, cleaning, and community activity  PERSONAL FACTORS: Fitness and Time since onset of injury/illness/exacerbation are also affecting patient's functional outcome.   REHAB POTENTIAL: Good  CLINICAL DECISION MAKING: Stable/uncomplicated  EVALUATION COMPLEXITY: Low   GOALS: Goals reviewed with patient? yes  SHORT TERM GOALS: Target date: 12/20/2022  Patient will be independent in self management strategies to improve quality of life and functional outcomes. Baseline: New Program Goal status: INITIAL  2.  Patient will report at least 50% improvement in overall symptoms and/or function to demonstrate improved functional mobility Baseline: 0% better Goal status: INITIAL  3.  Patient will report wearing compression garments regularly to reduce swelling in legs Baseline: Not currently wearing Goal status: INITIAL     LONG TERM GOALS: Target date: 01/31/2023   Patient will report at least 75% improvement in overall symptoms and/or function to demonstrate improved functional mobility Baseline: 0% better Goal status: INITIAL  2.  Patient will be able to ambulate without pain in right  knee  to improve household mobility Baseline: Painful Goal status: INITIAL  3.  Swelling will regularly be within half an inch of left leg to demonstrate reduced swelling in right lower extremity Baseline: See above Goal status: INITIAL      PLAN:  PT FREQUENCY: 1-2x/week for total of 18 visits over 12 weeks certification period  PT DURATION: 12 weeks  PLANNED INTERVENTIONS: Therapeutic exercises, Therapeutic activity, Neuromuscular re-education, Balance training, Gait training, Patient/Family education, Self Care, Joint mobilization, Joint manipulation, Stair training, Vestibular training, Canalith repositioning, Orthotic/Fit training, Prosthetic training, DME instructions, Aquatic Therapy, Dry Needling, Electrical stimulation, Spinal manipulation, Spinal mobilization, Cryotherapy, Moist heat, Taping, Traction, Ultrasound, Ionotophoresis 4mg /ml Dexamethasone, Manual therapy, and Re-evaluation.   PLAN FOR NEXT SESSION: Edema management, follow-up with compression garments, lower extremity strength and mobility possibly educated on use of cane when knee agitated   12:36 PM, 12/06/22 Tereasa Coop, DPT Physical Therapy with [facility]

## 2022-12-07 ENCOUNTER — Encounter: Payer: Medicare Other | Admitting: Physical Therapy

## 2022-12-07 DIAGNOSIS — N952 Postmenopausal atrophic vaginitis: Secondary | ICD-10-CM | POA: Diagnosis not present

## 2022-12-07 DIAGNOSIS — N393 Stress incontinence (female) (male): Secondary | ICD-10-CM | POA: Diagnosis not present

## 2022-12-07 DIAGNOSIS — Z01419 Encounter for gynecological examination (general) (routine) without abnormal findings: Secondary | ICD-10-CM | POA: Diagnosis not present

## 2022-12-11 NOTE — Progress Notes (Signed)
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

## 2022-12-11 NOTE — Progress Notes (Signed)
Left knee x-ray shows medium arthritis.

## 2022-12-12 ENCOUNTER — Encounter: Payer: Medicare Other | Admitting: Physical Therapy

## 2022-12-18 ENCOUNTER — Ambulatory Visit: Payer: Medicare Other | Admitting: Physical Therapy

## 2022-12-18 ENCOUNTER — Encounter: Payer: Self-pay | Admitting: Physical Therapy

## 2022-12-18 DIAGNOSIS — R262 Difficulty in walking, not elsewhere classified: Secondary | ICD-10-CM | POA: Diagnosis not present

## 2022-12-18 DIAGNOSIS — M25561 Pain in right knee: Secondary | ICD-10-CM

## 2022-12-18 DIAGNOSIS — M6281 Muscle weakness (generalized): Secondary | ICD-10-CM

## 2022-12-18 DIAGNOSIS — G8929 Other chronic pain: Secondary | ICD-10-CM | POA: Diagnosis not present

## 2022-12-18 NOTE — Therapy (Addendum)
 OUTPATIENT PHYSICAL THERAPY LOWER EXTREMITY Treatment PHYSICAL THERAPY DISCHARGE SUMMARY  Visits from Start of Care: 5  Current functional level related to goals / functional outcomes: Could not reassess due to unplanned discharged    Remaining deficits: Could not reassess due to unplanned discharged    Education / Equipment: Could not reassess due to unplanned discharged    Patient agrees to discharge. Patient goals were not met. Patient is being discharged due to not returning since the last visit.  8:58 AM, 07/25/23 Tereasa Coop, DPT Physical Therapy with [place]    Patient Name: Kristine Jones MRN: [id number] DOB:1950/10/08, 72 y.o., female Today's Date: 12/18/2022  END OF SESSION:  PT End of Session - 12/18/22 1514     Visit Number 5    Number of Visits 18    Date for PT Re-Evaluation 01/31/23    Authorization Type BCBS    PT Start Time 1515    PT Stop Time 1558    PT Time Calculation (min) 43 min    Activity Tolerance Patient limited by pain    Behavior During Therapy [facility] for tasks assessed/performed             Past Medical History:  Diagnosis Date   Abnormal stress test    a. 09/2008, ST depression on ETT @ [facility], no further w/u. // Cath 8/14: no CAD   Adenomatous colon polyp    Anxiety and depression    Aortic insufficiency    a. 06/2012 Echo: EF 55-60%, Gr 1 DD, Mild AI, Triv MR, mildly dil LA, nl RV fxn. // Echo 03/2019: EF 60-65, normal RVSF, mod AI, trace MR, trivial TR, Asc Aorta 38 mm, GLS -18.1%   Enlarged thoracic aorta (HCC)    MRA 4/19: 4.1 cm // CT 09/2019: 4 cm // Chest CTA 5/22: Ascending aorta 3.8 cm    History of melanoma    Followed with Dr. Danella Deis   HTN (hypertension)    Hyperlipidemia    Leukocytosis    Mild   Migraines    Osteopenia    Paroxysmal atrial fibrillation (HCC)    admx in 09/2019 w/ choledocho lithiasis >> ERCP // several days later presented to ED with AF with RVR >> DCCV in ED unsuccessful;  CHADS-VASc 4 >> Apixaban started for at least 1 month - ?? long term Rx   Varicose veins    Past Surgical History:  Procedure Laterality Date   BTL     CHOLECYSTECTOMY     DILATION AND CURETTAGE OF UTERUS     ERCP N/A 09/17/2019   Procedure: ENDOSCOPIC RETROGRADE CHOLANGIOPANCREATOGRAPHY (ERCP);  Surgeon: Kerin Salen, MD;  Location: [facility] ENDOSCOPY;  Service: Gastroenterology;  Laterality: N/A;   FEMORAL HERNIA REPAIR     High endocervical polyp removed     IR EMBO VENOUS NOT HEMORR HEMANG  INC GUIDE ROADMAPPING  07/20/2020   LIPOMA EXCISION Right 03/23/2015   Procedure: EXCISION LARGE LIPOMA RIGHT UPPER ARM  ;  Surgeon: Eloise Levels, MD;  Location: [facility];  Service: Plastics;  Laterality: Right;   MELANOMA EXCISION  2003   right calf   OVARIAN CYST REMOVAL     REMOVAL OF STONES  09/17/2019   Procedure: REMOVAL OF STONES;  Surgeon: Kerin Salen, MD;  Location: [facility] ENDOSCOPY;  Service: Gastroenterology;;   Dennison Mascot  09/17/2019   Procedure: Dennison Mascot;  Surgeon: Kerin Salen, MD;  Location: [facility] ENDOSCOPY;  Service: Gastroenterology;;   Patient Active Problem List  Diagnosis Date Noted   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/08/2022   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   Paroxysmal atrial fibrillation (HCC)    Anxiety and depression    Choledocholithiasis    Elevated liver enzymes    Hypotension    Osteopenia    Leukocytopenia 06/23/2014   Pure hypercholesterolemia 10/12/2009   HYPERTENSION, BENIGN 10/12/2009   Aortic regurgitation 10/12/2009   ANXIETY DEPRESSION 10/08/2009   GERD 10/08/2009   PMS 10/08/2009   Disorder of bone and cartilage 10/08/2009   COLONIC POLYPS, ADENOMATOUS, HX OF 10/08/2009   MIGRAINES, HX OF 10/08/2009    PCP: Merri Brunette, MD  REFERRING PROVIDER:   Rodolph Bong, MD    REFERRING DIAG:  [phone] (ICD-10-CM) - Chronic pain of right knee  M25.552 (ICD-10-CM) - Left hip pain  M25.511,G89.29 (ICD-10-CM) - Chronic right  shoulder pain    THERAPY DIAG:  Difficulty in walking, not elsewhere classified  Muscle weakness (generalized)  Chronic pain of right knee  Rationale for Evaluation and Treatment: Rehabilitation  ONSET DATE: chronic knee and hip pain  SUBJECTIVE:   SUBJECTIVE STATEMENT: 12/18/2022 Had pain in both knees this weekend. States she had a lot of trouble getting to sleep last night due to pain. States she she went on the bike, she massaged it and it felt a little better (right knee). States she went on a little walk and it was very painful and it did help.   Eval: States she went to see Dr. Denyse Amass because she was having left hip pain. States her knee was bothering her a little so she had a knee injection on 10/24/22 and it gradually got worse. States that she could barely walk. States she felt like her knee was in excruciating pain and that it was stiff and swollen. States she f/u with MD and xrays showed arthritis.   States she has no hip pain now.  Recent injection in knee increased knee pain.  PERTINENT HISTORY: pseudo gout PAIN:  Are you having pain? Yes: NPRS scale: 8/10 Pain location: right anterior knee  Pain description: achy and tender Aggravating factors: moving, weightbearing Relieving factors: rest   PRECAUTIONS: None  WEIGHT BEARING RESTRICTIONS: No  FALLS:  Has patient fallen in last 6 months? 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 Moderate-to-severe degenerative changes of the left hip joint.  Right knee FINDINGS: Moderate to severe tricompartmental degenerative changes. Loose bodies in the joint. No joint effusion. Enthesopathic changes  off the superior patella. Chondrocalcinosis.   IMPRESSION: 1. Moderate to severe tricompartmental degenerative changes. 2. Loose bodies in the joint. 3. Chondrocalcinosis.   COGNITION: Overall cognitive status: Within functional limits for tasks assessed     SENSATION: Not tested  EDEMA:   R knee joint 15.25 inches --> 14.75 inches after edema massage Left knee joint 14.5 inches  R calf 15.0 inches L calf 14.75 inches   POSTURE: forward head and flexed trunk   PALPATION: Tenderness to light palpation throughout right lower extremity    LE Measurements Lower Extremity Right EVAL Left EVAL   A/PROM MMT A/PROM MMT  Hip Flexion      Hip Extension      Hip Abduction  Hip Adduction      Hip Internal rotation      Hip External rotation      Knee Flexion 130 4 120 4  Knee Extension 0 4 25 (lacking) 4-  Ankle Dorsiflexion      Ankle Plantarflexion      Ankle Inversion      Ankle Eversion       (Blank rows = not tested) * pain   GAIT: Distance walked: 25 feet within clinic Assistive device utilized: None Level of assistance: Modified independence Comments: Antalgic limited knee and hip motion on right   TODAY'S TREATMENT:                                                                                                                              DATE:   12/18/2022  Therapeutic Exercise:  Aerobic:bike   Supine:  heel slides x30 B  Prone:    Seated: LAQs 3x10 B 5" holds  Standing: Neuromuscular Re-education: Manual Therapy:STM to R quad and adductors - tolerated well. Patella mobilizations R tolerated well, tibial bouncing mobilizations R - tolerated well 16 Gait Training: walking with cane - practice verbal cues and prior demonstration - 12 minutes Self Care: Trigger Point Dry Needling:  Modalities:    PATIENT EDUCATION:  Education details: on HEP, on different types of knee injections, on proper elevation and compression garment  Person educated:  Patient Education method: Explanation, Demonstration, and Handouts Education comprehension: verbalized understanding   HOME EXERCISE PROGRAM: [id number]  ASSESSMENT:  CLINICAL IMPRESSION: 12/18/2022 Session focused on education about importance of elevation and use of compression garments s this significantly reduced pain during today's session. Discussed benefits of use of cane when in pain as well as compensations in lumbar spine with walking when in pain. Encouraged patient to f/u with MD about injections secondary to continued pain. Reduced pain noted end of session reporting 4/10 pain. Will continue with current POC.  Eval: Patient presents to physical therapy with complaints of chronic right knee pain with recent acute exacerbation of right knee pain.  Overall pain has improved since episode started but continues to be swollen and painful.  Educated patient on strategies to reduce swelling and improve overall knee mobility and reduce pain.  Stressed importance of compliance with compression garments.  Patient would greatly benefit from skilled physical therapy to improve overall function, reduce pain and improve knee mobility.  OBJECTIVE IMPAIRMENTS: Abnormal gait, decreased activity tolerance, decreased balance, decreased endurance, decreased knowledge of use of DME, decreased mobility, difficulty walking, decreased ROM, decreased strength, increased edema, postural dysfunction, and pain.   ACTIVITY LIMITATIONS: bending, standing, squatting, sleeping, stairs, and locomotion level  PARTICIPATION LIMITATIONS: meal prep, cleaning, and community activity  PERSONAL FACTORS: Fitness and Time since onset of injury/illness/exacerbation are also affecting patient's functional outcome.   REHAB POTENTIAL: Good  CLINICAL DECISION MAKING: Stable/uncomplicated  EVALUATION COMPLEXITY: Low   GOALS: Goals reviewed with patient? yes  SHORT TERM GOALS:  Target date: 12/20/2022  Patient will be  independent in self management strategies to improve quality of life and functional outcomes. Baseline: New Program Goal status: INITIAL  2.  Patient will report at least 50% improvement in overall symptoms and/or function to demonstrate improved functional mobility Baseline: 0% better Goal status: INITIAL  3.  Patient will report wearing compression garments regularly to reduce swelling in legs Baseline: Not currently wearing Goal status: INITIAL     LONG TERM GOALS: Target date: 01/31/2023   Patient will report at least 75% improvement in overall symptoms and/or function to demonstrate improved functional mobility Baseline: 0% better Goal status: INITIAL  2.  Patient will be able to ambulate without pain in right knee to improve household mobility Baseline: Painful Goal status: INITIAL  3.  Swelling will regularly be within half an inch of left leg to demonstrate reduced swelling in right lower extremity Baseline: See above Goal status: INITIAL      PLAN:  PT FREQUENCY: 1-2x/week for total of 18 visits over 12 weeks certification period  PT DURATION: 12 weeks  PLANNED INTERVENTIONS: Therapeutic exercises, Therapeutic activity, Neuromuscular re-education, Balance training, Gait training, Patient/Family education, Self Care, Joint mobilization, Joint manipulation, Stair training, Vestibular training, Canalith repositioning, Orthotic/Fit training, Prosthetic training, DME instructions, Aquatic Therapy, Dry Needling, Electrical stimulation, Spinal manipulation, Spinal mobilization, Cryotherapy, Moist heat, Taping, Traction, Ultrasound, Ionotophoresis 4mg /ml Dexamethasone, Manual therapy, and Re-evaluation.   PLAN FOR NEXT SESSION: Edema management, follow-up with compression garments, lower extremity strength and mobility possibly educated on use of cane when knee agitated   4:01 PM, 12/18/22 Tereasa Coop, DPT Physical Therapy with [facility]

## 2022-12-19 ENCOUNTER — Other Ambulatory Visit: Payer: Self-pay | Admitting: Physician Assistant

## 2022-12-19 ENCOUNTER — Telehealth: Payer: Self-pay | Admitting: Family Medicine

## 2022-12-19 NOTE — Telephone Encounter (Signed)
Prior Auth initiated via [facility] Reference # [id number] - pending review

## 2022-12-19 NOTE — Telephone Encounter (Signed)
Dr Denyse Amass, I'm guessing you just want to check for visco d/t steroid flare with Triamcinolone in the past?   Per visit note 12/01/22:  Assessment and Plan: 72 y.o. female with Left knee pain:  Much worse earlier this well now improving. Plan for watchful waiting and PT.  If not better consider injection. I am fearful of steroid injection as she had a steroid flair in the right knee last month with triamcinolone. Could use depomedrol if needed.    Right ankle: Retrocalanceal bursitis and AT tendonitis.  Plan for PT.

## 2022-12-19 NOTE — Telephone Encounter (Signed)
Pt call back about this request before we could respond. She reports her knees had been doing fine, until last night. She woke up in the middle of the night w/ "severe" knee pain. Advised to try some Tylenol and we would start authorizing her for visco. Note: she has not tolerated hydrocodone in the past.

## 2022-12-19 NOTE — Telephone Encounter (Signed)
VOB initiated for ORTHOVISC for BILAT knee OA.

## 2022-12-19 NOTE — Telephone Encounter (Signed)
Pt having extreme bil knee pain. Would like [place] to check on her benefits for gel and/or Zilretta (whatever was discussed at last visit).  I asked if pt would like to be schedule a visit but she does not think she is due for another steroid inj yet and also had a reaction last time. Pt is taking small amount of pain meds but is struggling to get out of bed due to the pain.  Not sure if we have any other recommendations in the interim.

## 2022-12-19 NOTE — Telephone Encounter (Signed)
Lets try getting get shots authorized.

## 2022-12-21 ENCOUNTER — Ambulatory Visit: Payer: Medicare Other | Admitting: Family Medicine

## 2022-12-21 ENCOUNTER — Other Ambulatory Visit: Payer: Self-pay

## 2022-12-21 DIAGNOSIS — M25561 Pain in right knee: Secondary | ICD-10-CM | POA: Diagnosis not present

## 2022-12-21 DIAGNOSIS — G8929 Other chronic pain: Secondary | ICD-10-CM | POA: Diagnosis not present

## 2022-12-21 DIAGNOSIS — M1711 Unilateral primary osteoarthritis, right knee: Secondary | ICD-10-CM

## 2022-12-21 MED ORDER — HYALURONAN 30 MG/2ML IX SOSY
30.0000 mg | PREFILLED_SYRINGE | Freq: Once | INTRA_ARTICULAR | Status: AC
Start: 2022-12-21 — End: 2022-12-21
  Administered 2022-12-21: 30 mg via INTRA_ARTICULAR

## 2022-12-21 MED ORDER — TRAMADOL HCL 50 MG PO TABS: 50.0000 mg | ORAL_TABLET | Freq: Three times a day (TID) | ORAL | 0 refills | Status: DC | PRN

## 2022-12-21 NOTE — Patient Instructions (Signed)
Thank you for coming in today.   You received an injection today. Seek immediate medical attention if the joint becomes red, extremely painful, or is oozing fluid.   Schedule the 2nd Orthovisc injection for next week with Dr. Jean Rosenthal  And   Schedule the 3rd Orthovisc injection with me the week after that

## 2022-12-21 NOTE — Progress Notes (Signed)
ARABELLAH HAILES presents to clinic today for Orthovisc injection right knee 1/3 Procedure: Real-time Ultrasound Guided Injection of right knee joint superior lateral patellar space Device: Philips Affiniti 50G/GE Logiq Images permanently stored and available for review in PACS Verbal informed consent obtained.  Discussed risks and benefits of procedure. Warned about infection, bleeding, damage to structures among others. Patient expresses understanding and agreement Time-out conducted.   Noted no overlying erythema, induration, or other signs of local infection.   Skin prepped in a sterile fashion.   Local anesthesia: Topical Ethyl chloride.   With sterile technique and under real time ultrasound guidance: Orthovisc 30 mg injected into knee joint. Fluid seen entering the joint capsule.   Completed without difficulty   Advised to call if fevers/chills, erythema, induration, drainage, or persistent bleeding.   Images permanently stored and available for review in the ultrasound unit.  Impression: Technically successful ultrasound guided injection. Lot number: [id number]   Emerlyn is having quite a lot of knee pain.  We are starting Orthovisc series as noted above today.   She will schedule with me the week after next as I am out of the office next week for Orthovisc injection #2.  However if her symptoms are worsening she can get an appointment with Dr. Jean Rosenthal who could do a Depo-Medrol injection.  She had a steroid flare to triamcinolone so we should avoid triamcinolone.

## 2022-12-21 NOTE — Telephone Encounter (Signed)
Orthovisc for BILAT knee OA  Primary Insurance: BCBS [place] Medicare Adv Choice HMO Co-pay: $10 Co-insurance: 20% Deductible: does not apply  Prior Auth: APPROVED PA# [id number] Valid: 12/20/22-03/22/23

## 2022-12-26 ENCOUNTER — Encounter: Payer: Medicare Other | Admitting: Physical Therapy

## 2022-12-26 NOTE — Telephone Encounter (Signed)
Scheduled: #1 8/8 #2 8/19 #3 8/26

## 2022-12-27 ENCOUNTER — Other Ambulatory Visit: Payer: Self-pay | Admitting: Cardiovascular Disease

## 2022-12-28 DIAGNOSIS — J310 Chronic rhinitis: Secondary | ICD-10-CM | POA: Diagnosis not present

## 2022-12-28 DIAGNOSIS — J342 Deviated nasal septum: Secondary | ICD-10-CM | POA: Diagnosis not present

## 2022-12-28 DIAGNOSIS — R430 Anosmia: Secondary | ICD-10-CM | POA: Diagnosis not present

## 2023-01-01 ENCOUNTER — Ambulatory Visit: Payer: Medicare Other | Admitting: Family Medicine

## 2023-01-01 ENCOUNTER — Other Ambulatory Visit: Payer: Self-pay

## 2023-01-01 DIAGNOSIS — M1711 Unilateral primary osteoarthritis, right knee: Secondary | ICD-10-CM

## 2023-01-01 DIAGNOSIS — G8929 Other chronic pain: Secondary | ICD-10-CM | POA: Diagnosis not present

## 2023-01-01 MED ORDER — HYALURONAN 30 MG/2ML IX SOSY
30.0000 mg | PREFILLED_SYRINGE | Freq: Once | INTRA_ARTICULAR | Status: AC
Start: 2023-01-01 — End: 2023-01-01
  Administered 2023-01-01: 30 mg via INTRA_ARTICULAR

## 2023-01-01 NOTE — Progress Notes (Signed)
DAYSIE HADDER presents to clinic today for Orthovisc injection right knee 2/3 Procedure: Real-time Ultrasound Guided Injection of right knee joint superior lateral patellar space Device: Philips Affiniti 50G/GE Logiq Images permanently stored and available for review in PACS Verbal informed consent obtained.  Discussed risks and benefits of procedure. Warned about infection, bleeding, damage to structures among others. Patient expresses understanding and agreement Time-out conducted.   Noted no overlying erythema, induration, or other signs of local infection.   Skin prepped in a sterile fashion.   Local anesthesia: Topical Ethyl chloride.   With sterile technique and under real time ultrasound guidance: Orthovisc 30 mg injected into knee joint. Fluid seen entering the joint capsule.   Completed without difficulty   Advised to call if fevers/chills, erythema, induration, drainage, or persistent bleeding.   Images permanently stored and available for review in the ultrasound unit.  Impression: Technically successful ultrasound guided injection. Lot number: [id number]  Return in 1 week for Orthovisc injection right knee 3/3.

## 2023-01-01 NOTE — Patient Instructions (Addendum)
Thank you for coming in today.   You received an injection today. Seek immediate medical attention if the joint becomes red, extremely painful, or is oozing fluid.   We will see you next week for the 3rd Orthovisc injection. 

## 2023-01-02 ENCOUNTER — Encounter: Payer: Medicare Other | Admitting: Physical Therapy

## 2023-01-02 ENCOUNTER — Encounter: Payer: Self-pay | Admitting: Family Medicine

## 2023-01-02 NOTE — Telephone Encounter (Signed)
Called and spoke with pt. She denies increased warmth, erythema, swelling, or drainage at injection site. Pt denies fever.   Notes some increased pain from about mid patella towards the medial aspect of the knee.   I advised pt that it is not uncommon to have slight worsening of sx during the 1st day or 2 after an injection but out anticipation would be that her sx start to gradually improve over the next 1-2 days. She will contact the office on Thursday if sx have not improved or sooner if she develops signs/sx of infection.

## 2023-01-08 ENCOUNTER — Ambulatory Visit: Payer: Medicare Other | Admitting: Family Medicine

## 2023-01-08 ENCOUNTER — Other Ambulatory Visit: Payer: Self-pay

## 2023-01-08 DIAGNOSIS — M1711 Unilateral primary osteoarthritis, right knee: Secondary | ICD-10-CM | POA: Diagnosis not present

## 2023-01-08 DIAGNOSIS — M25561 Pain in right knee: Secondary | ICD-10-CM | POA: Diagnosis not present

## 2023-01-08 DIAGNOSIS — G8929 Other chronic pain: Secondary | ICD-10-CM

## 2023-01-08 MED ORDER — HYALURONAN 30 MG/2ML IX SOSY
30.0000 mg | PREFILLED_SYRINGE | Freq: Once | INTRA_ARTICULAR | Status: AC
Start: 2023-01-08 — End: 2023-01-08
  Administered 2023-01-08: 30 mg via INTRA_ARTICULAR

## 2023-01-08 NOTE — Patient Instructions (Addendum)
Thank you for coming in today.   You received an injection today. Seek immediate medical attention if the joint becomes red, extremely painful, or is oozing fluid.  

## 2023-01-08 NOTE — Progress Notes (Signed)
Kristine Jones presents to clinic today for Orthovisc injection right knee 3/3 Procedure: Real-time Ultrasound Guided Injection of right knee joint anterior medial approach Device: Philips Affiniti 50G/GE Logiq Images permanently stored and available for review in PACS Verbal informed consent obtained.  Discussed risks and benefits of procedure. Warned about infection, bleeding, damage to structures among others. Patient expresses understanding and agreement Time-out conducted.   Noted no overlying erythema, induration, or other signs of local infection.   Skin prepped in a sterile fashion.   Local anesthesia: Topical Ethyl chloride.   With sterile technique and under real time ultrasound guidance: Orthovisc 30 mg injected into knee joint. Fluid seen entering the joint capsule.   Completed without difficulty   Advised to call if fevers/chills, erythema, induration, drainage, or persistent bleeding.   Images permanently stored and available for review in the ultrasound unit.  Impression: Technically successful ultrasound guided injection. Lot number: [id number]  If this is not effective after a few weeks she will let me know and after discussion we will refer to orthopedic surgery for surgical consultation evaluation total knee replacement.

## 2023-01-09 NOTE — Telephone Encounter (Signed)
Pt completed Orthovisc series for RIGHT knee OA on 01/08/23. Can consider repeat inj on or after 07/12/23.

## 2023-01-20 ENCOUNTER — Other Ambulatory Visit: Payer: Self-pay | Admitting: Cardiovascular Disease

## 2023-01-30 DIAGNOSIS — L820 Inflamed seborrheic keratosis: Secondary | ICD-10-CM | POA: Diagnosis not present

## 2023-01-30 DIAGNOSIS — L821 Other seborrheic keratosis: Secondary | ICD-10-CM | POA: Diagnosis not present

## 2023-01-30 DIAGNOSIS — L720 Epidermal cyst: Secondary | ICD-10-CM | POA: Diagnosis not present

## 2023-01-30 DIAGNOSIS — D2261 Melanocytic nevi of right upper limb, including shoulder: Secondary | ICD-10-CM | POA: Diagnosis not present

## 2023-01-30 DIAGNOSIS — D2262 Melanocytic nevi of left upper limb, including shoulder: Secondary | ICD-10-CM | POA: Diagnosis not present

## 2023-01-30 DIAGNOSIS — D485 Neoplasm of uncertain behavior of skin: Secondary | ICD-10-CM | POA: Diagnosis not present

## 2023-01-30 DIAGNOSIS — D2221 Melanocytic nevi of right ear and external auricular canal: Secondary | ICD-10-CM | POA: Diagnosis not present

## 2023-02-22 ENCOUNTER — Other Ambulatory Visit: Payer: Self-pay | Admitting: Cardiovascular Disease

## 2023-03-05 DIAGNOSIS — M810 Age-related osteoporosis without current pathological fracture: Secondary | ICD-10-CM | POA: Diagnosis not present

## 2023-03-12 DIAGNOSIS — M810 Age-related osteoporosis without current pathological fracture: Secondary | ICD-10-CM | POA: Diagnosis not present

## 2023-03-16 ENCOUNTER — Encounter: Payer: Self-pay | Admitting: Cardiovascular Disease

## 2023-03-16 NOTE — Telephone Encounter (Signed)
Decrease Diltiazem to 180 mg once daily. Tereso Newcomer, PA-C    03/16/2023 11:11 AM

## 2023-04-03 DIAGNOSIS — Z8262 Family history of osteoporosis: Secondary | ICD-10-CM | POA: Diagnosis not present

## 2023-04-03 DIAGNOSIS — Z1231 Encounter for screening mammogram for malignant neoplasm of breast: Secondary | ICD-10-CM | POA: Diagnosis not present

## 2023-04-03 DIAGNOSIS — M8588 Other specified disorders of bone density and structure, other site: Secondary | ICD-10-CM | POA: Diagnosis not present

## 2023-04-16 DIAGNOSIS — Z23 Encounter for immunization: Secondary | ICD-10-CM | POA: Diagnosis not present

## 2023-04-19 ENCOUNTER — Ambulatory Visit (HOSPITAL_COMMUNITY): Payer: Medicare Other | Attending: Internal Medicine

## 2023-04-19 DIAGNOSIS — I351 Nonrheumatic aortic (valve) insufficiency: Secondary | ICD-10-CM | POA: Insufficient documentation

## 2023-04-19 DIAGNOSIS — I480 Paroxysmal atrial fibrillation: Secondary | ICD-10-CM | POA: Diagnosis not present

## 2023-04-19 LAB — ECHOCARDIOGRAM COMPLETE
Area-P 1/2: 3.77 cm2
P 1/2 time: 405 ms
S' Lateral: 2.8 cm

## 2023-04-23 ENCOUNTER — Telehealth: Payer: Self-pay | Admitting: *Deleted

## 2023-04-23 DIAGNOSIS — I351 Nonrheumatic aortic (valve) insufficiency: Secondary | ICD-10-CM

## 2023-04-23 NOTE — Telephone Encounter (Signed)
-----   Message from Tereso Newcomer sent at 04/20/2023  1:27 PM EST ----- Results sent to Kristine Jones via [place]. See MyChart comments below.  I will send a copy to PCP as FYI. PLAN:  -Repeat echo 1 year (Dx: Aortic insufficiency).  [name]  Your echocardiogram demonstrates normal heart function (ejection fraction).  Leakage of the aortic valve (aortic insufficiency) remains moderate.  Continue current medications.  We will continue to monitor this with another echocardiogram in 1 year. Tereso Newcomer, PA-C

## 2023-04-27 DIAGNOSIS — R5383 Other fatigue: Secondary | ICD-10-CM | POA: Diagnosis not present

## 2023-04-27 DIAGNOSIS — R519 Headache, unspecified: Secondary | ICD-10-CM | POA: Diagnosis not present

## 2023-04-29 ENCOUNTER — Emergency Department (HOSPITAL_BASED_OUTPATIENT_CLINIC_OR_DEPARTMENT_OTHER)
Admission: EM | Admit: 2023-04-29 | Discharge: 2023-04-30 | Disposition: A | Discharge status: Home / Self Care | Payer: Medicare Other | Attending: Emergency Medicine | Admitting: Emergency Medicine

## 2023-04-29 ENCOUNTER — Other Ambulatory Visit: Payer: Self-pay

## 2023-04-29 DIAGNOSIS — Y92812 Truck as the place of occurrence of the external cause: Secondary | ICD-10-CM | POA: Insufficient documentation

## 2023-04-29 DIAGNOSIS — S0990XA Unspecified injury of head, initial encounter: Secondary | ICD-10-CM | POA: Insufficient documentation

## 2023-04-29 DIAGNOSIS — Z7901 Long term (current) use of anticoagulants: Secondary | ICD-10-CM | POA: Diagnosis not present

## 2023-04-29 DIAGNOSIS — W228XXA Striking against or struck by other objects, initial encounter: Secondary | ICD-10-CM | POA: Diagnosis not present

## 2023-04-29 DIAGNOSIS — I4891 Unspecified atrial fibrillation: Secondary | ICD-10-CM | POA: Insufficient documentation

## 2023-04-29 NOTE — ED Provider Notes (Signed)
[place] EMERGENCY DEPARTMENT AT [facility] Provider Note   CSN: [id number] Arrival date & time: 04/29/23  1735     History  Chief Complaint  Patient presents with   Head Injury    Kristine Jones is a 72 y.o. female.  Patient is a 72 year old female presenting with complaints of a head injury.  She was unloading her car when the automatic trunk engaged and struck her in the top of the head.  She denies any loss of consciousness or headache.  No visual disturbances.  She denies any numbness or tingling.  Patient is concerned because she is on Eliquis for history of heart valve issues and A-fib.  The history is provided by the patient.       Home Medications Prior to Admission medications   Medication Sig Start Date End Date Taking? Authorizing Provider  apixaban (ELIQUIS) 5 MG TABS tablet TAKE 1 TABLET BY MOUTH 2 TIMES A DAY 12/04/22   Kathleene Hazel, MD  atenolol (TENORMIN) 25 MG tablet TAKE 1 TABLET BY MOUTH DAILY AS NEEDED 12/19/22   Tereso Newcomer T, PA-C  Calcium Carbonate-Vitamin D (CALCIUM-VITAMIN D3) 600-200 MG-UNIT TABS Take 2 tablets by mouth daily.    [provider]  Cholecalciferol (VITAMIN D) 1000 UNITS capsule Take 1,000 Units by mouth daily.    [provider]  denosumab (PROLIA) 60 MG/ML SOSY injection See admin instructions.    [provider]  diltiazem (CARDIZEM CD) 180 MG 24 hr capsule TAKE 1 CAPSULE BY MOUTH DAILY 12/28/22   Kathleene Hazel, MD  estradiol (ESTRACE) 0.1 MG/GM vaginal cream Place 1 Applicatorful vaginally as needed (vaginal discomfort).     [provider]  fish oil-omega-3 fatty acids 1000 MG capsule Take 1 g by mouth daily.    [provider]  irbesartan (AVAPRO) 75 MG tablet TAKE 1 TABLET BY MOUTH DAILY 01/22/23   Kathleene Hazel, MD  multivitamin [facility]) per tablet Take 1 tablet by mouth daily.    [provider]  sertraline (ZOLOFT) 50 MG tablet  Take 1 1/2 tablet by mouth (75 mg total) daily    [provider]  traMADol (ULTRAM) 50 MG tablet Take 1 tablet (50 mg total) by mouth every 8 (eight) hours as needed for severe pain. 12/21/22   Rodolph Bong, MD      Allergies    Indocin [indomethacin], Ibuprofen, Lisinopril, Naproxen, Naproxen sodium, Penicillins, Triamterene, and Penicillin g    Review of Systems   Review of Systems  All other systems reviewed and are negative.   Physical Exam Updated Vital Signs BP (!) 144/60 (BP Location: Right Arm)   Pulse 60   Temp 98.3 F (36.8 C) (Oral)   Resp 18   Ht 5\' 8"  (1.727 m)   Wt 70.3 kg   SpO2 98%   BMI 23.57 kg/m  Physical Exam Vitals and nursing note reviewed.  Constitutional:      Appearance: Normal appearance.  HENT:     Head: Normocephalic.     Comments: There is a lump noted to the left parietal region, but no laceration or palpable defect. Eyes:     Extraocular Movements: Extraocular movements intact.     Pupils: Pupils are equal, round, and reactive to light.  Pulmonary:     Effort: Pulmonary effort is normal.  Skin:    General: Skin is warm and dry.  Neurological:     General: No focal deficit present.     Mental  Status: She is alert and oriented to person, place, and time.     Cranial Nerves: No cranial nerve deficit.     Motor: No weakness.     Coordination: Coordination normal.     ED Results / Procedures / Treatments   Labs (all labs ordered are listed, but only abnormal results are displayed) Labs Reviewed - No data to display  EKG None  Radiology No results found.  Procedures Procedures  {Document cardiac monitor, telemetry assessment procedure when appropriate:1}  Medications Ordered in ED Medications - No data to display  ED Course/ Medical Decision Making/ A&P   {   Click here for ABCD2, HEART and other calculatorsREFRESH Note before signing :1}                              Medical Decision Making Amount and/or  Complexity of Data Reviewed Radiology: ordered.   ***  {Document critical care time when appropriate:1} {Document review of labs and clinical decision tools ie heart score, Chads2Vasc2 etc:1}  {Document your independent review of radiology images, and any outside records:1} {Document your discussion with family members, caretakers, and with consultants:1} {Document social determinants of health affecting pt's care:1} {Document your decision making why or why not admission, treatments were needed:1} Final Clinical Impression(s) / ED Diagnoses Final diagnoses:  None    Rx / DC Orders ED Discharge Orders     None

## 2023-04-29 NOTE — ED Triage Notes (Signed)
Pt POV from home after hitting top of head on car trunk as it was closing. No knot or bleeding noted, neg LOC, no blurred vision, is on Eliquis.

## 2023-04-30 ENCOUNTER — Emergency Department (HOSPITAL_BASED_OUTPATIENT_CLINIC_OR_DEPARTMENT_OTHER): Payer: Medicare Other

## 2023-04-30 DIAGNOSIS — S0990XA Unspecified injury of head, initial encounter: Secondary | ICD-10-CM | POA: Diagnosis not present

## 2023-04-30 NOTE — Discharge Instructions (Signed)
Continue medications as previously prescribed.  Return to the ER if you develop severe headache, dizziness, vomiting, or for other new and concerning symptoms.

## 2023-05-17 ENCOUNTER — Telehealth: Payer: Self-pay

## 2023-05-17 NOTE — Progress Notes (Signed)
 Transition Care Management Unsuccessful Follow-up Telephone Call  Date of discharge and from where:  [facility] 12/16  Attempts:  1st Attempt  Reason for unsuccessful TCM follow-up call:  No answer/busy   Jon Colt   [facility], [facility] Guide, Phone: [phone] Website: [email or web address]

## 2023-05-18 ENCOUNTER — Telehealth: Payer: Self-pay

## 2023-05-18 NOTE — Progress Notes (Signed)
 Transition Care Management Unsuccessful Follow-up Telephone Call  Date of discharge and from where:  [facility] 12/16  Attempts:  2nd Attempt  Reason for unsuccessful TCM follow-up call:  No answer/busy   Kristine Jones  [facility], [facility] Guide, Phone: [phone] Website: [email or web address]

## 2023-06-03 ENCOUNTER — Other Ambulatory Visit: Payer: Self-pay | Admitting: Cardiovascular Disease

## 2023-06-03 DIAGNOSIS — I4891 Unspecified atrial fibrillation: Secondary | ICD-10-CM

## 2023-06-04 NOTE — Telephone Encounter (Signed)
Prescription refill request for Eliquis received. Indication:afib Last office visit:6/24 Scr:0.71  10/24 Age: 73 Weight:70.3  kg  Prescription refilled

## 2023-06-11 DIAGNOSIS — I480 Paroxysmal atrial fibrillation: Secondary | ICD-10-CM

## 2023-06-12 NOTE — Telephone Encounter (Signed)
Please arrange 14 day Zio XT - dx: atrial fibrillation  Tereso Newcomer, PA-C    06/12/2023 10:50 PM

## 2023-06-13 ENCOUNTER — Ambulatory Visit: Payer: Medicare Other | Attending: Physician Assistant

## 2023-06-13 DIAGNOSIS — I480 Paroxysmal atrial fibrillation: Secondary | ICD-10-CM

## 2023-06-13 NOTE — Progress Notes (Unsigned)
Enrolled for [employer] to mail a ZIO XT long term holter monitor to the patients address on file.  Requested monitor to be mailed 06/20/23. Dr. Clifton James to read.

## 2023-06-15 NOTE — Telephone Encounter (Signed)
Followed up with pt to try and alleviate any anxiety/concerns related to this. She reports HRs may going up to 104, 107. No symptoms, but concerned/anxious. Informed pt that she did the right thing. Discussed afib episode lengths, when to call office. Aware that she is stable at this time, that we may not do very much prior to getting monitor results to determine treatment plan. Advised to call back if out of rhythm for more than 24-48 hours and/or she is symptomatic. Patient verbalized understanding and agreeable to plan.  She feels much better after talking to me and appreciates me calling and not just responding. Aware we will only call back if provider has more to add to this advisement and/or wants change to treatment plan.

## 2023-06-17 NOTE — Telephone Encounter (Signed)
Agree with plan Tereso Newcomer, PA-C    06/17/2023 10:58 PM

## 2023-06-18 NOTE — Telephone Encounter (Signed)
VOB initiated for Orthovisc for BILAT knee OA.  

## 2023-06-19 NOTE — Telephone Encounter (Signed)
 Holding until needed

## 2023-06-19 NOTE — Telephone Encounter (Signed)
[employer] and [employer] St[employer] - Prior Authorization NOT required  Pharmacy Benefit - NOT covered

## 2023-06-19 NOTE — Telephone Encounter (Signed)
 ORTHOVISC for BILAT knee OA  OK to schedule on or after 07/12/23  [employer] and [employer]: BCBS [place] Medicare Adv Co-pay: $15 Co-insurance: 20% Deductible: does not apply Prior Auth: NOT required  Pharmacy Benefit   NOT covered   Knee Injection History 12/08/22 - Kenalog  RIGHT 10/24/22 - Kenalog  RIGHT 12/21/22 - Orthovisc RIGHT 01/01/23 - Orthovisc RIGHT 01/08/23 - Orthovisc RIGHT

## 2023-06-22 DIAGNOSIS — I480 Paroxysmal atrial fibrillation: Secondary | ICD-10-CM

## 2023-06-26 DIAGNOSIS — D2262 Melanocytic nevi of left upper limb, including shoulder: Secondary | ICD-10-CM | POA: Diagnosis not present

## 2023-06-26 DIAGNOSIS — L820 Inflamed seborrheic keratosis: Secondary | ICD-10-CM | POA: Diagnosis not present

## 2023-06-26 DIAGNOSIS — L905 Scar conditions and fibrosis of skin: Secondary | ICD-10-CM | POA: Diagnosis not present

## 2023-07-10 DIAGNOSIS — I480 Paroxysmal atrial fibrillation: Secondary | ICD-10-CM | POA: Diagnosis not present

## 2023-07-10 DIAGNOSIS — H33193 Other retinoschisis and retinal cysts, bilateral: Secondary | ICD-10-CM | POA: Diagnosis not present

## 2023-07-10 DIAGNOSIS — D3131 Benign neoplasm of right choroid: Secondary | ICD-10-CM | POA: Diagnosis not present

## 2023-07-10 DIAGNOSIS — H35363 Drusen (degenerative) of macula, bilateral: Secondary | ICD-10-CM | POA: Diagnosis not present

## 2023-07-10 DIAGNOSIS — H43813 Vitreous degeneration, bilateral: Secondary | ICD-10-CM | POA: Diagnosis not present

## 2023-07-11 DIAGNOSIS — I480 Paroxysmal atrial fibrillation: Secondary | ICD-10-CM

## 2023-07-16 NOTE — Telephone Encounter (Signed)
 I spoke with the pt and she reports that she would like to see the Afib clinic... order for referral placed.

## 2023-07-24 ENCOUNTER — Telehealth: Payer: Self-pay | Admitting: Cardiovascular Disease

## 2023-07-24 ENCOUNTER — Ambulatory Visit (HOSPITAL_COMMUNITY)
Admission: RE | Admit: 2023-07-24 | Discharge: 2023-07-24 | Disposition: A | Discharge status: Home / Self Care | Source: Ambulatory Visit | Attending: Internal Medicine | Admitting: Internal Medicine

## 2023-07-24 VITALS — BP 182/82 | HR 71 | Ht 68.0 in | Wt 154.2 lb

## 2023-07-24 DIAGNOSIS — Z79899 Other long term (current) drug therapy: Secondary | ICD-10-CM | POA: Diagnosis not present

## 2023-07-24 DIAGNOSIS — I471 Supraventricular tachycardia, unspecified: Secondary | ICD-10-CM | POA: Diagnosis not present

## 2023-07-24 DIAGNOSIS — Z7901 Long term (current) use of anticoagulants: Secondary | ICD-10-CM | POA: Insufficient documentation

## 2023-07-24 DIAGNOSIS — I480 Paroxysmal atrial fibrillation: Secondary | ICD-10-CM | POA: Diagnosis not present

## 2023-07-24 DIAGNOSIS — D6869 Other thrombophilia: Secondary | ICD-10-CM | POA: Insufficient documentation

## 2023-07-24 DIAGNOSIS — I119 Hypertensive heart disease without heart failure: Secondary | ICD-10-CM | POA: Insufficient documentation

## 2023-07-24 MED ORDER — DILTIAZEM HCL ER COATED BEADS 240 MG PO CP24: 240.0000 mg | ORAL_CAPSULE | Freq: Every day | ORAL | 6 refills | Status: DC

## 2023-07-24 NOTE — Patient Instructions (Signed)
Increase cardizem (diltiazem) to 240mg  once a day

## 2023-07-24 NOTE — Telephone Encounter (Signed)
 Pt contacted and advised it is best to go to appointment. Reports her husband is going to drive her to her A.Fib clinic appointment.

## 2023-07-24 NOTE — Telephone Encounter (Signed)
 Patient calling in bp is 160/82 HR 70. Calling to see if she should still go to her appt this afternoon at the clinic. Please advise

## 2023-07-24 NOTE — Progress Notes (Addendum)
 Primary Care Physician: Merri Brunette, MD Primary Cardiologist: Verne Carrow, MD Electrophysiologist: None     Referring Physician: Tereso Newcomer, PA-C     Kristine Jones is a 73 y.o. female with a history of HTN, ascending aorta dilation, aortic regurgitation, and paroxysmal atrial fibrillation who presents for consultation in the [facility]. Cardiac monitor worn 06/2023 showed 4% Afib burden. Patient is on Eliquis 5 mg BID for a CHADS2VASC score of 3.  On evaluation today, she is currently in NSR. She is here today to discuss monitor results. She notes that previously was on diltiazem 180 mg AM and 120 mg PM. She feels that controlled her palpitations much better last year. She notes the 120 mg dosage was removed due to her noting fatigue. Unfortunately, she notes increased palpitations for the past few months. No missed doses of Eliquis.   Today, she denies symptoms of chest pain, shortness of breath, orthopnea, PND, lower extremity edema, dizziness, presyncope, syncope, snoring, daytime somnolence, bleeding, or neurologic sequela. The patient is tolerating medications without difficulties and is otherwise without complaint today.   she has a BMI of Body mass index is 23.45 kg/m.Marland Kitchen Filed Weights   07/24/23 1351  Weight: 69.9 kg    Current Outpatient Medications  Medication Sig Dispense Refill   apixaban (ELIQUIS) 5 MG TABS tablet TAKE 1 TABLET BY MOUTH 2 TIMES A DAY 60 tablet 5   atenolol (TENORMIN) 25 MG tablet TAKE 1 TABLET BY MOUTH DAILY AS NEEDED 30 tablet 11   Calcium Carbonate-Vitamin D (CALCIUM-VITAMIN D3) 600-200 MG-UNIT TABS Take 2 tablets by mouth daily.     Cholecalciferol (VITAMIN D) 1000 UNITS capsule Take 1,000 Units by mouth daily.     denosumab (PROLIA) 60 MG/ML SOSY injection every 6 (six) months.     estradiol (ESTRACE) 0.1 MG/GM vaginal cream Place 1 Applicatorful vaginally as needed (vaginal discomfort).      fish  oil-omega-3 fatty acids 1000 MG capsule Take 1 g by mouth daily.     fluticasone (FLONASE) 50 MCG/ACT nasal spray Place 2 sprays into both nostrils daily.     irbesartan (AVAPRO) 75 MG tablet TAKE 1 TABLET BY MOUTH DAILY 90 tablet 2   multivitamin (THERAGRAN) per tablet Take 1 tablet by mouth daily.     sertraline (ZOLOFT) 50 MG tablet Take 1 1/2 tablet by mouth (75 mg total) daily     diltiazem (CARDIZEM CD) 240 MG 24 hr capsule Take 1 capsule (240 mg total) by mouth daily. 30 capsule 6   No current facility-administered medications for this encounter.    Atrial Fibrillation Management history:  Previous antiarrhythmic drugs: none Previous cardioversions: none Previous ablations: none Anticoagulation history: Eliquis   ROS- All systems are reviewed and negative except as per the HPI above.  Physical Exam: BP (!) 182/82   Pulse 71   Ht 5\' 8"  (1.727 m)   Wt 69.9 kg   BMI 23.45 kg/m   GEN: Well nourished, well developed in no acute distress NECK: No JVD; No carotid bruits CARDIAC: Regular rate and rhythm, no murmurs, rubs, gallops RESPIRATORY:  Clear to auscultation without rales, wheezing or rhonchi  ABDOMEN: Soft, non-tender, non-distended EXTREMITIES:  No edema; No deformity   EKG today demonstrates  Vent. rate 71 BPM PR interval 150 ms QRS duration 100 ms QT/QTcB 402/436 ms P-R-T axes 56 -16 68 Normal sinus rhythm Right atrial enlargement Left ventricular hypertrophy ( R in aVL , Sokolow-Lyon , Cornell product )  Anteroseptal infarct , age undetermined Abnormal ECG When compared with ECG of 08-Nov-2022 15:19, PREVIOUS ECG IS PRESENT  Echo 04/19/23 demonstrated  1. Left ventricular ejection fraction, by estimation, is 60 to 65%. The  left ventricle has normal function. The left ventricle has no regional  wall motion abnormalities. There is mild concentric left ventricular  hypertrophy. Left ventricular diastolic  parameters are consistent with Grade I diastolic  dysfunction (impaired  relaxation). The average left ventricular global longitudinal strain is  -20.6 %. The global longitudinal strain is normal.   2. Right ventricular systolic function is normal. The right ventricular  size is normal. There is normal pulmonary artery systolic pressure. The  estimated right ventricular systolic pressure is 23.1 mmHg.   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 Trivial mitral valve regurgitation.   4. The aortic valve is tricuspid. Aortic valve regurgitation is moderate.  Aortic regurgitation PHT measures 405 msec.   5. The inferior vena cava is normal in size with greater than 50%  respiratory variability, suggesting right atrial pressure of 3 mmHg.   Cardiac monitor 06/2023: Patch Wear Time:  13 days and 23 hours (2025-02-07T10:34:44-0500 to 2025-02-21T10:34:36-0500)   Patient had a min HR of 39 bpm, max HR of 194 bpm, and avg HR of 64 bpm.  Predominant underlying rhythm was Sinus Rhythm.  1 run of Ventricular Tachycardia occurred lasting 4 beats with a max rate of 194 bpm (avg 173 bpm).  1 run of Supraventricular Tachycardia occurred lasting 7 beats with a max rate of 118 bpm (avg 109 bpm).  Atrial Fibrillation/Flutter occurred (4% burden), ranging from 50-173 bpm (avg of 96 bpm), the longest lasting 5 hours 58 mins with an avg rate of 102 bpm.  Atrial Fibrillation/Flutter was detected within +/- 45 seconds of symptomatic patient event(s).  Isolated SVEs were rare (<1.0%), SVE Couplets were rare (<1.0%), and no SVE Triplets were present.  Isolated VEs were rare (<1.0%), VE Couplets were rare (<1.0%), and no VE Triplets were present.   ASSESSMENT & PLAN CHA2DS2-VASc Score = 3  The patient's score is based upon: CHF History: 0 HTN History: 1 Diabetes History: 0 Stroke History: 0 Vascular Disease History: 0 Age Score: 1 Gender Score: 1       ASSESSMENT AND PLAN: Paroxysmal Atrial Fibrillation (ICD10:  I48.0) The patient's CHA2DS2-VASc score is 3,  indicating a 3.2% annual risk of stroke.    She is currently in NSR. We had a long discussion about medication treatments and ablation in detail. We discussed potential options such as Multaq, flecainide, Tikosyn, and amiodarone. We talked about the monitoring required for these medications, hospital admission for Tikosyn, and potential adverse effects. We also discussed pulse field ablation as an option in the form of intervention. After discussion, patient would like to increase her heart rate control and will delay any form of rhythm control at this time. Will increase diltiazem to 240 mg daily. She will continue to take atenolol prn.   Secondary Hypercoagulable State (ICD10:  D68.69) The patient is at significant risk for stroke/thromboembolism based upon her CHA2DS2-VASc Score of 3.  Continue Apixaban (Eliquis).  Continue Eliquis without interruption.   Hypertension Diltiazem will be increased as noted above. Patient notes at home her BP is usually 120-130 but very recently saw a systolic reading of 160. I advised patient to continue with BP diary and contact clinic if BP is sustained > 140.   Follow up as scheduled with Tereso Newcomer, PA-C.    Lake Bells, PA-C  Afib [facility] [address] [place], [place] [address] [phone]

## 2023-08-22 DIAGNOSIS — E7800 Pure hypercholesterolemia, unspecified: Secondary | ICD-10-CM | POA: Diagnosis not present

## 2023-08-22 DIAGNOSIS — R42 Dizziness and giddiness: Secondary | ICD-10-CM | POA: Diagnosis not present

## 2023-08-22 DIAGNOSIS — Z8639 Personal history of other endocrine, nutritional and metabolic disease: Secondary | ICD-10-CM | POA: Diagnosis not present

## 2023-08-22 DIAGNOSIS — R5383 Other fatigue: Secondary | ICD-10-CM | POA: Diagnosis not present

## 2023-08-22 DIAGNOSIS — R52 Pain, unspecified: Secondary | ICD-10-CM | POA: Diagnosis not present

## 2023-08-23 LAB — LAB REPORT - SCANNED
EGFR (Non-African Amer.): 92
TSH: 1.51 (ref 0.41–5.90)

## 2023-09-05 DIAGNOSIS — Z0000 Encounter for general adult medical examination without abnormal findings: Secondary | ICD-10-CM | POA: Diagnosis not present

## 2023-09-05 DIAGNOSIS — F411 Generalized anxiety disorder: Secondary | ICD-10-CM | POA: Diagnosis not present

## 2023-09-05 DIAGNOSIS — M8580 Other specified disorders of bone density and structure, unspecified site: Secondary | ICD-10-CM | POA: Diagnosis not present

## 2023-09-05 DIAGNOSIS — I10 Essential (primary) hypertension: Secondary | ICD-10-CM | POA: Diagnosis not present

## 2023-09-05 DIAGNOSIS — M109 Gout, unspecified: Secondary | ICD-10-CM | POA: Diagnosis not present

## 2023-09-27 ENCOUNTER — Telehealth (HOSPITAL_COMMUNITY): Payer: Self-pay

## 2023-09-27 ENCOUNTER — Other Ambulatory Visit (HOSPITAL_COMMUNITY): Payer: Self-pay

## 2023-09-27 MED ORDER — DILTIAZEM HCL ER COATED BEADS 120 MG PO CP24: 240.0000 mg | ORAL_CAPSULE | Freq: Two times a day (BID) | ORAL | 1 refills | Status: DC

## 2023-09-27 NOTE — Telephone Encounter (Signed)
 Patient called stating diltiazem  240mg  was great however having side effects daytime fatigue and unable to focus. Reached out to Sam Creighton he stated that she could do diltiazem  120mg  twice daily and give a call back on Monday.

## 2023-10-02 ENCOUNTER — Ambulatory Visit: Payer: Self-pay | Admitting: Physician Assistant

## 2023-10-02 MED ORDER — DILTIAZEM HCL ER COATED BEADS 120 MG PO CP24: 120.0000 mg | ORAL_CAPSULE | Freq: Two times a day (BID) | ORAL | 4 refills | Status: DC

## 2023-10-02 NOTE — Addendum Note (Signed)
 Addended by: Tess Fife on: 10/02/2023 02:03 PM   Modules accepted: Orders

## 2023-10-02 NOTE — Telephone Encounter (Signed)
 Patient feeling much improved on new medication regimen of Cardizem  120mg  twice a day. Her symptoms have completely resolved. Patient will call if further issues arise.

## 2023-10-15 DIAGNOSIS — D2222 Melanocytic nevi of left ear and external auricular canal: Secondary | ICD-10-CM | POA: Diagnosis not present

## 2023-10-15 DIAGNOSIS — L821 Other seborrheic keratosis: Secondary | ICD-10-CM | POA: Diagnosis not present

## 2023-10-15 DIAGNOSIS — D2221 Melanocytic nevi of right ear and external auricular canal: Secondary | ICD-10-CM | POA: Diagnosis not present

## 2023-10-15 DIAGNOSIS — L720 Epidermal cyst: Secondary | ICD-10-CM | POA: Diagnosis not present

## 2023-10-19 ENCOUNTER — Other Ambulatory Visit: Payer: Self-pay | Admitting: Cardiovascular Disease

## 2023-10-19 DIAGNOSIS — I10 Essential (primary) hypertension: Secondary | ICD-10-CM | POA: Diagnosis not present

## 2023-11-01 DIAGNOSIS — M79644 Pain in right finger(s): Secondary | ICD-10-CM | POA: Diagnosis not present

## 2023-11-01 DIAGNOSIS — E782 Mixed hyperlipidemia: Secondary | ICD-10-CM | POA: Diagnosis not present

## 2023-11-13 ENCOUNTER — Encounter: Payer: Self-pay | Admitting: Physician Assistant

## 2023-11-13 ENCOUNTER — Ambulatory Visit: Payer: Medicare Other | Attending: Physician Assistant | Admitting: Physician Assistant

## 2023-11-13 VITALS — BP 170/80 | HR 80 | Ht 69.0 in | Wt 157.0 lb

## 2023-11-13 DIAGNOSIS — I480 Paroxysmal atrial fibrillation: Secondary | ICD-10-CM | POA: Diagnosis not present

## 2023-11-13 DIAGNOSIS — I10 Essential (primary) hypertension: Secondary | ICD-10-CM

## 2023-11-13 DIAGNOSIS — I351 Nonrheumatic aortic (valve) insufficiency: Secondary | ICD-10-CM

## 2023-11-13 DIAGNOSIS — E7800 Pure hypercholesterolemia, unspecified: Secondary | ICD-10-CM

## 2023-11-13 DIAGNOSIS — I77810 Thoracic aortic ectasia: Secondary | ICD-10-CM

## 2023-11-13 NOTE — Assessment & Plan Note (Signed)
 Occasional episodes of rapid palpitations with previous monitoring showing 4% atrial fibrillation and an average heart rate of 96 bpm. Recent Hgb, Creatinine normal.  - Continue diltiazem  120 mg twice daily. - She can try atenolol  25 mg daily with an additional dose as needed for symptomatic episodes. - Refer back to AFib clinic to consider antiarrhythmic drugs if symptoms increase in frequency. - Continue Eliquis  5 mg twice daily.

## 2023-11-13 NOTE — Progress Notes (Signed)
 OFFICE NOTE:    Date:  11/13/2023  ID:  [name], [name] 09-21-1950, MRN [id number] PCP: Clarice Nottingham, MD  [facility] Providers Cardiologist:  Lonni Cash, MD        Aortic insufficiency  Possible bicuspid AoV TTE 10/19: EF 65-70, Gr 1 DD, mod AI, mild MR TTE 04/01/2019: EF 60-65, mod AI, Asc Aorta 38 mm TTE 04/15/20: EF 65-70, Mod AI TTE 04/14/2021: EF 65-70, no RWMA, GLS -22.4, normal RVSF, trivial MR, moderate AI, AV sclerosis, ascending aorta 39 mm, RAP 3 TTE 04/19/23: EF 60-65, no RWMA, mild LVH, Gr 1 DD, NL RVSF, RVSP 23.1, trivial MR, mod AI Paroxysmal atrial fibrillation Noted several days after admx w/ choledocholithiasis; DCCV initially failed in ED Intol of beta-blocker (fatigue) >> Diltiazem   Monitor 11/2019: several short SVTs (longest 18 beats) Monitor 06/2023: NSR, average heart rate 64; 4 beats NSVT; brief SVT (7 beats); 4% AFib burden with average heart rate 96 (longest 5 hours 58 minutes)-symptoms noted; PVCs/PACs <1%  Hypertension  Hyperlipidemia  Thoracic aortic aneurysm  CT 5/22: Ascending aorta 39 mm Cardiac catheterization 2014: no CAD CCTA 09/20/2020: CAC score 0, no CAD        Discussed the use of AI scribe software for clinical note transcription with the patient, who gave verbal consent to proceed. History of Present Illness Kristine Jones is a 73 y.o. female who returns for follow up of AF, AI. She was last seen in 10/2022. A monitor in 06/2023 demonstrated 4% AF. She was referred to the AF Clinic. She preferred to hold off on AAD therapy at that time. Her Diltiazem  dose was adjusted.   She is here alone. She experiences rapid palpitations about four to five times a month, often triggered by anxiety or activity, lasting briefly except for one episode lasting a couple of hours. She uses atenolol  as needed for these episodes. She has not had chest pain, shortness of breath, orthopnea, leg swelling, or syncope. Her blood pressure  readings at home are generally good, though she experiences white coat hypertension.  She recently started taking red yeast rice supplements to manage her cholesterol levels.     Review of Systems  Hematologic/Lymphatic: Does not bruise/bleed easily.  -See HPI     Studies Reviewed:      Results LABS Total cholesterol: 238 mg/dL (93/93/7974) Triglycerides: 109 mg/dL (93/93/7974) HDL: 85 mg/dL (93/93/7974) LDL: 865 mg/dL (93/93/7974) Hemoglobin: 14.2 g/dL (95/90/7974) Creatinine: 0.68 mg/dL (95/90/7974) Potassium: 4.3 mmol/L (08/22/2023) ALT: 16 U/L (08/22/2023) Thyroid  panel: Normal (08/2023)   Risk Assessment/Calculations:  CHA2DS2-VASc Score = 3   This indicates a 3.2% annual risk of stroke. The patient's score is based upon: CHF History: 0 HTN History: 1 Diabetes History: 0 Stroke History: 0 Vascular Disease History: 0 Age Score: 1 Gender Score: 1    HYPERTENSION CONTROL Vitals:   11/13/23 1522 11/13/23 1543  BP: (!) 188/80 (!) 170/80    The patient's blood pressure is elevated above target today.  In order to address the patient's elevated BP: The blood pressure is usually elevated in clinic.  Blood pressures monitored at home have been optimal.         Physical Exam:  VS:  BP (!) 170/80   Pulse 80   Ht 5' 9 (1.753 m)   Wt 157 lb (71.2 kg)   SpO2 98%   BMI 23.18 kg/m        Wt Readings from Last 3 Encounters:  11/13/23 157 lb (  71.2 kg)  07/24/23 154 lb 3.2 oz (69.9 kg)  04/29/23 155 lb (70.3 kg)    Constitutional:      Appearance: Healthy appearance. Not in distress.  Neck:     Vascular: JVD normal.  Pulmonary:     Breath sounds: Normal breath sounds. No wheezing. No rales.  Cardiovascular:     Normal rate. Regular rhythm.     Murmurs: There is a grade 1/6 systolic murmur at the URSB and ULSB.  Edema:    Peripheral edema absent.  Abdominal:     Palpations: Abdomen is soft.        Assessment and Plan:    Assessment &  Plan Nonrheumatic aortic valve insufficiency Moderate aortic insufficiency on echocardiogram from December 2024. EF is normal. - Repeat Echocardiogram scheduled for December 2025. Paroxysmal atrial fibrillation (HCC) Occasional episodes of rapid palpitations with previous monitoring showing 4% atrial fibrillation and an average heart rate of 96 bpm. Recent Hgb, Creatinine normal.  - Continue diltiazem  120 mg twice daily. - She can try atenolol  25 mg daily with an additional dose as needed for symptomatic episodes. - Refer back to AFib clinic to consider antiarrhythmic drugs if symptoms increase in frequency. - Continue Eliquis  5 mg twice daily.  Essential hypertension Blood pressure readings are typically optimal at home but elevated in clinical settings, consistent with white coat hypertension. - Continue Diltiazem  120 mg twice daily, Irbesartan  75 mg once daily  - Continue to monitor blood pressure  Pure hypercholesterolemia CAC score 0 in the past. I would aim for an LDL < 100 for her. Luckily, her HDL is 85. She just started on Red Yeast Rice. Hopefully this will get her to goal. 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 TTE in 04/2023 with normal sized aortic root and ascending aorta. Will continue to monitor with serial echocardiograms.         Dispo:  Return in about 1 year (around 11/12/2024) for Routine Follow Up, w/ Dr. Verlin, or Glendia Ferrier, PA-C.  Signed, Glendia Ferrier, PA-C

## 2023-11-13 NOTE — Assessment & Plan Note (Signed)
 Moderate aortic insufficiency on echocardiogram from December 2024. EF is normal. - Repeat Echocardiogram scheduled for December 2025.

## 2023-11-13 NOTE — Patient Instructions (Signed)
 Medication Instructions:  START TAKING ATENOLOL  DAILY (LET US  KNOW IF ANYTHING CHANGES) *If you need a refill on your cardiac medications before your next appointment, please call your pharmacy*  Lab Work: NONE If you have labs (blood work) drawn today and your tests are completely normal, you will receive your results only by: MyChart Message (if you have MyChart) OR A paper copy in the mail If you have any lab test that is abnormal or we need to change your treatment, we will call you to review the results.  Testing/Procedures: NONE  Follow-Up: At [facility], you and your health needs are our priority.  As part of our continuing mission to provide you with exceptional heart care, our providers are all part of one team.  This team includes your primary Cardiologist (physician) and Advanced Practice Providers or APPs (Physician Assistants and Nurse Practitioners) who all work together to provide you with the care you need, when you need it.  Your next appointment:   1 year(s)  Provider:   Lonni Cash, MD or Glendia Ferrier, PA-C

## 2023-11-13 NOTE — Assessment & Plan Note (Signed)
 39 mm on CT in 2022. TTE in 04/2023 with normal sized aortic root and ascending aorta. Will continue to monitor with serial echocardiograms.

## 2023-11-13 NOTE — Assessment & Plan Note (Signed)
 CAC score 0 in the past. I would aim for an LDL < 100 for her. Luckily, her HDL is 85. She just started on Red Yeast Rice. Hopefully this will get her to goal.

## 2023-11-20 DIAGNOSIS — H6120 Impacted cerumen, unspecified ear: Secondary | ICD-10-CM | POA: Diagnosis not present

## 2023-12-02 ENCOUNTER — Other Ambulatory Visit: Payer: Self-pay | Admitting: Cardiovascular Disease

## 2023-12-02 DIAGNOSIS — I4891 Unspecified atrial fibrillation: Secondary | ICD-10-CM

## 2023-12-03 NOTE — Telephone Encounter (Signed)
 Prescription refill request for Eliquis  received. Indication:afib Last office visit:7/25 Scr:0.68  4/25 Age: 73 Weight:71.2  kg  Prescription refilled

## 2023-12-06 DIAGNOSIS — H5212 Myopia, left eye: Secondary | ICD-10-CM | POA: Diagnosis not present

## 2023-12-11 ENCOUNTER — Telehealth (HOSPITAL_COMMUNITY): Payer: Self-pay

## 2023-12-11 MED ORDER — DILTIAZEM HCL ER COATED BEADS 120 MG PO CP24: 120.0000 mg | ORAL_CAPSULE | Freq: Every day | ORAL | Status: DC

## 2023-12-11 MED ORDER — ATENOLOL 25 MG PO TABS: 25.0000 mg | ORAL_TABLET | Freq: Every day | ORAL | Status: DC

## 2023-12-11 NOTE — Telephone Encounter (Signed)
 Patient  called stating she is really sensitive to taking Diltiazem  120 twice daily by the second dose she is a walking zombie and having to take a nap. Reach out to Thom he stated she can try taking diltiazem  once daily in the evening and taking the atenolol  25 mg once daily in the morning and give us  a call back next Tuesday or Wednesday and let us  know how that works. Patient agree with the decision.

## 2023-12-18 DIAGNOSIS — M810 Age-related osteoporosis without current pathological fracture: Secondary | ICD-10-CM | POA: Diagnosis not present

## 2023-12-20 DIAGNOSIS — M79644 Pain in right finger(s): Secondary | ICD-10-CM | POA: Diagnosis not present

## 2023-12-20 DIAGNOSIS — E782 Mixed hyperlipidemia: Secondary | ICD-10-CM | POA: Diagnosis not present

## 2023-12-21 DIAGNOSIS — H938X1 Other specified disorders of right ear: Secondary | ICD-10-CM | POA: Diagnosis not present

## 2023-12-21 DIAGNOSIS — R21 Rash and other nonspecific skin eruption: Secondary | ICD-10-CM | POA: Diagnosis not present

## 2023-12-24 ENCOUNTER — Other Ambulatory Visit: Payer: Self-pay | Admitting: *Deleted

## 2023-12-24 MED ORDER — ATENOLOL 25 MG PO TABS: 25.0000 mg | ORAL_TABLET | Freq: Every day | ORAL | 11 refills | Status: DC

## 2023-12-27 ENCOUNTER — Other Ambulatory Visit: Payer: Self-pay

## 2023-12-27 MED ORDER — ATENOLOL 25 MG PO TABS: 25.0000 mg | ORAL_TABLET | Freq: Every day | ORAL | 3 refills | Status: DC

## 2024-01-01 ENCOUNTER — Telehealth (HOSPITAL_COMMUNITY): Payer: Self-pay | Admitting: *Deleted

## 2024-01-01 NOTE — Telephone Encounter (Signed)
 Pt called reported update of meds taking atenolol  25 mg in A.M. and Diltiazem  120 mg  of the evening the first week she felt better with more energy. Starting to feel tired again HR 55-61 BP 122/67. Spoke with Thom Heinrich P.A. and suggested pt to decrease atenolol  to half tablet 12.5 mg. If no improvement to call back make appt to discuss further med changes. Pt understands and agrees with plan. She will call back with update in about one week.

## 2024-01-01 NOTE — Telephone Encounter (Signed)
-----   Message from Terra Pac sent at 01/01/2024 10:47 AM EDT ----- Regarding: RE: med follow up We can always try less medication at the expense or possible risk of more Afib/palpitations. She can try decreasing the atenolol  to half tablet daily but as stated can increase palpitations. These two medications are commonly prescribed to help with heart rate control / palpitations. We can stop them both ultimately, but then the risk is more Afib / increased palpitations. If she wishes to discuss further in person, happy to see her for an appointment. ----- Message ----- From: Janel Nancy SAUNDERS, RN Sent: 12/31/2023   3:35 PM EDT To: Pac JINNY Terra, PA-C Subject: med follow up                                  Pt called reported update of meds taking Diltiazem  of the evening the first week she felt better with more energy. Starting to feel tired again HR 55-61 BP 122/67. Do you want to make any further changes or does she need any more follow appt ? Thanks -Nancy

## 2024-01-07 NOTE — Telephone Encounter (Signed)
 Pt called reporting that med changes has not helped her to feel better. HR 58-80's and BP 124/67. Appt made to discuss meds with provider.

## 2024-01-18 ENCOUNTER — Other Ambulatory Visit: Payer: Self-pay | Admitting: Cardiovascular Disease

## 2024-01-22 DIAGNOSIS — L820 Inflamed seborrheic keratosis: Secondary | ICD-10-CM | POA: Diagnosis not present

## 2024-01-22 DIAGNOSIS — D2239 Melanocytic nevi of other parts of face: Secondary | ICD-10-CM | POA: Diagnosis not present

## 2024-01-23 ENCOUNTER — Ambulatory Visit (HOSPITAL_COMMUNITY)
Admission: RE | Admit: 2024-01-23 | Discharge: 2024-01-23 | Disposition: A | Discharge status: Home / Self Care | Source: Ambulatory Visit | Attending: Internal Medicine | Admitting: Internal Medicine

## 2024-01-23 ENCOUNTER — Encounter (HOSPITAL_COMMUNITY): Payer: Self-pay | Admitting: Internal Medicine

## 2024-01-23 VITALS — BP 166/100 | HR 66 | Ht 69.0 in | Wt 156.2 lb

## 2024-01-23 DIAGNOSIS — D6869 Other thrombophilia: Secondary | ICD-10-CM

## 2024-01-23 DIAGNOSIS — I480 Paroxysmal atrial fibrillation: Secondary | ICD-10-CM | POA: Diagnosis not present

## 2024-01-23 NOTE — Progress Notes (Signed)
 Primary Care Physician: Clarice Nottingham, MD Primary Cardiologist: Lonni Cash, MD Electrophysiologist: None     Referring Physician: Glendia Ferrier, PA-C     Kristine Jones is a 73 y.o. female with a history of HTN, ascending aorta dilation, aortic regurgitation, and paroxysmal atrial fibrillation who presents for consultation in the [facility]. Cardiac monitor worn 06/2023 showed 4% Afib burden. Patient is on Eliquis  5 mg BID for a CHADS2VASC score of 3.  On evaluation today, she is currently in NSR. She is here today to discuss monitor results. She notes that previously was on diltiazem  180 mg AM and 120 mg PM. She feels that controlled her palpitations much better last year. She notes the 120 mg dosage was removed due to her noting fatigue. Unfortunately, she notes increased palpitations for the past few months. No missed doses of Eliquis .   On follow up 01/23/24, patient is currently in NSR. Patient has noted to have side effects from diltiazem  and atenolol  feeling very tired. Change recommended by Glendia Ferrier, PA-C, to transition atenolol  to 12.5 mg PRN and continue diltiazem  at bedtime.   Today, she denies symptoms of chest pain, shortness of breath, orthopnea, PND, lower extremity edema, dizziness, presyncope, syncope, snoring, daytime somnolence, bleeding, or neurologic sequela. The patient is tolerating medications without difficulties and is otherwise without complaint today.   she has a BMI of Body mass index is 23.07 kg/m.SABRA Filed Weights   01/23/24 1438  Weight: 70.9 kg     Current Outpatient Medications  Medication Sig Dispense Refill   ALPRAZolam (XANAX) 0.25 MG tablet Take 0.25 mg by mouth as needed for anxiety.     apixaban  (ELIQUIS ) 5 MG TABS tablet TAKE 1 TABLET BY MOUTH 2 TIMES A DAY 180 tablet 1   atenolol  (TENORMIN ) 25 MG tablet Take 1 tablet (25 mg total) by mouth daily. 90 tablet 3   Calcium  Carbonate-Vitamin D   (CALCIUM -VITAMIN D3) 600-200 MG-UNIT TABS Take 2 tablets by mouth daily.     Cholecalciferol  (VITAMIN D ) 1000 UNITS capsule Take 1,000 Units by mouth daily.     denosumab  (PROLIA ) 60 MG/ML SOSY injection every 6 (six) months.     estradiol (ESTRACE) 0.1 MG/GM vaginal cream Place 1 Applicatorful vaginally as needed (vaginal discomfort).      fish oil-omega-3 fatty acids  1000 MG capsule Take 1 g by mouth daily.     irbesartan  (AVAPRO ) 75 MG tablet TAKE 1 TABLET BY MOUTH DAILY 90 tablet 3   multivitamin (THERAGRAN) per tablet Take 1 tablet by mouth daily.     Red Yeast Rice 600 MG CAPS Take 2 capsules by mouth daily at 6 (six) AM.     sertraline  (ZOLOFT ) 50 MG tablet Take 1 1/2 tablet by mouth (75 mg total) daily     No current facility-administered medications for this encounter.    Atrial Fibrillation Management history:  Previous antiarrhythmic drugs: none Previous cardioversions: none Previous ablations: none Anticoagulation history: Eliquis    ROS- All systems are reviewed and negative except as per the HPI above.  Physical Exam: BP (!) 166/100   Pulse 66   Ht 5' 9 (1.753 m)   Wt 70.9 kg   BMI 23.07 kg/m   GEN- The patient is well appearing, alert and oriented x 3 today.   Neck - no JVD or carotid bruit noted Lungs- Clear to ausculation bilaterally, normal work of breathing Heart- Regular rate and rhythm, no murmurs, rubs or gallops, PMI not laterally displaced Extremities-  no clubbing, cyanosis, or edema Skin - no rash or ecchymosis noted   EKG today demonstrates  Vent. rate 66 BPM PR interval 160 ms QRS duration 98 ms QT/QTcB 408/427 ms P-R-T axes 59 -10 73 Normal sinus rhythm Right atrial enlargement Moderate voltage criteria for LVH, may be normal variant ( R in aVL , Cornell product ) Anteroseptal infarct (cited on or before 08-Nov-2022) Abnormal ECG When compared with ECG of 24-Jul-2023 14:02, No significant change was found  Echo 04/19/23 demonstrated  1.  Left ventricular ejection fraction, by estimation, is 60 to 65%. The  left ventricle has normal function. The left ventricle has no regional  wall motion abnormalities. There is mild concentric left ventricular  hypertrophy. Left ventricular diastolic  parameters are consistent with Grade I diastolic dysfunction (impaired  relaxation). The average left ventricular global longitudinal strain is  -20.6 %. The global longitudinal strain is normal.   2. Right ventricular systolic function is normal. The right ventricular  size is normal. There is normal pulmonary artery systolic pressure. The  estimated right ventricular systolic pressure is 23.1 mmHg.   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 Trivial mitral valve regurgitation.   4. The aortic valve is tricuspid. Aortic valve regurgitation is moderate.  Aortic regurgitation PHT measures 405 msec.   5. The inferior vena cava is normal in size with greater than 50%  respiratory variability, suggesting right atrial pressure of 3 mmHg.   Cardiac monitor 06/2023: Patch Wear Time:  13 days and 23 hours (2025-02-07T10:34:44-0500 to 2025-02-21T10:34:36-0500)   Patient had a min HR of 39 bpm, max HR of 194 bpm, and avg HR of 64 bpm.  Predominant underlying rhythm was Sinus Rhythm.  1 run of Ventricular Tachycardia occurred lasting 4 beats with a max rate of 194 bpm (avg 173 bpm).  1 run of Supraventricular Tachycardia occurred lasting 7 beats with a max rate of 118 bpm (avg 109 bpm).  Atrial Fibrillation/Flutter occurred (4% burden), ranging from 50-173 bpm (avg of 96 bpm), the longest lasting 5 hours 58 mins with an avg rate of 102 bpm.  Atrial Fibrillation/Flutter was detected within +/- 45 seconds of symptomatic patient event(s).  Isolated SVEs were rare (<1.0%), SVE Couplets were rare (<1.0%), and no SVE Triplets were present.  Isolated VEs were rare (<1.0%), VE Couplets were rare (<1.0%), and no VE Triplets were present.   ASSESSMENT &  PLAN CHA2DS2-VASc Score = 3  The patient's score is based upon: CHF History: 0 HTN History: 1 Diabetes History: 0 Stroke History: 0 Vascular Disease History: 0 Age Score: 1 Gender Score: 1       ASSESSMENT AND PLAN: Paroxysmal Atrial Fibrillation (ICD10:  I48.0) The patient's CHA2DS2-VASc score is 3, indicating a 3.2% annual risk of stroke.    Patient is currently in NSR. She does acknowledge that atenolol  and diltiazem  seem to help with her rate control and have suppressed Afib burden. However, she notes fatigue that is affecting her quality of life. We discussed Multaq and Tikosyn and ablation as options for rhythm control, should stopping rate control increase Afib burden. After discussion with patient, she will discontinue diltiazem  to see if it helps with her fatigue. She will continue atenolol  12.5 mg daily (takes in the morning). Recommended Apple watch or Kardiamobile, as her fitbit watch is an older version. Patient advised to contact clinic with update next week.   Secondary Hypercoagulable State (ICD10:  D68.69) The patient is at significant risk for stroke/thromboembolism based upon her CHA2DS2-VASc Score  of 3.  Continue Apixaban  (Eliquis ).  Continue Eliquis .  Hypertension Elevated today in office; patient notes her BP is always elevated in office. Recommend trending at home.     Follow up 6 months Afib clinic.    Terra Pac, PA-C  Afib Clinic [facility] [address] [place], [place] [address] [phone]

## 2024-01-23 NOTE — Patient Instructions (Signed)
 Stop diltiazem

## 2024-02-05 DIAGNOSIS — M170 Bilateral primary osteoarthritis of knee: Secondary | ICD-10-CM | POA: Diagnosis not present

## 2024-02-05 DIAGNOSIS — M1180 Other specified crystal arthropathies, unspecified site: Secondary | ICD-10-CM | POA: Diagnosis not present

## 2024-02-05 DIAGNOSIS — M79671 Pain in right foot: Secondary | ICD-10-CM | POA: Diagnosis not present

## 2024-02-25 DIAGNOSIS — M79674 Pain in right toe(s): Secondary | ICD-10-CM | POA: Diagnosis not present

## 2024-03-07 DIAGNOSIS — Z23 Encounter for immunization: Secondary | ICD-10-CM | POA: Diagnosis not present

## 2024-03-21 MED ORDER — DILTIAZEM HCL ER COATED BEADS 180 MG PO CP24: 180.0000 mg | ORAL_CAPSULE | Freq: Every day | ORAL | 3 refills | Status: AC

## 2024-03-21 NOTE — Addendum Note (Signed)
 Addended byBETHA FERRIER, GLENDIA T on: 03/21/2024 12:49 PM   Modules accepted: Orders

## 2024-04-01 DIAGNOSIS — N952 Postmenopausal atrophic vaginitis: Secondary | ICD-10-CM | POA: Diagnosis not present

## 2024-04-01 DIAGNOSIS — Z803 Family history of malignant neoplasm of breast: Secondary | ICD-10-CM | POA: Diagnosis not present

## 2024-04-01 DIAGNOSIS — Z1331 Encounter for screening for depression: Secondary | ICD-10-CM | POA: Diagnosis not present

## 2024-04-01 DIAGNOSIS — N3281 Overactive bladder: Secondary | ICD-10-CM | POA: Diagnosis not present

## 2024-04-01 DIAGNOSIS — Z01411 Encounter for gynecological examination (general) (routine) with abnormal findings: Secondary | ICD-10-CM | POA: Diagnosis not present

## 2024-04-08 DIAGNOSIS — Z1231 Encounter for screening mammogram for malignant neoplasm of breast: Secondary | ICD-10-CM | POA: Diagnosis not present

## 2024-04-21 ENCOUNTER — Ambulatory Visit (HOSPITAL_COMMUNITY)
Admission: RE | Admit: 2024-04-21 | Discharge: 2024-04-21 | Attending: Physician Assistant | Admitting: Physician Assistant

## 2024-04-21 DIAGNOSIS — I351 Nonrheumatic aortic (valve) insufficiency: Secondary | ICD-10-CM | POA: Diagnosis not present

## 2024-04-21 LAB — ECHOCARDIOGRAM COMPLETE
Area-P 1/2: 2.5 cm2
P 1/2 time: 507 ms
S' Lateral: 2.8 cm

## 2024-04-22 ENCOUNTER — Ambulatory Visit: Payer: Self-pay | Admitting: Physician Assistant

## 2024-04-22 DIAGNOSIS — I351 Nonrheumatic aortic (valve) insufficiency: Secondary | ICD-10-CM

## 2024-05-13 ENCOUNTER — Other Ambulatory Visit: Payer: Self-pay | Admitting: Cardiovascular Disease

## 2024-05-13 DIAGNOSIS — I4891 Unspecified atrial fibrillation: Secondary | ICD-10-CM

## 2024-05-13 NOTE — Telephone Encounter (Signed)
 Prescription refill request for Eliquis  received. Indication:afib Last office visit:9/25 Scr: 0.68  4/25 Age:73 Weight:70.9  kg  Prescription refilled

## 2024-06-13 MED ORDER — IRBESARTAN 75 MG PO TABS: 37.5000 mg | ORAL_TABLET | Freq: Every day | ORAL | 3 refills | Status: AC

## 2024-06-13 NOTE — Addendum Note (Signed)
 Addended byBETHA FERRIER, GLENDIA T on: 06/13/2024 11:23 AM   Modules accepted: Orders

## 2024-06-17 ENCOUNTER — Other Ambulatory Visit: Payer: Self-pay

## 2024-06-17 ENCOUNTER — Ambulatory Visit

## 2024-06-17 ENCOUNTER — Telehealth: Payer: Self-pay

## 2024-06-17 ENCOUNTER — Telehealth: Payer: Self-pay | Admitting: Family Medicine

## 2024-06-17 ENCOUNTER — Ambulatory Visit: Admitting: Family Medicine

## 2024-06-17 VITALS — BP 138/70 | HR 95 | Ht 69.0 in | Wt 157.0 lb

## 2024-06-17 DIAGNOSIS — M79671 Pain in right foot: Secondary | ICD-10-CM

## 2024-06-17 DIAGNOSIS — G8929 Other chronic pain: Secondary | ICD-10-CM | POA: Diagnosis not present

## 2024-06-17 DIAGNOSIS — M25571 Pain in right ankle and joints of right foot: Secondary | ICD-10-CM | POA: Diagnosis not present

## 2024-06-17 DIAGNOSIS — M25561 Pain in right knee: Secondary | ICD-10-CM | POA: Diagnosis not present

## 2024-06-17 MED ORDER — HYDROCODONE-ACETAMINOPHEN 5-325 MG PO TABS: 1.0000 | ORAL_TABLET | Freq: Four times a day (QID) | ORAL | 0 refills | Status: AC | PRN

## 2024-06-17 NOTE — Telephone Encounter (Signed)
 Hydrocodone  sent to [facility]

## 2024-06-17 NOTE — Addendum Note (Signed)
 Addended by: JOANE ARTIST RAMAN on: 06/17/2024 04:07 PM   Modules accepted: Orders

## 2024-06-17 NOTE — Progress Notes (Signed)
 "        I, Claretha Schimke am a scribe for Dr. Artist Lloyd, MD.  Kristine Jones is a 74 y.o. female who presents to [facility] today for exacerbation of her R knee pain. Pt was last seen by Dr. Lloyd on 01/08/23 and completed the Orthovisc series, 3/3, in her R knee.  Today, pt reports that the right knee is hurting today. Had a horrible night 9 out of 10 pain. Was awake about 4 hours. Used some tylenol  and that helped. Today it is sore but not as bad as last night.   Additionally she has pain in her right midfoot.  She notes this is worse with standing and activity.  Pain is better with rest.  Dx testing: 11/02/22 Labs 10/24/22 R knee XR 08/30/21 R knee XR  Pertinent review of systems: No fevers or chills  Relevant historical information: Atrial fibrillation.  Hypertension.  Anticoagulation with Eliquis    Exam:  BP 138/70   Pulse 95   Ht 5' 9 (1.753 m)   Wt 157 lb (71.2 kg)   SpO2 98%   BMI 23.18 kg/m  General: Well Developed, well nourished, and in no acute distress.   MSK: Right knee genu valgus appearance no large effusion. Decreased range of motion lacks full extension and flexion with pain. Tender palpation medial and lateral joint line. No definitive laxity although patient does have guarding with exam.   Right foot: Bunion and bunionette are present.  Tender palpation dorsal midfoot along first ray at tarsometatarsal joint.  Normal ankle motion.    Lab and Radiology Results  Procedure: Real-time Ultrasound Guided Injection of right knee joint anterior medial approach Device: Philips Affiniti 50G/GE Logiq Images permanently stored and available for review in PACS Verbal informed consent obtained.  Discussed risks and benefits of procedure. Warned about infection, bleeding, hyperglycemia damage to structures among others. Patient expresses understanding and agreement Time-out conducted.   Noted no overlying erythema, induration, or  other signs of local infection.   Skin prepped in a sterile fashion.   Local anesthesia: Topical Ethyl chloride.   With sterile technique and under real time ultrasound guidance: 40 mg of Kenalog  and 2 mL of Marcaine injected into knee joint. Fluid seen entering the joint capsule.   Completed without difficulty   Pain immediately resolved suggesting accurate placement of the medication.   Advised to call if fevers/chills, erythema, induration, drainage, or persistent bleeding.   Images permanently stored and available for review in the ultrasound unit.  Impression: Technically successful ultrasound guided injection.   X-ray images right knee and right foot obtained today personally and independently interpreted.  Right knee: Severe degenerative changes in all 3 compartments worse in the lateral compartment.  No acute fractures are visible.  Right foot: Moderate DJD across midfoot.  Bunion and bunionette deformities are present.  Posterior calcaneal heel spur is present  Await formal radiology review    Assessment and Plan: 74 y.o. female with right knee pain due to exacerbation of DJD.  Plan for steroid injection today.  A year and a half ago she had significant benefit from gel injection.  Will work on authorization again for hyaluronic acid injections and anticipate doing those in the future.  Right foot pain due to DJD across midfoot.  Consider injection in the future.  For now work on arch support using high-quality shoes as well as arch straps and Voltaren gel.  Consider steroid injection in the future  if needed.   PDMP reviewed during this encounter. Orders Placed This Encounter  Procedures   US  LIMITED JOINT SPACE STRUCTURES LOW RIGHT(NO LINKED CHARGES)    Reason for Exam (SYMPTOM  OR DIAGNOSIS REQUIRED):   knee pain    Preferred imaging location?:   [facility] Knee AP/LAT W/Sunrise Right    Standing Status:   Future    Number of Occurrences:    1    Expiration Date:   07/15/2024    Reason for Exam (SYMPTOM  OR DIAGNOSIS REQUIRED):   right knee pain    Preferred imaging location?:   [facility]   DG Foot Complete Right    Standing Status:   Future    Number of Occurrences:   1    Expiration Date:   06/17/2025    Reason for Exam (SYMPTOM  OR DIAGNOSIS REQUIRED):   right foot pain    Preferred imaging location?:   [facility]   No orders of the defined types were placed in this encounter.    Discussed warning signs or symptoms. Please see discharge instructions. Patient expresses understanding.   The above documentation has been reviewed and is accurate and complete Artist Lloyd, M.D.   "

## 2024-06-17 NOTE — Telephone Encounter (Signed)
 Pt states she checked after her visit today and does NOT have any hydrocodone  at home. Can he call in to [facility]?

## 2024-06-17 NOTE — Telephone Encounter (Signed)
 Sent to clinical review Request [phone]

## 2024-06-19 ENCOUNTER — Ambulatory Visit: Payer: Self-pay | Admitting: Family Medicine

## 2024-06-19 NOTE — Progress Notes (Signed)
 Right foot xray shows mild arthritis in the midfoot where your pain is located. You do also have some arthritis in the big toe joint.

## 2024-06-19 NOTE — Progress Notes (Signed)
Right knee x-ray shows medium arthritis

## 2024-07-22 ENCOUNTER — Ambulatory Visit (HOSPITAL_COMMUNITY): Admitting: Internal Medicine
